# Patient Record
Sex: Female | Born: 1970 | Race: Black or African American | Hispanic: No | Smoking: Current every day smoker
Health system: Southern US, Community
[De-identification: ages and names within clinical notes are randomized; demographics above are authoritative.]

## PROBLEM LIST (undated history)

## (undated) DIAGNOSIS — F419 Anxiety disorder, unspecified: Secondary | ICD-10-CM

## (undated) DIAGNOSIS — I82409 Acute embolism and thrombosis of unspecified deep veins of unspecified lower extremity: Secondary | ICD-10-CM

## (undated) DIAGNOSIS — M549 Dorsalgia, unspecified: Secondary | ICD-10-CM

---

## 1998-01-23 ENCOUNTER — Emergency Department (HOSPITAL_COMMUNITY): Admission: EM | Admit: 1998-01-23 | Discharge: 1998-01-23 | Payer: Self-pay | Admitting: Emergency Medicine

## 1999-07-05 ENCOUNTER — Inpatient Hospital Stay (HOSPITAL_COMMUNITY): Admission: AD | Admit: 1999-07-05 | Discharge: 1999-07-05 | Payer: Self-pay | Admitting: Obstetrics & Gynecology

## 1999-07-22 ENCOUNTER — Emergency Department (HOSPITAL_COMMUNITY): Admission: EM | Admit: 1999-07-22 | Discharge: 1999-07-22 | Payer: Self-pay | Admitting: Emergency Medicine

## 2000-05-27 ENCOUNTER — Ambulatory Visit (HOSPITAL_COMMUNITY): Admission: RE | Admit: 2000-05-27 | Discharge: 2000-05-27 | Payer: Self-pay | Admitting: Family Medicine

## 2000-05-27 ENCOUNTER — Inpatient Hospital Stay (HOSPITAL_COMMUNITY): Admission: AD | Admit: 2000-05-27 | Discharge: 2000-05-27 | Payer: Self-pay | Admitting: Obstetrics

## 2000-05-29 ENCOUNTER — Inpatient Hospital Stay (HOSPITAL_COMMUNITY): Admission: AD | Admit: 2000-05-29 | Discharge: 2000-05-29 | Payer: Self-pay | Admitting: Obstetrics & Gynecology

## 2000-06-02 ENCOUNTER — Encounter: Payer: Self-pay | Admitting: Obstetrics & Gynecology

## 2000-06-02 ENCOUNTER — Inpatient Hospital Stay (HOSPITAL_COMMUNITY): Admission: AD | Admit: 2000-06-02 | Discharge: 2000-06-02 | Payer: Self-pay | Admitting: Obstetrics & Gynecology

## 2000-06-07 ENCOUNTER — Inpatient Hospital Stay (HOSPITAL_COMMUNITY): Admission: AD | Admit: 2000-06-07 | Discharge: 2000-06-07 | Payer: Self-pay | Admitting: Obstetrics

## 2000-06-09 ENCOUNTER — Inpatient Hospital Stay (HOSPITAL_COMMUNITY): Admission: AD | Admit: 2000-06-09 | Discharge: 2000-06-09 | Payer: Self-pay | Admitting: *Deleted

## 2000-06-09 ENCOUNTER — Encounter: Payer: Self-pay | Admitting: *Deleted

## 2000-06-11 ENCOUNTER — Observation Stay (HOSPITAL_COMMUNITY): Admission: AD | Admit: 2000-06-11 | Discharge: 2000-06-12 | Payer: Self-pay | Admitting: Obstetrics & Gynecology

## 2000-06-15 ENCOUNTER — Inpatient Hospital Stay (HOSPITAL_COMMUNITY): Admission: AD | Admit: 2000-06-15 | Discharge: 2000-06-15 | Payer: Self-pay | Admitting: Obstetrics

## 2001-02-03 ENCOUNTER — Ambulatory Visit (HOSPITAL_COMMUNITY): Admission: RE | Admit: 2001-02-03 | Discharge: 2001-02-03 | Payer: Self-pay | Admitting: Family Medicine

## 2001-02-03 ENCOUNTER — Encounter: Payer: Self-pay | Admitting: Family Medicine

## 2001-03-16 ENCOUNTER — Emergency Department (HOSPITAL_COMMUNITY): Admission: EM | Admit: 2001-03-16 | Discharge: 2001-03-16 | Payer: Self-pay | Admitting: Emergency Medicine

## 2001-05-18 ENCOUNTER — Ambulatory Visit (HOSPITAL_COMMUNITY): Admission: RE | Admit: 2001-05-18 | Discharge: 2001-05-18 | Payer: Self-pay

## 2001-08-20 ENCOUNTER — Emergency Department (HOSPITAL_COMMUNITY): Admission: EM | Admit: 2001-08-20 | Discharge: 2001-08-21 | Payer: Self-pay

## 2001-08-25 ENCOUNTER — Encounter: Payer: Self-pay | Admitting: Emergency Medicine

## 2001-08-25 ENCOUNTER — Emergency Department (HOSPITAL_COMMUNITY): Admission: EM | Admit: 2001-08-25 | Discharge: 2001-08-25 | Payer: Self-pay | Admitting: Emergency Medicine

## 2003-01-08 ENCOUNTER — Emergency Department (HOSPITAL_COMMUNITY): Admission: EM | Admit: 2003-01-08 | Discharge: 2003-01-08 | Payer: Self-pay | Admitting: Emergency Medicine

## 2003-02-17 ENCOUNTER — Emergency Department (HOSPITAL_COMMUNITY): Admission: AD | Admit: 2003-02-17 | Discharge: 2003-02-17 | Payer: Self-pay | Admitting: Emergency Medicine

## 2003-02-17 ENCOUNTER — Encounter: Payer: Self-pay | Admitting: Emergency Medicine

## 2004-03-14 ENCOUNTER — Emergency Department (HOSPITAL_COMMUNITY): Admission: EM | Admit: 2004-03-14 | Discharge: 2004-03-14 | Payer: Self-pay | Admitting: Family Medicine

## 2004-03-25 ENCOUNTER — Emergency Department (HOSPITAL_COMMUNITY): Admission: EM | Admit: 2004-03-25 | Discharge: 2004-03-25 | Payer: Self-pay | Admitting: Family Medicine

## 2006-04-01 ENCOUNTER — Emergency Department (HOSPITAL_COMMUNITY): Admission: EM | Admit: 2006-04-01 | Discharge: 2006-04-01 | Payer: Self-pay | Admitting: *Deleted

## 2015-05-03 ENCOUNTER — Emergency Department (HOSPITAL_COMMUNITY)
Admission: EM | Admit: 2015-05-03 | Discharge: 2015-05-03 | Disposition: A | Discharge status: Home / Self Care | Payer: Self-pay | Attending: Emergency Medicine | Admitting: Emergency Medicine

## 2015-05-03 ENCOUNTER — Encounter (HOSPITAL_COMMUNITY): Payer: Self-pay | Admitting: Emergency Medicine

## 2015-05-03 DIAGNOSIS — M5117 Intervertebral disc disorders with radiculopathy, lumbosacral region: Secondary | ICD-10-CM | POA: Insufficient documentation

## 2015-05-03 DIAGNOSIS — Z720 Tobacco use: Secondary | ICD-10-CM | POA: Insufficient documentation

## 2015-05-03 DIAGNOSIS — M5116 Intervertebral disc disorders with radiculopathy, lumbar region: Secondary | ICD-10-CM

## 2015-05-03 DIAGNOSIS — R45851 Suicidal ideations: Secondary | ICD-10-CM | POA: Insufficient documentation

## 2015-05-03 HISTORY — DX: Anxiety disorder, unspecified: F41.9

## 2015-05-03 MED ORDER — CYCLOBENZAPRINE HCL 10 MG PO TABS
10.0000 mg | ORAL_TABLET | Freq: Three times a day (TID) | ORAL | Status: DC | PRN
Start: 2015-05-03 — End: 2016-11-11

## 2015-05-03 MED ORDER — CYCLOBENZAPRINE HCL 10 MG PO TABS
5.0000 mg | ORAL_TABLET | Freq: Once | ORAL | Status: AC
  Administered 2015-05-03: 5 mg via ORAL
  Filled 2015-05-03: qty 1

## 2015-05-03 MED ORDER — PREDNISONE 20 MG PO TABS: 20.0000 mg | ORAL_TABLET | Freq: Every day | ORAL | Status: DC

## 2015-05-03 MED ORDER — KETOROLAC TROMETHAMINE 60 MG/2ML IM SOLN
60.0000 mg | Freq: Once | INTRAMUSCULAR | Status: AC
  Administered 2015-05-03: 60 mg via INTRAMUSCULAR
  Filled 2015-05-03: qty 2

## 2015-05-03 MED ORDER — HYDROMORPHONE HCL 1 MG/ML IJ SOLN
1.0000 mg | Freq: Once | INTRAMUSCULAR | Status: AC
  Administered 2015-05-03: 1 mg via INTRAVENOUS
  Filled 2015-05-03: qty 1

## 2015-05-03 MED ORDER — TRAMADOL HCL 50 MG PO TABS: 50.0000 mg | ORAL_TABLET | Freq: Two times a day (BID) | ORAL | Status: DC | PRN

## 2015-05-03 NOTE — ED Notes (Signed)
From home via [facility] for chronic back pain, radiating down left leg, HR 130, 158/80  500 ml NS 150 mcg fentanyl

## 2015-05-03 NOTE — Discharge Instructions (Signed)
Herniated Disk °A herniated disk occurs when a disk in your spine bulges out too far. This condition is also called a ruptured disk or slipped disk. Your spine (backbone) is made up of bones called vertebrae. Between each pair of vertebrae is an oval disk with a soft, spongy center that acts as a shock absorber when you move. The spongy center is surrounded by a tough outer ring. °When you have a herniated disk, the spongy center of the disk bulges out or ruptures through the outer ring. A herniated disk can press on a nerve between your vertebrae and cause pain. A herniated disk can occur anywhere in your back or neck area, but the lower back is the most common spot. °CAUSES  °In many cases, a herniated disk occurs just from getting older. As you age, the spongy insides of your disks tend to shrink and dry out. A herniated disk can result from gradual wear and tear. Injury or sudden strain can also cause a herniated disk.  °RISK FACTORS °Aging is the main risk factor for a herniated disk. Other risk factors include: °· Being a man between the ages of 30 and 50 years. °· Having a job that requires heavy lifting, bending, or twisting. °· Having a job that requires long hours of driving. °· Not getting enough exercise. °· Being overweight. °· Smoking. °SIGNS AND SYMPTOMS  °Signs and symptoms depend on which disk is herniated. °· For a herniated disk in the lower back, you may have sharp pain in: °¨ One part of your leg, hip, or buttocks. °¨ The back of your calf. °¨ The top or sole of your foot (sciatica).   °· For a herniated disk in the neck, you may feel pain: °¨ When you move your neck. °¨ Near or over your shoulder blade. °¨ That moves to your upper arm, forearm, or fingers.   °· You may also have muscle weakness. It may be hard to: °¨ Lift your leg or arm. °¨ Stand on your toes. °¨ Squeeze tightly with one of your hands. °· Other symptoms can include: °¨ Numbness or tingling in the affected areas of your  body. °¨ Loss of bladder or bowel control. This is a rare but serious sign of a severe herniated disk in the lower back. °DIAGNOSIS  °Your health care provider will do a physical exam. During this exam, you may have to move certain body parts or assume various positions. For example, your health care provider may do the straight-leg test. This is a good way to test for a herniated disk in your lower back. In this test, the health care provider lifts your leg while you lie on your back. This is to see if you feel pain down your leg. Your health care provider will also check for numbness or loss of feeling. °· Your health care provider will also check your: °¨ Reflexes. °¨ Muscle strength. °¨ Posture. °· Other tests may be done to help in making a diagnosis. These may include: °¨ An X-ray of the spine to rule out other causes of back pain.   °¨ Other imaging studies, such as an MRI or CT scan. This is to check whether the herniated disk is pressing on your spinal canal. °¨ Electromyography (EMG). This test checks the nerves that control muscles. It is sometimes used to identify the specific area of nerve involvement.   °TREATMENT  °In many cases, herniated disk symptoms go away over a period of days or weeks. You will most   likely be free of symptoms in 3-4 months. Treatment may include the following: °· The initial treatment for a herniated disk is a short period of rest. °¨ Bed rest is often limited to 1 or 2 days. Resting for too long delays recovery. °¨ If you have a herniated disk in your lower back, you should avoid sitting as much as possible because sitting increases pressure on the disk. °· Medicines. These may include:   °¨ Nonsteroidal anti-inflammatory drugs (NSAIDs). °¨ Muscle relaxants for back spasms. °¨ Narcotic pain medicine if your pain is very bad.   °· Steroid injections. You may need these along the involved nerve root to help control pain. The steroid is injected in the area of the herniated disk.  It helps by reducing swelling around the disk. °· Physical therapy. This may include exercises to strengthen the muscles that help support your spine.   °· You may need surgery if other treatments do not work.   °HOME CARE INSTRUCTIONS °Follow all your health care provider's instructions. These may include: °· Take all medicines as directed by your health care provider. °· Rest for 2 days and then start moving. °¨ Do not sit or stand for long periods of time. °¨ Maintain good posture when sitting and standing. °¨ Avoid movements that cause pain, such as bending or lifting. °· When you are able to start lifting things again: °¨ Bend with your knees. °¨ Keep your back straight. °¨ Hold heavy objects close to your body. °· If you are overweight, ask your health care provider to help you start a weight-loss program. °· When you are able to start exercising, ask your health care provider how much and what type of exercise is best for you. °· Work with a physical therapist on stretching and strengthening exercises for your back. °· Do not wear high-heeled shoes. °· Do not sleep on your belly. °· Do not smoke. °· Keep all follow-up visits as directed by your health care provider. °SEEK MEDICAL CARE IF: °· You have back or neck pain that is not getting better after 4 weeks. °· You have very bad pain in your back or neck. °· You develop numbness, tingling, or weakness along with pain. °SEEK IMMEDIATE MEDICAL CARE IF:  °· You have numbness, tingling, or weakness that makes you unable to use your arms or legs. °· You lose control of your bladder or bowels. °· You have dizziness or fainting. °· You have shortness of breath.   °MAKE SURE YOU:  °· Understand these instructions. °· Will watch your condition. °· Will get help right away if you are not doing well or get worse. °Document Released: 08/08/2000 Document Revised: 12/26/2013 Document Reviewed: 07/15/2013 °ExitCare® Patient Information ©2015 [employer]. This information  is not intended to replace advice given to you by your health care provider. Make sure you discuss any questions you have with your health care provider. ° ° ° ° °Emergency Department Resource Guide °1) Find a Doctor and Pay Out of Pocket °Although you won't have to find out who is covered by your insurance plan, it is a good idea to ask around and get recommendations. You will then need to call the office and see if the doctor you have chosen will accept you as a new patient and what types of options they offer for patients who are self-pay. Some doctors offer discounts or will set up payment plans for their patients who do not have insurance, but you will need to ask so you aren't surprised   when you get to your appointment. ° °2) Contact Your Local Health Department °Not all health departments have doctors that can see patients for sick visits, but many do, so it is worth a call to see if yours does. If you don't know where your local health department is, you can check in your phone book. The CDC also has a tool to help you locate your state's health department, and many state websites also have listings of all of their local health departments. ° °3) Find a Walk-in Clinic °If your illness is not likely to be very severe or complicated, you may want to try a walk in clinic. These are popping up all over the country in pharmacies, drugstores, and shopping centers. They're usually staffed by nurse practitioners or physician assistants that have been trained to treat common illnesses and complaints. They're usually fairly quick and inexpensive. However, if you have serious medical issues or chronic medical problems, these are probably not your best option. ° °No Primary Care Doctor: °- Call [facility] at  832-8000 - they can help you locate a primary care doctor that  accepts your insurance, provides certain services, etc. °- Physician Referral Service- 1-800-533-3463 ° °Chronic Pain Problems: °Organization          Address  Phone   Notes  °[facility]  (336) 297-2271 Patients need to be referred by their primary care doctor.  ° °Medication Assistance: °Organization         Address  Phone   Notes  °[place] [facility] [address] °[place], [place] [address] (336) 641-8030 --Must be a resident of [place] °-- Must have NO insurance coverage whatsoever (no Medicaid/ Medicare, etc.) °-- The pt. MUST have a primary care doctor that directs their care regularly and follows them in the community °  °[facility]  (866) 331-1348   °[employer]  (888) 892-1162   ° °Agencies that provide inexpensive medical care: °Organization         Address  Phone   Notes  °[facility]  (336) 832-8035   °[facility]    (336) 832-7272   °[facility] [address] °[place], [place] [address] (336) 832-4777   °[facility] [address], °[place] (336) 271-4999   °[facility]    (336) 373-0678   °[facility]    (336) 272-1050   °[facility] ° [address], [place] Phone:  (336) 832-4444, Fax:  (336) 832-4440 Hours of Operation:  9 am - 6 pm, M-F.  Also accepts Medicaid/Medicare and self-pay.  °[facility] ° [address], [place] Phone: (336) 832-3150, Fax: (336) 832-3151. Hours of Operation:  8:30 am - 5:30 pm, M-F.  Also accepts Medicaid and self-pay.  °[facility] [place] [address], [place] Phone: (336) 878-6027   °[facility] [address], [place], [place] (336)723-1848, Ext. 123 Mondays & Thursdays: 7-9 AM.  First 15 patients are seen on a first come, first serve basis. °  ° °Medicaid-accepting [place] Providers: ° °Organization         Address  Phone   Notes  °[facility] [address], [place] (336) 641-2100 Also accepts self-pay patients.   °[facility] [address],  ° (336) 856-9996   °[facility] [address], [place] (336) 288-8857   °[facility] [address] [place] [address], [place] (336) 299-7000   °Veita Bland [address], [place]  ° (336) 373-1557 Only accepts [place] Access Medicaid patients after they have their name applied to their card.  ° °Self-Pay (no insurance) in [place]: ° °Organization         Address  Phone   Notes  °Sickle Cell Patients, [facility] [address], [place] (336) 832-1970   °[facility] [address], [place] (336) 832-4400   °[facility] ° [address], [place] (336) 992-4800   °[facility]/Dr. Osei-Bonsu ° [address] [place] [address], [place] or [address], [place] (336) 841-8500 Phone number for both [place] and [place] locations is the same.  °[facility] [address], [place] (336) 299-0000   °[facility] [place] [address] [place] [address], [place] or [address] (336) 852-7530 °(336) 878-2260   °[facility] [address], [place] (336) 350-1642, phone; (336) 294-5005, fax Sees patients 1st and 3rd Saturday of every month.  Must not qualify for public or private insurance (i.e. Medicaid, Medicare, [place] Health Choice, Veterans' Benefits) • Household income should be no more than 200% of the poverty level •The clinic cannot treat you if you are pregnant or think you are pregnant • Sexually transmitted diseases are not treated at the clinic.  ° ° °Dental Care: °Organization         Address  Phone  Notes  °[facility] [facility] [address], [place] (336) 641-6152 Accepts children up to age 21 who are enrolled in Medicaid or [place] Health Choice; pregnant women with a Medicaid  card; and children who have applied for Medicaid or [place] Health Choice, but were declined, whose parents can pay a reduced fee at time of service.  °[facility] [place]  [address], [place] (336) 641-7733 Accepts children up to age 21 who are enrolled in Medicaid or [place] Health Choice; pregnant women with a Medicaid card; and children who have applied for Medicaid or [place] Health Choice, but were declined, whose parents can pay a reduced fee at time of service.  °[facility] ° [address],  (336) 641-4533 Patients are seen by appointment only. Walk-ins are not accepted. [facility] will see patients 18 years of age and older. °Monday - Tuesday (8am-5pm) °Most Wednesdays (8:30-5pm) °$30 per visit, cash only  °[facility] ° [address], [place] (336) 641-4533 Patients are seen by appointment only. Walk-ins are not accepted. [facility] will see patients 18 years of age and older. °One Wednesday Evening (Monthly: Volunteer Based).  $30 per visit, cash only  °[facility]  (919) 537-3737 for adults; Children under age 4, call Graduate Pediatric Dentistry at (919) [phone]. Children aged 4-14, please call (919) [phone] to request a pediatric application. ° Dental services are provided in all areas of dental care including fillings, crowns and bridges, complete and partial dentures, implants, gum treatment, root canals, and extractions. Preventive care is also provided. Treatment is provided to both adults and children. °Patients are selected via a lottery and there is often a waiting list. °  °[facility] [address], °[place] ° (336) 763-8833 [email or web address] °  °[facility] [address], [place], [place] (336)723-1848, Ext. 123 Second and Fourth Thursday of each month, opens at 6:30 AM; Clinic ends at 9 AM.  Patients are seen on a first-come  first-served basis, and a limited number are seen during each clinic.  ° °[facility] ° [address], [place], [place] (336) 723-7904   Eligibility Requirements °You must have lived in [place], [place], or [place] counties for at least the last three months. °  You cannot be eligible for state or federal sponsored healthcare insurance, including Veterans Administration, Medicaid, or Medicare. °  You generally cannot be eligible for healthcare insurance through your employer.  °  How to apply: °Eligibility screenings are held every Tuesday and Wednesday afternoon from 1:00 pm until 4:00 pm. You do not need an appointment for the interview!  °[facility] [address], [place], [place] 336-631-2330   °[facility]  336-342-8273   °[facility]  336-703-3100   °[facility]  336-570-6415   ° °Behavioral Health Resources in the Community: °Intensive Outpatient Programs °Organization         Address  Phone  Notes  °[facility] [address], [place], [place] 336-878-6098   °[facility] [address], [place], [place] 336-832-9800   °[facility] [address], [place], [place] ° 336-882-2125   °[facility] [address],  °[place], [place] 1-800-853-5163 or 336-641-4981   °Substance Abuse Resources °Organization         Address  Phone  Notes  °[facility]  336-882-2125   °[facility]  336-784-9470   °[facility]  336-285-9073   °[facility]  336-845-3988   °Residential & Outpatient Substance Abuse Program  1-800-659-3381   °Psychological Services °Organization         Address  Phone  Notes  °[facility]  336- 832-9600   °[facility]  336- 378-7881   °[facility] [address], [place] 1-800-853-5163 or 336-641-4981   ° °Mobile Crisis Teams °Organization          Address  Phone  Notes  °[facility], Mobile Crisis Care Unit  1-877-626-1772   °Assertive °[facility] ° [address] [place], [place] 336-834-9664   °Sharon DeEsch [address] °[place] 336-554-5454   ° °Self-Help/Support Groups °Organization         Address  Phone             Notes  °[facility] [place] - variety of support groups  336- 373-1402 Call for more information  °Narcotics Anonymous (NA), [facility] [address], °[place]  2 meetings at this location  ° °Residential Treatment Programs °Organization         Address  Phone  Notes  °[facility] [address],    °[place]  1-866-801-8205   °[facility] ° [address], [place], [place] 704-293-8524   °[facility] [address], [place] 336-845-3988 Admissions: 8am-3pm M-F  °[facility] [address]    °[place], [place] 336-841-1104   °[facility] [address], , [place] 336-379-7146   °[facility] [address]  °  [place], [place] 336-285-9073   °[facility] Intensive Outpatient [address], [place], [place] 336-852-3033   °[facility] [address]  °[place], [place] 1-877-615-2722 or 336-784-9470   °[facility] [address] [place], [place] 336-227-7417 Accepts Medicaid  °[facility] [address]  °[place] 1-800-659-3381 Substance Abuse/Addiction Treatment  ° °[place] Behavioral Health Resources °Organization         Address  Phone  Notes  °[facility]  (888) 581-9988   °Julie Brannon, PhD [address] [place], [place]   (336) 349-5553 or (336) 951-0000   °[facility]   [address] °[place], [place] (336) 349-4454   °[facility] [address], [place], [place] (336) 342-8316 Insurance/Medicaid/sponsorship  through [facility]  °[facility] [address]                                    [place], [place] (336) 342-8316 Therapy/tele-psych/case  °[facility] [address]  ° [place], [place] (336) 349-2233    °Dr. Arfeen  (336) 349-4544   °[facility] [place]  [employer] [place] [facility] 1) [address], [place] °2) [address], [place] °3)  [address], [place] (336) 349-3220 °(336) 342-7768 ° °(336) 342-8140   °[place] [facility] (336) 342-1394 or (336) 342-3537 (After Hours)    ° ° ° °

## 2015-05-03 NOTE — ED Provider Notes (Signed)
CSN: [id number]     Arrival date & time 05/03/15  1429 History   None    Chief Complaint  Patient presents with  . Back Pain  . Suicidal    HPI  Christina [name] with history of chronic lower back pain presenting with acutely worsened back pain and shooting pains down her left leg after bending over to clean out her kitty litter box. She was not able to walk since that time due to pain but she has been able to move her leg and has good sensation. She denies any fevers, IV drug abuse, recent weight loss, point-tenderness, dysuria, flank pain, bowel/bladder incontinence. Her lower back pain started 24 years ago after she got an epidural during her second child's birth.  She also endorses significant amount of stress at home, including caring for her grandmother, her brother is on house arrest and has been stealing from her, and she has not seen her pain specialist in the last 4 months because he went out of practice. She has had transient thoughts of harming herself but has never actually attempted nor has she had a specific plan. She denies suicidal ideation at this time, and says she is living for her four children and four grandchildren.  Past Medical History  Diagnosis Date  . Anxiety    History reviewed. No pertinent past surgical history. No family history on file. Social History  Substance Use Topics  . Smoking status: Current Every Day Smoker  . Smokeless tobacco: None  . Alcohol Use: No   OB History    No data available     Review of Systems  Constitutional: Negative for fever and chills.  Respiratory: Negative for chest tightness and shortness of breath.   Cardiovascular: Negative for chest pain, palpitations and leg swelling.  Gastrointestinal: Negative for nausea, vomiting, abdominal pain and diarrhea.  Genitourinary: Negative for dysuria and flank pain.  Musculoskeletal: Positive for back pain and gait problem. Negative for joint swelling,  arthralgias, neck pain and neck stiffness.  Skin: Negative for rash.  Neurological: Negative for dizziness, syncope, weakness and light-headedness.   Allergies  Review of patient's allergies indicates not on file.  Home Medications   Prior to Admission medications   Not on File   BP 142/84 mmHg  Pulse 111  Temp(Src) 98.4 F (36.9 C) (Oral)  Resp 20  Ht 5\' 6"  (1.676 m)  Wt 147 lb (66.679 kg)  BMI 23.74 kg/m2  SpO2 100%  LMP 04/19/2015 Physical Exam  Constitutional: She appears well-developed and well-nourished. She appears distressed.  HENT:  Head: Normocephalic and atraumatic.  Eyes: Conjunctivae and EOM are normal.  Neck: Normal range of motion. Neck supple. No JVD present.  Cardiovascular: Normal rate, regular rhythm, normal heart sounds and intact distal pulses.   No murmur heard. Pulmonary/Chest: Effort normal and breath sounds normal.  Abdominal: Soft. Bowel sounds are normal. She exhibits no distension. There is no tenderness.  Musculoskeletal:       Right hip: Normal.       Left hip: She exhibits decreased range of motion. She exhibits normal strength, no tenderness and no bony tenderness.       Left knee: Normal.       Left ankle: Normal.       Lumbar back: She exhibits pain. She exhibits normal range of motion, no tenderness and no bony tenderness.  Lymphadenopathy:    She has no cervical adenopathy.  Skin: She  is not diaphoretic.  Psychiatric: Her speech is normal and behavior is normal. Judgment and thought content normal. Her mood appears anxious. Cognition and memory are normal. She exhibits a depressed mood.   ED Course  Procedures (including critical care time)  Labs Review Labs Reviewed - No data to display  Imaging Review No results found. I have personally reviewed and evaluated these images and lab results as part of my medical decision-making.   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 is a 44 year old [name] presenting with acutely worsened chronic lower back pain after bending over with radiculopathy down her posterior right leg, consistent with radiculopathy, likely from a herniated disc. Her physical exam was consistent with this diagnosis given her positive straight leg raise on her right, with normal strength and sensation throughout reassuring there is no serious cord compromise. She has no other red flags on history of exam. For her pain I prescribed a short course of tramadol, prednisone, and cyclobenzaprine. I and referred her to [facility] to get insurance and subsequent outpatient care for her chronic pain. Regarding her psychiatric issues, she is clearly under a significant amount anxiety in the face of life stressors and chronic pain, and would benefit from seeing a psychiatrist on an outpatient basis. Although she has had transient suicidal thoughts, she is not suicidal at this time so I did not feel she needed psychiatric admission.    Christina Cooley, MD 05/03/15 1635  Christina Cooley, MD 05/03/15 1642  Christina Barrette, MD 05/03/15 [phone]

## 2016-04-06 ENCOUNTER — Emergency Department (HOSPITAL_COMMUNITY)
Admission: EM | Admit: 2016-04-06 | Discharge: 2016-04-06 | Disposition: A | Discharge status: Home / Self Care | Payer: Self-pay | Attending: Emergency Medicine | Admitting: Emergency Medicine

## 2016-04-06 ENCOUNTER — Encounter (HOSPITAL_COMMUNITY): Payer: Self-pay

## 2016-04-06 DIAGNOSIS — M545 Low back pain, unspecified: Secondary | ICD-10-CM

## 2016-04-06 DIAGNOSIS — G8929 Other chronic pain: Secondary | ICD-10-CM

## 2016-04-06 DIAGNOSIS — F17200 Nicotine dependence, unspecified, uncomplicated: Secondary | ICD-10-CM | POA: Insufficient documentation

## 2016-04-06 MED ORDER — METHOCARBAMOL 500 MG PO TABS: 1000.0000 mg | ORAL_TABLET | Freq: Four times a day (QID) | ORAL | 0 refills | Status: DC | PRN

## 2016-04-06 MED ORDER — ONDANSETRON 4 MG PO TBDP
4.0000 mg | ORAL_TABLET | Freq: Once | ORAL | Status: AC
  Administered 2016-04-06: 4 mg via ORAL
  Filled 2016-04-06: qty 1

## 2016-04-06 MED ORDER — HYDROCODONE-ACETAMINOPHEN 5-325 MG PO TABS: ORAL_TABLET | ORAL | 0 refills | Status: DC

## 2016-04-06 MED ORDER — METHOCARBAMOL 500 MG PO TABS
1000.0000 mg | ORAL_TABLET | Freq: Once | ORAL | Status: AC
Start: 2016-04-06 — End: 2016-04-06
  Administered 2016-04-06: 1000 mg via ORAL
  Filled 2016-04-06: qty 2

## 2016-04-06 MED ORDER — MORPHINE SULFATE (PF) 4 MG/ML IV SOLN
4.0000 mg | Freq: Once | INTRAVENOUS | Status: AC
  Administered 2016-04-06: 4 mg via INTRAMUSCULAR
  Filled 2016-04-06: qty 1

## 2016-04-06 NOTE — Discharge Instructions (Signed)
Please take ibuprofen 400mg (this is normally 2 over the counter pills) every 6 hours (take with food to minimze stomach irritation).  ° °Take robaxin and/or Vicodin for breakthrough pain, do not drink alcohol, drive, care for children or perfom other critical tasks while taking robaxin and/or Vicodin . ° °Please follow with your primary care doctor in the next 2 days for a check-up. They must obtain records for further management.  ° °Do not hesitate to return to the Emergency Department for any new, worsening or concerning symptoms.  ° °

## 2016-04-06 NOTE — ED Triage Notes (Addendum)
Patient complains of severe back pain x 4 days with radiation down legs with numbness to legs and feet, states that it started after bending over. States that she has chronic pain. States that she is unable to ambulate and had to crawl to restroom this am

## 2016-04-06 NOTE — Care Management Note (Signed)
Case Management Note  Patient Details  Name: Sonia Sideimuira Dewilde MRN: [id number] Date of Birth: Oct 25, 1970  Subjective/Objective:   45 y.o. F seen in the ED for back pain. CM consulted to assist with PCP and follow up care. Pt tells me she has been unable to work x 5 years and has been pursuing SS and disability.  Tearful as she spoke about her frustration and pain.                 Action/PlanCM spoke with pt who [place].  CM discussed and provided written information for self pay pcps, discussed the importance of pcp vs EDP services for f/u care, www.needymeds.org, www.goodrx.com, discounted pharmacies and other [place] resources such as  [facility], affordable care act,  [place] med assist, financial assistance, self pay dental services, [place] med assist, DSS and  health department  Reviewed resources for [place] self pay pcps like [facility], family medicine at [address], [facility] [place], [facility], local urgent care centers, [facility], [facility], general medical clinics, family services of the [place], [facility] care plus others, medication resources, [facility] out patient [facility]ies and housing. Pt voiced understanding and appreciation of resources provided   Provided [facility] contact information Pt agreed to a referral Cm completed referral. Pt to be contact by [facility] clinical liaison. Confirmed contact information Verbalized agreement to  [facility] for Open Clinic Hours to see PCP.   Expected Discharge Date:                  Expected Discharge Plan:  Home/Self Care  In-House Referral:     Discharge planning Services  CM Consult, Follow-up appt scheduled, [facility]  Post Acute Care Choice:  NA Choice offered to:  Patient  DME Arranged:  N/A DME Agency:     HH Arranged:  NA HH Agency:  NA  Status of Service:  Completed, signed off  If discussed at Long  Length of Stay Meetings, dates discussed:    Additional Comments:  Yvone NeuCrutchfield, Nilani Hugill M, RN 04/06/2016, 10:12 AM

## 2016-04-06 NOTE — ED Provider Notes (Signed)
[facility]-EMER[facility]ENCY DEPT Provider Note   CSN: [id number] Arrival date & time: 04/06/16  4098  First Provider Contact:  First MD Initiated Contact with Patient 04/06/16 [phone]        History   Chief Complaint Chief Complaint  Patient presents with  . Back Pain    HPI  Blood pressure (!) 165/101, pulse (!) 135, temperature 98.8 F (37.1 C), temperature source Oral, resp. rate 20, last menstrual period 03/30/2016, SpO2 100 %.  Christina [name] is a 45 y.o. female complaining of exacerbation of her chronic back pain which started 25 years ago after an epidural, pain is bandlike over the lower back and radiates down to the bilateral hips it goes down the right leg and she states most of the time her legs feel "like jelly." She was taking Vicodin she had to take 2 at a time and that was not helpful for her pain, the current exacerbation started yesterday when she bent over to pick up a piece of paper. She states that the pain was so severe she had to crawl to the restroom last night however, she was able to ambulate to her car this morning and drive herself to the emergency room. Patient denies fever, chills, incontinence, history of cancer, history of IV drug use, saddle anesthesia, numbness, weakness. She states that her pain is 10 out of 10 in typical for her pain exacerbations. Patient is also requesting help in applying for Social Security disability.  HPI  Past Medical History:  Diagnosis Date  . Anxiety     There are no active problems to display for this patient.   History reviewed. No pertinent surgical history.  OB History    No data available       Home Medications    Prior to Admission medications   Medication Sig Start Date End Date Taking? Authorizing Provider  acetaminophen (TYLENOL) 500 MG tablet Take 500 mg by mouth every 6 (six) hours as needed for mild pain or moderate pain.   Yes Historical Provider, MD  clonazePAM (KLONOPIN) 0.5 MG tablet Take 0.5 mg by mouth 2  (two) times daily as needed for anxiety.   Yes Historical Provider, MD  cetirizine (ZYRTEC) 10 MG tablet Take 10 mg by mouth daily as needed for allergies.    Historical Provider, MD  cyclobenzaprine (FLEXERIL) 10 MG tablet Take 1 tablet (10 mg total) by mouth 3 (three) times daily as needed for muscle spasms. 05/03/15   Selina Cooley, MD  HYDROcodone-acetaminophen (NORCO/VICODIN) 5-325 MG tablet Take 1-2 tablets by mouth every 6 hours as needed for pain and/or cough. 04/06/16   Jennylee Uehara, PA-C  methocarbamol (ROBAXIN) 500 MG tablet Take 2 tablets (1,000 mg total) by mouth 4 (four) times daily as needed (Pain). 04/06/16   Joni Reining Aubert Choyce, PA-C    Family History No family history on file.  Social History Social History  Substance Use Topics  . Smoking status: Current Every Day Smoker  . Smokeless tobacco: Never Used  . Alcohol use No     Allergies   Review of patient's allergies indicates no known allergies.   Review of Systems Review of Systems  10 systems reviewed and found to be negative, except as noted in the HPI.   Physical Exam Updated Vital Signs BP 116/79   Pulse 65   Temp 98.8 F (37.1 C) (Oral)   Resp 20   LMP 03/30/2016   SpO2 99%   Physical Exam  Constitutional: She appears well-developed and well-nourished.  HENT:  Head: Normocephalic.  Eyes: Conjunctivae are normal.  Neck: Normal range of motion.  Cardiovascular: Normal rate, regular rhythm and intact distal pulses.   Pulmonary/Chest: Effort normal.  Abdominal: Soft. There is no tenderness.  Neurological: She is alert.  No point tenderness to percussion of lumbar spinal processes.  No TTP or paraspinal muscular spasm. Strength is 5 out of 5 to bilateral lower extremities at hip and knee; extensor hallucis longus 5 out of 5. Ankle strength 5 out of 5, no clonus, neurovascularly intact. No saddle anaesthesia. Patellar reflexes are 2+ bilaterally.      Psychiatric: She has a normal mood and affect.    Nursing note and vitals reviewed.    ED Treatments / Results  Labs (all labs ordered are listed, but only abnormal results are displayed) Labs Reviewed - No data to display  EKG  EKG Interpretation None       Radiology No results found.  Procedures Procedures (including critical care time)  Medications Ordered in ED Medications  morphine 4 MG/ML injection 4 mg (4 mg Intramuscular Given 04/06/16 0931)  ondansetron (ZOFRAN-ODT) disintegrating tablet 4 mg (4 mg Oral Given 04/06/16 0932)  methocarbamol (ROBAXIN) tablet 1,000 mg (1,000 mg Oral Given 04/06/16 0932)     Initial Impression / Assessment and Plan / ED Course  I have reviewed the triage vital signs and the nursing notes.  Pertinent labs & imaging results that were available during my care of the patient were reviewed by me and considered in my medical decision making (see chart for details).  Clinical Course   Vitals:   04/06/16 0915 04/06/16 0930 04/06/16 1000 04/06/16 1015  BP: 114/87 115/76 124/85 116/79  Pulse: 80 70 75 65  Resp:      Temp:      TempSrc:      SpO2: 100% 100% 100% 99%    Medications  morphine 4 MG/ML injection 4 mg (4 mg Intramuscular Given 04/06/16 0931)  ondansetron (ZOFRAN-ODT) disintegrating tablet 4 mg (4 mg Oral Given 04/06/16 0932)  methocarbamol (ROBAXIN) tablet 1,000 mg (1,000 mg Oral Given 04/06/16 0932)    Christina [name] is 45 y.o. female presenting with Exacerbation of chronic low back pain which she's had for greater than 25 years, patient states that the back pain is not changing, it is constant in character and severity. It does radiate down the right leg, she always states that she feels like her legs are weak. She did have weakness yesterday and stated that she had to crawl however she is ambulatory today. Initially, patient inserted and tachycardic however on my exam she was neither on the monitor. Neurologic exam without abnormality, no red flags. Patient will be given IM  morphine, Robaxin and I will consult case management to help her establish primary care. Patient has produced a large folder with information on her Social Security disability, she is requesting help to obtain disability and I have explained to her that we cannot do this out of the emergency department.  Patient has ambulated, will be written short prescription for pain medication at home. Resource guide given.  Evaluation does not show pathology that would require ongoing emergent intervention or inpatient treatment. Pt is hemodynamically stable and mentating appropriately. Discussed findings and plan with patient/guardian, who agrees with care plan. All questions answered. Return precautions discussed and outpatient follow up given.      Final Clinical Impressions(s) / ED Diagnoses   Final diagnoses:  Acute exacerbation of chronic low back pain  New Prescriptions Discharge Medication List as of 04/06/2016 10:01 AM    START taking these medications   Details  HYDROcodone-acetaminophen (NORCO/VICODIN) 5-325 MG tablet Take 1-2 tablets by mouth every 6 hours as needed for pain and/or cough., Print    methocarbamol (ROBAXIN) 500 MG tablet Take 2 tablets (1,000 mg total) by mouth 4 (four) times daily as needed (Pain)., Starting Sun 04/06/2016, [employer]         Cosme Jacob, PA-C 04/06/16 1056    Pricilla LovelessScott Goldston, MD 04/12/16 0030

## 2016-11-03 ENCOUNTER — Encounter (HOSPITAL_COMMUNITY)
Admission: EM | Disposition: A | Discharge status: Home / Self Care | Payer: Self-pay | Source: Home / Self Care | Attending: Vascular Surgery

## 2016-11-03 ENCOUNTER — Inpatient Hospital Stay (HOSPITAL_COMMUNITY)
Admission: EM | Admit: 2016-11-03 | Discharge: 2016-11-07 | DRG: 271 | Disposition: A | Discharge status: Home / Self Care | Payer: Self-pay | Attending: Vascular Surgery | Admitting: Vascular Surgery

## 2016-11-03 ENCOUNTER — Encounter (HOSPITAL_COMMUNITY): Payer: Self-pay | Admitting: Emergency Medicine

## 2016-11-03 ENCOUNTER — Emergency Department (HOSPITAL_COMMUNITY): Payer: Self-pay

## 2016-11-03 ENCOUNTER — Emergency Department (HOSPITAL_BASED_OUTPATIENT_CLINIC_OR_DEPARTMENT_OTHER)
Admit: 2016-11-03 | Discharge: 2016-11-03 | Disposition: A | Discharge status: Home / Self Care | Payer: Self-pay | Attending: Emergency Medicine | Admitting: Emergency Medicine

## 2016-11-03 DIAGNOSIS — I82402 Acute embolism and thrombosis of unspecified deep veins of left lower extremity: Secondary | ICD-10-CM

## 2016-11-03 DIAGNOSIS — I82412 Acute embolism and thrombosis of left femoral vein: Principal | ICD-10-CM | POA: Diagnosis present

## 2016-11-03 DIAGNOSIS — M7989 Other specified soft tissue disorders: Secondary | ICD-10-CM

## 2016-11-03 DIAGNOSIS — F419 Anxiety disorder, unspecified: Secondary | ICD-10-CM | POA: Diagnosis present

## 2016-11-03 DIAGNOSIS — I82409 Acute embolism and thrombosis of unspecified deep veins of unspecified lower extremity: Secondary | ICD-10-CM | POA: Diagnosis present

## 2016-11-03 DIAGNOSIS — M545 Low back pain: Secondary | ICD-10-CM | POA: Diagnosis present

## 2016-11-03 DIAGNOSIS — G8929 Other chronic pain: Secondary | ICD-10-CM | POA: Diagnosis present

## 2016-11-03 DIAGNOSIS — F17200 Nicotine dependence, unspecified, uncomplicated: Secondary | ICD-10-CM | POA: Diagnosis present

## 2016-11-03 DIAGNOSIS — R000 Tachycardia, unspecified: Secondary | ICD-10-CM | POA: Diagnosis present

## 2016-11-03 DIAGNOSIS — I82422 Acute embolism and thrombosis of left iliac vein: Secondary | ICD-10-CM | POA: Diagnosis present

## 2016-11-03 DIAGNOSIS — I871 Compression of vein: Secondary | ICD-10-CM | POA: Diagnosis present

## 2016-11-03 HISTORY — PX: INTRAVASCULAR ULTRASOUND/IVUS: CATH118244

## 2016-11-03 HISTORY — PX: PERIPHERAL VASCULAR THROMBECTOMY: CATH118306

## 2016-11-03 HISTORY — DX: Dorsalgia, unspecified: M54.9

## 2016-11-03 LAB — BASIC METABOLIC PANEL
Anion gap: 11 (ref 5–15)
BUN: 10 mg/dL (ref 6–20)
CHLORIDE: 102 mmol/L (ref 101–111)
CO2: 23 mmol/L (ref 22–32)
CREATININE: 0.82 mg/dL (ref 0.44–1.00)
Calcium: 9.2 mg/dL (ref 8.9–10.3)
GFR calc Af Amer: >60 mL/min (ref >60)
GFR calc non Af Amer: >60 mL/min (ref >60)
GLUCOSE: 134 mg/dL — AB (ref 65–99)
Potassium: 3.6 mmol/L (ref 3.5–5.1)
Sodium: 136 mmol/L (ref 135–145)

## 2016-11-03 LAB — CBC WITH DIFFERENTIAL/PLATELET
Basophils Absolute: 0 10*3/uL (ref 0.0–0.1)
Basophils Relative: 0 %
Eosinophils Absolute: 0.1 10*3/uL (ref 0.0–0.7)
Eosinophils Relative: 1 %
HEMATOCRIT: 43.4 % (ref 36.0–46.0)
HEMOGLOBIN: 15 g/dL (ref 12.0–15.0)
LYMPHS ABS: 1.7 10*3/uL (ref 0.7–4.0)
Lymphocytes Relative: 19 %
MCH: 29.1 pg (ref 26.0–34.0)
MCHC: 34.6 g/dL (ref 30.0–36.0)
MCV: 84.3 fL (ref 78.0–100.0)
MONO ABS: 0.8 10*3/uL (ref 0.1–1.0)
MONOS PCT: 9 %
NEUTROS ABS: 5.9 10*3/uL (ref 1.7–7.7)
NEUTROS PCT: 71 %
Platelets: 192 10*3/uL (ref 150–400)
RBC: 5.15 MIL/uL — ABNORMAL HIGH (ref 3.87–5.11)
RDW: 13.9 % (ref 11.5–15.5)
WBC: 8.5 10*3/uL (ref 4.0–10.5)

## 2016-11-03 LAB — I-STAT TROPONIN, ED: Troponin i, poc: 0 ng/mL (ref 0.00–0.08)

## 2016-11-03 LAB — CBC
HCT: 37.2 % (ref 36.0–46.0)
HCT: 36.3 % (ref 36.0–46.0)
Hemoglobin: 12.4 g/dL (ref 12.0–15.0)
Hemoglobin: 12.1 g/dL (ref 12.0–15.0)
MCH: 28.2 pg (ref 26.0–34.0)
MCH: 28.3 pg (ref 26.0–34.0)
MCHC: 33.3 g/dL (ref 30.0–36.0)
MCHC: 33.3 g/dL (ref 30.0–36.0)
MCV: 84.7 fL (ref 78.0–100.0)
MCV: 84.8 fL (ref 78.0–100.0)
PLATELETS: 125 10*3/uL — AB (ref 150–400)
Platelets: 163 10*3/uL (ref 150–400)
RBC: 4.39 MIL/uL (ref 3.87–5.11)
RBC: 4.28 MIL/uL (ref 3.87–5.11)
RDW: 13.6 % (ref 11.5–15.5)
RDW: 13.7 % (ref 11.5–15.5)
WBC: 7.7 10*3/uL (ref 4.0–10.5)
WBC: 7.1 10*3/uL (ref 4.0–10.5)

## 2016-11-03 LAB — SURGICAL PCR SCREEN
MRSA, PCR: NEGATIVE
Staphylococcus aureus: NEGATIVE

## 2016-11-03 LAB — HEPARIN LEVEL (UNFRACTIONATED)
HEPARIN UNFRACTIONATED: 0.42 [IU]/mL (ref 0.30–0.70)
HEPARIN UNFRACTIONATED: 0.45 [IU]/mL (ref 0.30–0.70)

## 2016-11-03 LAB — FIBRINOGEN
FIBRINOGEN: 379 mg/dL (ref 210–475)
FIBRINOGEN: 224 mg/dL (ref 210–475)

## 2016-11-03 SURGERY — INTRAVASCULAR ULTRASOUND/IVUS

## 2016-11-03 MED ORDER — HYDROMORPHONE HCL 2 MG/ML IJ SOLN
0.5000 mg | Freq: Once | INTRAMUSCULAR | Status: AC
  Administered 2016-11-03: 0.5 mg via INTRAVENOUS
  Filled 2016-11-03: qty 1

## 2016-11-03 MED ORDER — CLONIDINE HCL 0.2 MG PO TABS
0.2000 mg | ORAL_TABLET | ORAL | Status: DC | PRN
Start: 2016-11-03 — End: 2016-11-07

## 2016-11-03 MED ORDER — SODIUM CHLORIDE 0.9 % IV SOLN: 250.0000 mL | INTRAVENOUS | Status: DC | PRN

## 2016-11-03 MED ORDER — IOPAMIDOL (ISOVUE-370) INJECTION 76%
INTRAVENOUS | Status: AC
  Administered 2016-11-03: 100 mL
  Filled 2016-11-03: qty 100

## 2016-11-03 MED ORDER — LIDOCAINE HCL (PF) 1 % IJ SOLN
INTRAMUSCULAR | Status: AC
  Filled 2016-11-03: qty 30

## 2016-11-03 MED ORDER — SODIUM CHLORIDE 0.9 % IR SOLN
Status: DC
  Filled 2016-11-03: qty 100

## 2016-11-03 MED ORDER — MIDAZOLAM HCL 2 MG/2ML IJ SOLN
INTRAMUSCULAR | Status: AC
  Filled 2016-11-03: qty 2

## 2016-11-03 MED ORDER — MIDAZOLAM HCL 2 MG/2ML IJ SOLN
INTRAMUSCULAR | Status: DC | PRN
  Administered 2016-11-03: 1 mg via INTRAVENOUS

## 2016-11-03 MED ORDER — LIDOCAINE HCL (PF) 1 % IJ SOLN
INTRAMUSCULAR | Status: DC | PRN
  Administered 2016-11-03: 15 mL via SUBCUTANEOUS

## 2016-11-03 MED ORDER — HEPARIN SODIUM (PORCINE) 1000 UNIT/ML IJ SOLN
INTRAMUSCULAR | Status: AC
  Filled 2016-11-03: qty 1

## 2016-11-03 MED ORDER — FENTANYL CITRATE (PF) 100 MCG/2ML IJ SOLN
INTRAMUSCULAR | Status: DC | PRN
  Administered 2016-11-03: 50 ug via INTRAVENOUS

## 2016-11-03 MED ORDER — METOPROLOL TARTRATE 5 MG/5ML IV SOLN
5.0000 mg | Freq: Four times a day (QID) | INTRAVENOUS | Status: DC
  Administered 2016-11-03 – 2016-11-06 (×7): 5 mg via INTRAVENOUS
  Filled 2016-11-03 (×6): qty 5

## 2016-11-03 MED ORDER — MIDAZOLAM HCL 2 MG/2ML IJ SOLN: 1.0000 mg | INTRAMUSCULAR | Status: DC | PRN

## 2016-11-03 MED ORDER — HEPARIN BOLUS VIA INFUSION
3000.0000 [IU] | Freq: Once | INTRAVENOUS | Status: AC
  Administered 2016-11-03: 3000 [IU] via INTRAVENOUS
  Filled 2016-11-03: qty 3000

## 2016-11-03 MED ORDER — ONDANSETRON HCL 4 MG/2ML IJ SOLN
4.0000 mg | Freq: Once | INTRAMUSCULAR | Status: AC
  Administered 2016-11-03: 4 mg via INTRAVENOUS
  Filled 2016-11-03: qty 2

## 2016-11-03 MED ORDER — ONDANSETRON HCL 4 MG/2ML IJ SOLN
4.0000 mg | Freq: Four times a day (QID) | INTRAMUSCULAR | Status: DC | PRN
  Administered 2016-11-03: 4 mg via INTRAVENOUS
  Filled 2016-11-03: qty 2

## 2016-11-03 MED ORDER — METHOCARBAMOL 500 MG PO TABS
1000.0000 mg | ORAL_TABLET | Freq: Four times a day (QID) | ORAL | Status: DC | PRN
  Administered 2016-11-04 – 2016-11-06 (×3): 1000 mg via ORAL
  Filled 2016-11-03 (×4): qty 2

## 2016-11-03 MED ORDER — HEPARIN (PORCINE) IN NACL 100-0.45 UNIT/ML-% IJ SOLN: 500.0000 [IU]/h | INTRAMUSCULAR | Status: DC

## 2016-11-03 MED ORDER — MORPHINE SULFATE (PF) 4 MG/ML IV SOLN
5.0000 mg | INTRAVENOUS | Status: DC | PRN
  Administered 2016-11-03: 4 mg via INTRAVENOUS
  Filled 2016-11-03: qty 2

## 2016-11-03 MED ORDER — CYCLOBENZAPRINE HCL 10 MG PO TABS
10.0000 mg | ORAL_TABLET | Freq: Three times a day (TID) | ORAL | Status: DC | PRN
  Administered 2016-11-04 – 2016-11-05 (×2): 10 mg via ORAL
  Filled 2016-11-03 (×5): qty 1

## 2016-11-03 MED ORDER — SODIUM CHLORIDE 0.9 % IV SOLN
1.0000 mg/h | INTRAVENOUS | Status: DC
  Administered 2016-11-03: 1 mg/h
  Filled 2016-11-03 (×3): qty 10

## 2016-11-03 MED ORDER — SODIUM CHLORIDE 0.9 % IV SOLN
0.2500 mg/h | INTRAVENOUS | Status: DC
  Administered 2016-11-03: 1 mg/h
  Filled 2016-11-03 (×3): qty 10

## 2016-11-03 MED ORDER — MORPHINE SULFATE (PF) 4 MG/ML IV SOLN
4.0000 mg | Freq: Once | INTRAVENOUS | Status: AC
  Administered 2016-11-03: 4 mg via INTRAVENOUS
  Filled 2016-11-03: qty 1

## 2016-11-03 MED ORDER — HEPARIN (PORCINE) IN NACL 2-0.9 UNIT/ML-% IJ SOLN
INTRAMUSCULAR | Status: AC
  Filled 2016-11-03: qty 1000

## 2016-11-03 MED ORDER — OXYCODONE-ACETAMINOPHEN 5-325 MG PO TABS
1.0000 | ORAL_TABLET | Freq: Four times a day (QID) | ORAL | Status: DC | PRN
  Administered 2016-11-03 (×2): 1 via ORAL
  Administered 2016-11-04 – 2016-11-05 (×2): 2 via ORAL
  Administered 2016-11-05: 1 via ORAL
  Administered 2016-11-06 (×2): 2 via ORAL
  Filled 2016-11-03 (×3): qty 1
  Filled 2016-11-03 (×2): qty 2
  Filled 2016-11-03: qty 1
  Filled 2016-11-03: qty 2

## 2016-11-03 MED ORDER — HEPARIN SODIUM (PORCINE) 1000 UNIT/ML IJ SOLN
INTRAMUSCULAR | Status: DC | PRN
  Administered 2016-11-03: 4000 [IU] via INTRAVENOUS

## 2016-11-03 MED ORDER — SODIUM CHLORIDE 0.9 % IV SOLN
INTRAVENOUS | Status: DC | PRN
  Administered 2016-11-03: 125 mL/h via INTRAVENOUS

## 2016-11-03 MED ORDER — HEPARIN (PORCINE) IN NACL 100-0.45 UNIT/ML-% IJ SOLN
1050.0000 [IU]/h | INTRAMUSCULAR | Status: DC
  Administered 2016-11-03: 1050 [IU]/h via INTRAVENOUS
  Filled 2016-11-03: qty 250

## 2016-11-03 MED ORDER — LORAZEPAM 2 MG/ML IJ SOLN
0.5000 mg | Freq: Once | INTRAMUSCULAR | Status: AC
  Administered 2016-11-03: 0.5 mg via INTRAVENOUS
  Filled 2016-11-03: qty 1

## 2016-11-03 MED ORDER — FENTANYL CITRATE (PF) 100 MCG/2ML IJ SOLN
INTRAMUSCULAR | Status: AC
  Filled 2016-11-03: qty 2

## 2016-11-03 SURGICAL SUPPLY — 19 items
BAG SNAP BAND KOVER 36X36 (MISCELLANEOUS) ×4 IMPLANT
CATH ANGIO 5F BER2 100CM (CATHETERS) ×4 IMPLANT
CATH ANGIO 5F BER2 65CM (CATHETERS) ×4 IMPLANT
CATH INFUS 135CMX50CM (CATHETERS) ×4 IMPLANT
CATH VISIONS PV .035 IVUS (CATHETERS) ×4 IMPLANT
COVER DOME SNAP 22 D (MISCELLANEOUS) ×4 IMPLANT
COVER PRB 48X5XTLSCP FOLD TPE (BAG) ×2 IMPLANT
COVER PROBE 5X48 (BAG) ×2
DEVICE TORQUE .025-.038 (MISCELLANEOUS) ×4 IMPLANT
GUIDEWIRE ANGLED .035X260CM (WIRE) ×4 IMPLANT
KIT PV (KITS) ×4 IMPLANT
SET ZELANTE DVT THROMB (CATHETERS) ×4 IMPLANT
SHEATH PINNACLE 5F 10CM (SHEATH) ×4 IMPLANT
SHEATH PINNACLE 9F 10CM (SHEATH) ×4 IMPLANT
TRANSDUCER W/STOPCOCK (MISCELLANEOUS) ×4 IMPLANT
TRAY PV CATH (CUSTOM PROCEDURE TRAY) ×4 IMPLANT
WIRE AMPLATZ SS-J .035X260CM (WIRE) ×4 IMPLANT
WIRE MINI STICK MAX (SHEATH) ×4 IMPLANT
WIRE TORQFLEX AUST .018X40CM (WIRE) ×8 IMPLANT

## 2016-11-03 NOTE — Op Note (Signed)
    Patient name: Christina [name] MRN: [id number] DOB: 06-Aug-1971 Sex: female  11/03/2016 Pre-operative Diagnosis: extensive left lower extremity dvt Post-operative diagnosis:  Same Surgeon:  Luanna SalkBrandon C. Randie Heinzain, MD Procedure Performed: 1.  US guided cannulation of left popliteal vein 2.  Intravascular ultrasound of left femoral, common femoral, external iliac, common iliac veins and ivc 3.  Thrombolysis of left common iliac, external iliac and common femoral veins 4.  Placement of 50cm treatment length lysis catheter from ivc to femoral vein 5.  Moderate sedation for 74 minutes  Indications:  46 yo female without significant medical history now presents with extensive left lower extremity DVT and is indicated for the above procedure.  Findings: The left common iliac vein is occluded at the level of the right common iliac artery consistent with make Turner. There is minimal flow in the left common iliac vein more peripherally and the external iliac vein is occluded. There is significant disease of the left common femoral vein and the profunda vein appears patent. There is intermittent disease of the left femoral vein throughout the thigh. Following administration of 10 mg of TPA AngioJet catheter there was no significant improvement disease and with this we elected to place a lysis catheter for overnight treated.   Procedure:  The patient was identified in the holding area and taken to room 8.  She was placed prone on the table and prepped and draped in usual fashion timeout called. We did administer fentanyl and Versed and then used ultrasound guidance to cannulate were popliteal vein on the left. We; puncture needle and were able to get a microwire passed. Initially we were in a false plane but with the second pass we did get into the vein placed a micropuncture sheath injected minimal contrast to confirm which showed significant clot in the femoral vein. We then exchanged for 5 French sheath. Using  BER catheter and Glidewire were able to cross very tight stenosis or occlusion of her left common iliac vein. We then placed an Amplatz wire into the SVC. IVIS was performed from the femoral vein all the way to the IVC with findings above of occlusion of her iliac system significant disease throughout her femoral system and a patent IVC. With this we elected to perform AngioJet lysis. 10 mg TPA was instilled into the iliac system and common femoral on the left. After 15 minutes we again performed IVIS which did not demonstrate any improvement and with a significant thrombus burden we elected to place lytic catheter for overnight TPA administration. Patient did tolerate this procedure well without immediate competition or significant blood loss.    Cyndee Giammarco C. Randie Heinzain, MD [facility] [place] Office: [phone] Pager: [phone]

## 2016-11-03 NOTE — Significant Event (Signed)
Duplicate orders for heparin and TPA.   Heparin remains at 500units/hour since initiated at cath lab (at 1457) .   Alteplase remains @ 1mg /hour since started at cath lab (1451pm).    Christina [name]

## 2016-11-03 NOTE — Progress Notes (Signed)
ANTICOAGULATION CONSULT NOTE - Initial Consult  Pharmacy Consult for heparin Indication: DVT  No Known Allergies  Patient Measurements: Height: 5' 5.5" (166.4 cm) Weight: 146 lb (66.2 kg) IBW/kg (Calculated) : 58.15 Heparin Dosing Weight: 66 kg  Vital Signs: Temp: 98.7 F (37.1 C) (03/12 0849) Temp Source: Oral (03/12 0849) BP: 128/94 (03/12 1139) Pulse Rate: 100 (03/12 1139)  Labs:  Recent Labs  11/03/16 0835  HGB 15.0  HCT 43.4  PLT 192  CREATININE 0.82    Estimated Creatinine Clearance: 78.8 mL/min (by C-G formula based on SCr of 0.82 mg/dL).   Medical History: Past Medical History:  Diagnosis Date  . Anxiety   . Back pain       Assessment: 46 yo female presents with lower back pain that travels to the back of legs and with L thigh swelling. Dopplers are (+) for DVT extending most of L lower extremity. SCr wnl, h/h and plts wnl.   Goal of Therapy:  Heparin level 0.3-0.7 units/ml Monitor platelets by anticoagulation protocol: Yes    Plan:  Heparin 3000 units x1 then 1050 units/hr Daily HL, CBC Check level this afternoon F/u plan for oral anticoagulation    Christina [name], Darl HouseholderAlison M 11/03/2016,11:59 AM

## 2016-11-03 NOTE — Progress Notes (Addendum)
ANTICOAGULATION CONSULT NOTE - Follow Up Consult  Pharmacy Consult for Heparin Indication: DVT  No Known Allergies  Patient Measurements: Height: 5\' 6"  (167.6 cm) Weight: 146 lb (66.2 kg) IBW/kg (Calculated) : 59.3 Heparin Dosing Weight:    Vital Signs: Temp: 99.1 F (37.3 C) (03/12 1700) Temp Source: Oral (03/12 1700) BP: 132/89 (03/12 1800) Pulse Rate: 76 (03/12 1800)  Labs:  Recent Labs  11/03/16 0835 11/03/16 1704  HGB 15.0 12.4  HCT 43.4 37.2  PLT 192 163  HEPARINUNFRC  --  0.45  CREATININE 0.82  --     Estimated Creatinine Clearance: 80.3 mL/min (by C-G formula based on SCr of 0.82 mg/dL).    Assessment:  Anticoag: heparin gtt for acute DVT extending length of whole leg. HL 0.45 in goal (from the 1050 units/hr) BUT heparin was decreased to 500 units/hr post-lysis attempt to flow only through the lysis sheath (clarified with MD). I took out the heparin levels for now. - 3/12 Thormbolysis: Following administration of 10 mg of TPA AngioJet catheter (2 attempts) there was no significant improvement in disease and with this we elected to place a lysis catheter for overnight treatment with alteplase 1mg /hr and heparin 500 units/hr   Goal of Therapy:  Heparin level 0.2-0.5 Monitor platelets by anticoagulation protocol: Yes   Plan:  Heparin 500 units/hr (ONLY) through the lysis sheath. Alteplase 1mg /hr into lysis catheter Daily CBC (took out the HL's)  Christina [name], PharmD, BCPS Clinical Staff Pharmacist Pager [phone]  Christina [name], Christina [name] 11/03/2016,6:27 PM

## 2016-11-03 NOTE — ED Notes (Signed)
Patient transported to CT 

## 2016-11-03 NOTE — ED Notes (Signed)
Heart rate noted to be 150 during vitals for triage, pt denies any chest pain or sob. Reports being under a lot of stress and not sleeping much over the past few nights.

## 2016-11-03 NOTE — H&P (Signed)
Hospital Consult    Reason for Consult:  Left leg extensive dvt Referring Physician:  ED MRN #:  [id number]  History of Present Illness: This is a 46 y.o. female presents with a one-day history of left lower extremity pain. Her only previous medical history significant for back pain fully has never had procedures. Procedure the past is a right shoulder surgery. She does not have any strokes she does not have any ongoing bleeding and has not had recent procedures. Her period ended 3 days ago. She denies a running history of DVT or family history of DVT. She has not had recent injury to her left upper extremity. Approximately pain associated with swelling and heaviness. She denies any skin changes. She denies numbness to her leg. She is an everyday smoker.  Past Medical History:  Diagnosis Date  . Anxiety   . Back pain     History reviewed. No pertinent surgical history.  No Known Allergies  Prior to Admission medications   Medication Sig Start Date End Date Taking? Authorizing Provider  acetaminophen (TYLENOL) 500 MG tablet Take 500 mg by mouth every 6 (six) hours as needed for mild pain or moderate pain.   Yes Historical Provider, MD  clonazePAM (KLONOPIN) 0.5 MG tablet Take 0.5 mg by mouth 2 (two) times daily as needed for anxiety.   Yes Historical Provider, MD  diphenhydramine-acetaminophen (TYLENOL PM) 25-500 MG TABS tablet Take 1 tablet by mouth at bedtime as needed.   Yes Historical Provider, MD  cetirizine (ZYRTEC) 10 MG tablet Take 10 mg by mouth daily as needed for allergies.    Historical Provider, MD  cyclobenzaprine (FLEXERIL) 10 MG tablet Take 1 tablet (10 mg total) by mouth 3 (three) times daily as needed for muscle spasms. Patient not taking: Reported on 11/03/2016 05/03/15   Selina Cooley, MD  HYDROcodone-acetaminophen (NORCO/VICODIN) 5-325 MG tablet Take 1-2 tablets by mouth every 6 hours as needed for pain and/or cough. Patient not taking: Reported on 11/03/2016 04/06/16    Joni Reining Pisciotta, PA-C  methocarbamol (ROBAXIN) 500 MG tablet Take 2 tablets (1,000 mg total) by mouth 4 (four) times daily as needed (Pain). Patient not taking: Reported on 11/03/2016 04/06/16   Wynetta Emery, PA-C    Social History   Social History  . Marital status: Divorced    Spouse name: N/A  . Number of children: N/A  . Years of education: N/A   Occupational History  . Not on file.   Social History Main Topics  . Smoking status: Current Every Day Smoker  . Smokeless tobacco: Never Used  . Alcohol use No  . Drug use: Unknown  . Sexual activity: Not on file   Other Topics Concern  . Not on file   Social History Narrative  . No narrative on file     No family history on file.  ROS: [x]  Positive   [ ]  Negative   [ ]  All sytems reviewed and are negative  Cardiovascular: []  chest pain/pressure []  palpitations []  SOB lying flat []  DOE []  pain in legs while walking []  pain in legs at rest []  pain in legs at night []  non-healing ulcers []  hx of DVT []  swelling in legs  Pulmonary: []  productive cough []  asthma/wheezing []  home O2  Neurologic: []  weakness in []  arms []  legs []  numbness in []  arms []  legs []  hx of CVA []  mini stroke [] difficulty speaking or slurred speech []  temporary loss of vision in one eye []  dizziness  Hematologic: []   hx of cancer []  bleeding problems []  problems with blood clotting easily  Endocrine:   []  diabetes []  thyroid disease  GI []  vomiting blood []  blood in stool  GU: []  CKD/renal failure []  HD--[]  M/W/F or []  T/T/S []  burning with urination []  blood in urine  Psychiatric: []  anxiety []  depression  Musculoskeletal: []  arthritis []  joint pain  Integumentary: []  rashes []  ulcers  Constitutional: []  fever []  chills   Physical Examination  Vitals:   11/03/16 1100 11/03/16 1139  BP: 128/84 128/94  Pulse: 98 100  Resp: 13 10  Temp:     Body mass index is 23.93 kg/m.  General:  WDWN in  NAD Gait: Not observed HENT: WNL, normocephalic Pulmonary: normal non-labored breathing, without Rales, rhonchi,  wheezing Cardiac: mild tachy to 110, palpable right dp/pt, left signals at dp/pt unable to palpate 2/2 edema Abdomen: soft, NT/ND, no masses Extremities: left leg with significant edema that is nonpitting, her thigh and calf are tender throughout with skin color changes Musculoskeletal: no muscle wasting or atrophy  Neurologic: A&O X 3; Appropriate Affect ; SENSATION: normal; MOTOR FUNCTION:  moving all extremities equally. Speech is fluent/normal Psychiatric:  Appropriate mood and affect   CBC    Component Value Date/Time   WBC 8.5 11/03/2016 0835   RBC 5.15 (H) 11/03/2016 0835   HGB 15.0 11/03/2016 0835   HCT 43.4 11/03/2016 0835   PLT 192 11/03/2016 0835   MCV 84.3 11/03/2016 0835   MCH 29.1 11/03/2016 0835   MCHC 34.6 11/03/2016 0835   RDW 13.9 11/03/2016 0835   LYMPHSABS 1.7 11/03/2016 0835   MONOABS 0.8 11/03/2016 0835   EOSABS 0.1 11/03/2016 0835   BASOSABS 0.0 11/03/2016 0835    BMET    Component Value Date/Time   NA 136 11/03/2016 0835   K 3.6 11/03/2016 0835   CL 102 11/03/2016 0835   CO2 23 11/03/2016 0835   GLUCOSE 134 (H) 11/03/2016 0835   BUN 10 11/03/2016 0835   CREATININE 0.82 11/03/2016 0835   CALCIUM 9.2 11/03/2016 0835   GFRNONAA >60 11/03/2016 0835   GFRAA >60 11/03/2016 0835    COAGS: No results found for: INR, PROTIME   Non-Invasive Vascular Imaging:   Summary: There is evidence of acute deep vein thrombosis involving the common femoral, femoral, profunda femoral, popliteal, posterior tibial, and peroneal veins of the left lower extremity.  There is also evidence of acute superficial vein thrombosis involving the great and lesser saphenous veins of the left lower extremity. There is no evidence of deep or superficial vein thrombosis involving the right lower extremity. There is no evidence of a Baker&'s cyst  bilaterally. Other specific details can be found in the table(s) above. Prepared and Electronically Authenticated by   ASSESSMENT/PLAN: This is a 46 y.o. female with extensive left lower extremity DVT. I discussed with her the concern at this likely extends more cephalad into her pelvis and abdomen. We discussed the options of heparin therapy which she is been initiated on at this time versus interventional treatment. Therapy would wrote quire ultrasound guided cannulation of her left popliteal vein with her in a prone position with direct pressure ultrasound possible pharmacologic echo thrombolyze this and possible overnight lytic I discussed risks of vessel injury from an embolism bleeding complications including stroke intra-abdominal bleeding bleeding from the site of the catheter. Given her significant discomfort she wishes to proceed at this time.    Calhoun Reichardt C. Randie Heinzain, MD [facility] Office:  [phone] Pager: [phone]

## 2016-11-03 NOTE — Progress Notes (Signed)
**  Preliminary report by tech**  Bilateral lower extremity venous duplex complete. There is evidence of acute deep vein thrombosis involving the common femoral, femoral, profunda femoral, popliteal, posterior tibial, and peroneal veins of the left lower extremity. There is also evidence of acute superficial vein thrombosis involving the great and lesser saphenous veins of the left lower extremity. There is no evidence of deep or superficial vein thrombosis involving the right lower extremity. There is no evidence of a Baker's cyst bilaterally. Results were given to Dr. Anitra LauthPlunkett.  11/03/16 11:57 AM Olen CordialGreg Samael Blades RVT

## 2016-11-03 NOTE — ED Triage Notes (Signed)
Pt reports lower back pain that began 3 days ago, c/o pain that travels from back to legs and left thigh swelling. Pt denies fall or injury.

## 2016-11-03 NOTE — ED Notes (Signed)
Pt returned from CT °

## 2016-11-04 ENCOUNTER — Inpatient Hospital Stay (HOSPITAL_COMMUNITY)
Admission: EM | Disposition: A | Discharge status: Home / Self Care | Payer: Self-pay | Source: Home / Self Care | Attending: Vascular Surgery

## 2016-11-04 ENCOUNTER — Encounter (HOSPITAL_COMMUNITY): Payer: Self-pay | Admitting: Vascular Surgery

## 2016-11-04 HISTORY — PX: PERIPHERAL VASCULAR INTERVENTION: CATH118257

## 2016-11-04 HISTORY — PX: LOWER EXTREMITY VENOGRAPHY: CATH118253

## 2016-11-04 LAB — BASIC METABOLIC PANEL
Anion gap: 6 (ref 5–15)
CO2: 26 mmol/L (ref 22–32)
CREATININE: 0.57 mg/dL (ref 0.44–1.00)
Calcium: 8.2 mg/dL — ABNORMAL LOW (ref 8.9–10.3)
Chloride: 103 mmol/L (ref 101–111)
GFR calc Af Amer: >60 mL/min (ref >60)
GFR calc non Af Amer: >60 mL/min (ref >60)
Glucose, Bld: 98 mg/dL (ref 65–99)
Potassium: 3.6 mmol/L (ref 3.5–5.1)
Sodium: 135 mmol/L (ref 135–145)

## 2016-11-04 LAB — CBC
HCT: 37.7 % (ref 36.0–46.0)
HEMATOCRIT: 35.2 % — AB (ref 36.0–46.0)
Hemoglobin: 11.8 g/dL — ABNORMAL LOW (ref 12.0–15.0)
Hemoglobin: 12.8 g/dL (ref 12.0–15.0)
MCH: 28.4 pg (ref 26.0–34.0)
MCH: 28.8 pg (ref 26.0–34.0)
MCHC: 33.5 g/dL (ref 30.0–36.0)
MCHC: 34 g/dL (ref 30.0–36.0)
MCV: 84.8 fL (ref 78.0–100.0)
MCV: 84.7 fL (ref 78.0–100.0)
PLATELETS: 112 10*3/uL — AB (ref 150–400)
Platelets: 133 10*3/uL — ABNORMAL LOW (ref 150–400)
RBC: 4.15 MIL/uL (ref 3.87–5.11)
RBC: 4.45 MIL/uL (ref 3.87–5.11)
RDW: 14 % (ref 11.5–15.5)
RDW: 14 % (ref 11.5–15.5)
WBC: 5.5 10*3/uL (ref 4.0–10.5)
WBC: 6.7 10*3/uL (ref 4.0–10.5)

## 2016-11-04 LAB — POCT ACTIVATED CLOTTING TIME: Activated Clotting Time: 169 seconds

## 2016-11-04 LAB — FIBRINOGEN
Fibrinogen: 153 mg/dL — ABNORMAL LOW (ref 210–475)
Fibrinogen: 150 mg/dL — ABNORMAL LOW (ref 210–475)

## 2016-11-04 LAB — HEPARIN LEVEL (UNFRACTIONATED): Heparin Unfractionated: 0.32 IU/mL (ref 0.30–0.70)

## 2016-11-04 SURGERY — LOWER EXTREMITY VENOGRAPHY
Anesthesia: LOCAL | Laterality: Left

## 2016-11-04 MED ORDER — ACETAMINOPHEN 325 MG PO TABS: 325.0000 mg | ORAL_TABLET | ORAL | Status: DC | PRN

## 2016-11-04 MED ORDER — SODIUM CHLORIDE 0.9 % IV SOLN
INTRAVENOUS | Status: DC
  Administered 2016-11-04: 11:00:00 via INTRAVENOUS

## 2016-11-04 MED ORDER — MIDAZOLAM HCL 2 MG/2ML IJ SOLN
INTRAMUSCULAR | Status: DC | PRN
  Administered 2016-11-04 (×5): 1 mg via INTRAVENOUS

## 2016-11-04 MED ORDER — LIDOCAINE HCL (PF) 1 % IJ SOLN
INTRAMUSCULAR | Status: DC | PRN
Start: 2016-11-04 — End: 2016-11-04
  Administered 2016-11-04: 20 mL via SUBCUTANEOUS

## 2016-11-04 MED ORDER — FENTANYL CITRATE (PF) 100 MCG/2ML IJ SOLN
INTRAMUSCULAR | Status: AC
  Filled 2016-11-04: qty 2

## 2016-11-04 MED ORDER — ACETAMINOPHEN 325 MG RE SUPP: 325.0000 mg | RECTAL | Status: DC | PRN

## 2016-11-04 MED ORDER — DOCUSATE SODIUM 100 MG PO CAPS
100.0000 mg | ORAL_CAPSULE | Freq: Every day | ORAL | Status: DC
  Administered 2016-11-05 – 2016-11-07 (×3): 100 mg via ORAL
  Filled 2016-11-04 (×3): qty 1

## 2016-11-04 MED ORDER — ALUM & MAG HYDROXIDE-SIMETH 200-200-20 MG/5ML PO SUSP: 15.0000 mL | ORAL | Status: DC | PRN

## 2016-11-04 MED ORDER — MIDAZOLAM HCL 2 MG/2ML IJ SOLN
INTRAMUSCULAR | Status: AC
  Filled 2016-11-04: qty 2

## 2016-11-04 MED ORDER — LIDOCAINE HCL (PF) 1 % IJ SOLN
INTRAMUSCULAR | Status: AC
  Filled 2016-11-04: qty 30

## 2016-11-04 MED ORDER — HEPARIN (PORCINE) IN NACL 100-0.45 UNIT/ML-% IJ SOLN
1150.0000 [IU]/h | INTRAMUSCULAR | Status: DC
  Administered 2016-11-04 – 2016-11-05 (×2): 1150 [IU]/h via INTRAVENOUS
  Filled 2016-11-04 (×2): qty 250

## 2016-11-04 MED ORDER — METOPROLOL TARTRATE 5 MG/5ML IV SOLN: 2.0000 mg | INTRAVENOUS | Status: DC | PRN

## 2016-11-04 MED ORDER — HEPARIN SODIUM (PORCINE) 1000 UNIT/ML IJ SOLN
INTRAMUSCULAR | Status: AC
  Filled 2016-11-04: qty 1

## 2016-11-04 MED ORDER — FENTANYL CITRATE (PF) 100 MCG/2ML IJ SOLN
INTRAMUSCULAR | Status: DC | PRN
  Administered 2016-11-04 (×6): 25 ug via INTRAVENOUS

## 2016-11-04 MED ORDER — GUAIFENESIN-DM 100-10 MG/5ML PO SYRP: 15.0000 mL | ORAL_SOLUTION | ORAL | Status: DC | PRN

## 2016-11-04 MED ORDER — LABETALOL HCL 5 MG/ML IV SOLN: 10.0000 mg | INTRAVENOUS | Status: DC | PRN

## 2016-11-04 MED ORDER — IODIXANOL 320 MG/ML IV SOLN
INTRAVENOUS | Status: DC | PRN
  Administered 2016-11-04: 20 mL

## 2016-11-04 MED ORDER — ASPIRIN EC 81 MG PO TBEC
81.0000 mg | DELAYED_RELEASE_TABLET | Freq: Every day | ORAL | Status: DC
  Administered 2016-11-04 – 2016-11-07 (×4): 81 mg via ORAL
  Filled 2016-11-04 (×4): qty 1

## 2016-11-04 MED ORDER — SODIUM CHLORIDE 0.9 % IV SOLN: 500.0000 mL | Freq: Once | INTRAVENOUS | Status: DC | PRN

## 2016-11-04 MED ORDER — PHENOL 1.4 % MT LIQD
1.0000 | OROMUCOSAL | Status: DC | PRN
Start: 2016-11-04 — End: 2016-11-07

## 2016-11-04 MED ORDER — SODIUM CHLORIDE 0.9 % IV SOLN
INTRAVENOUS | Status: DC
  Administered 2016-11-05: 100 mL/h via INTRAVENOUS
  Administered 2016-11-06: 08:00:00 via INTRAVENOUS

## 2016-11-04 MED ORDER — HYDRALAZINE HCL 20 MG/ML IJ SOLN: 5.0000 mg | INTRAMUSCULAR | Status: DC | PRN

## 2016-11-04 MED ORDER — ONDANSETRON HCL 4 MG/2ML IJ SOLN: 4.0000 mg | Freq: Four times a day (QID) | INTRAMUSCULAR | Status: DC | PRN

## 2016-11-04 SURGICAL SUPPLY — 14 items
BALLN ATLAS 14X40X75 (BALLOONS) ×2
BALLN MUSTANG 10.0X40 75 (BALLOONS) ×2
BALLOON ATLAS 14X40X75 (BALLOONS) ×1 IMPLANT
BALLOON MUSTANG 10.0X40 75 (BALLOONS) ×1 IMPLANT
CATH VISIONS PV .035 IVUS (CATHETERS) ×2 IMPLANT
DRAPE ZERO GRAVITY STERILE (DRAPES) ×2 IMPLANT
KIT ENCORE 26 ADVANTAGE (KITS) ×2 IMPLANT
KIT PV (KITS) ×2 IMPLANT
SET ZELANTE DVT THROMB (CATHETERS) ×2 IMPLANT
SHEATH PINNACLE 9F 10CM (SHEATH) ×2 IMPLANT
STENT WALLSTENT 18X90X75 (Permanent Stent) ×2 IMPLANT
STOPCOCK MORSE 400PSI 3WAY (MISCELLANEOUS) ×2 IMPLANT
TRAY PV CATH (CUSTOM PROCEDURE TRAY) ×2 IMPLANT
WIRE AMPLATZ SS-J .035X260CM (WIRE) ×2 IMPLANT

## 2016-11-04 NOTE — ED Provider Notes (Signed)
[facility]-EMER[facility]ENCY DEPT Provider Note   CSN: [id number] Arrival date & time: 11/03/16  4098     History   Chief Complaint Chief Complaint  Patient presents with  . Leg Swelling  . Tachycardia    HPI Christina [name] is a 46 y.o. female.  HPI 46 yo AA female with pmh sig for chronic low back pain presents to the ED today with low back pain and and left leg swelling. Pt states the back pain started 3 days ago. Yesterday she woke up with left leg swelling and severe pain. She denies any new injury or trauma. She has not tried anything for the pain pta. She denies any sob or cp but hr was 130 in triage. She states she is under a lot of stress and not sleeping much the past few nights. She lives at home with her 69 yo grandmother who she cares for. She denies any loss of bowel or bladder, saddle paresthesias, urinary retention, or lower extremity paresthesias. Denies any hx of dvt, family hx of dvt, clotting disorder, ocps use, recent hospitalization, prolonged immobilization. Denies any urinary or vaginal symptoms.   Past Medical History:  Diagnosis Date  . Anxiety   . Back pain     Patient Active Problem List   Diagnosis Date Noted  . DVT (deep venous thrombosis) (HCC) 11/03/2016    Past Surgical History:  Procedure Laterality Date  . INTRAVASCULAR ULTRASOUND/IVUS Left 11/03/2016   Procedure: Intravascular Ultrasound/IVUS;  Surgeon: Maeola Harman, MD;  Location: [facility] INVASIVE CV LAB;  Service: Cardiovascular;  Laterality: Left;  . PERIPHERAL VASCULAR THROMBECTOMY Left 11/03/2016   Procedure: Peripheral Vascular Thrombectomy;  Surgeon: Maeola Harman, MD;  Location: [facility] INVASIVE CV LAB;  Service: Cardiovascular;  Laterality: Left;    OB History    No data available       Home Medications    Prior to Admission medications   Medication Sig Start Date End Date Taking? Authorizing Provider  acetaminophen (TYLENOL) 500 MG tablet Take 500 mg by mouth every 6 (six)  hours as needed for mild pain or moderate pain.   Yes Historical Provider, MD  clonazePAM (KLONOPIN) 0.5 MG tablet Take 0.5 mg by mouth 2 (two) times daily as needed for anxiety.   Yes Historical Provider, MD  diphenhydramine-acetaminophen (TYLENOL PM) 25-500 MG TABS tablet Take 1 tablet by mouth at bedtime as needed.   Yes Historical Provider, MD  cetirizine (ZYRTEC) 10 MG tablet Take 10 mg by mouth daily as needed for allergies.    Historical Provider, MD  cyclobenzaprine (FLEXERIL) 10 MG tablet Take 1 tablet (10 mg total) by mouth 3 (three) times daily as needed for muscle spasms. Patient not taking: Reported on 11/03/2016 05/03/15   Selina Cooley, MD  HYDROcodone-acetaminophen (NORCO/VICODIN) 5-325 MG tablet Take 1-2 tablets by mouth every 6 hours as needed for pain and/or cough. Patient not taking: Reported on 11/03/2016 04/06/16   Joni Reining Pisciotta, PA-C  methocarbamol (ROBAXIN) 500 MG tablet Take 2 tablets (1,000 mg total) by mouth 4 (four) times daily as needed (Pain). Patient not taking: Reported on 11/03/2016 04/06/16   Wynetta Emery, PA-C    Family History No family history on file.  Social History Social History  Substance Use Topics  . Smoking status: Current Every Day Smoker  . Smokeless tobacco: Never Used  . Alcohol use No     Allergies   Patient has no known allergies.   Review of Systems Review of Systems  Constitutional: Negative for chills  and fever.  HENT: Negative for congestion.   Eyes: Negative for visual disturbance.  Respiratory: Negative for cough and shortness of breath.   Cardiovascular: Positive for leg swelling. Negative for chest pain and palpitations.  Gastrointestinal: Negative for abdominal pain, diarrhea, nausea and vomiting.  Genitourinary: Negative for dysuria, frequency and urgency.  Musculoskeletal: Positive for back pain and gait problem.  Skin: Negative.   Neurological: Negative for dizziness, syncope, weakness, light-headedness, numbness and  headaches.  All other systems reviewed and are negative.    Physical Exam Updated Vital Signs BP 127/79   Pulse 95   Temp 98.3 F (36.8 C)   Resp 17   Ht 5\' 6"  (1.676 m)   Wt 66.2 kg   LMP 11/01/2016   SpO2 100%   BMI 23.57 kg/m   Physical Exam  Constitutional: She is oriented to person, place, and time. She appears well-developed and well-nourished. No distress.  HENT:  Head: Normocephalic and atraumatic.  Mouth/Throat: Oropharynx is clear and moist.  Eyes: Conjunctivae are normal. Right eye exhibits no discharge. Left eye exhibits no discharge. No scleral icterus.  Neck: Normal range of motion. Neck supple. No thyromegaly present.  Cardiovascular: Regular rhythm, normal heart sounds and intact distal pulses.  Tachycardia present.  Exam reveals no gallop and no friction rub.   No murmur heard. Pulmonary/Chest: Effort normal and breath sounds normal. No tachypnea. No respiratory distress. She has no wheezes. She has no rales. She exhibits no tenderness.  No hypoxia  Abdominal: Soft. Bowel sounds are normal. She exhibits no distension. There is no tenderness.  Musculoskeletal: Normal range of motion. She exhibits edema and tenderness.  Sig swelling of the left lower extremitiy that is tender to palpation. No erythema or warmth noted. Right LLE is normal. Dp pulses are 2+ bilaterally. Sensation intact. Cap refill normal.   No midline tenderness of l or t spine. Para spial tenderness of the lumber region. No deformity or step offs noted. Full rom.   Lymphadenopathy:    She has no cervical adenopathy.  Neurological: She is alert and oriented to person, place, and time.  Skin: Skin is warm and dry.  Nursing note and vitals reviewed.    ED Treatments / Results  Labs (all labs ordered are listed, but only abnormal results are displayed) Labs Reviewed  CBC WITH DIFFERENTIAL/PLATELET - Abnormal; Notable for the following:       Result Value   RBC 5.15 (*)    All other  components within normal limits  BASIC METABOLIC PANEL - Abnormal; Notable for the following:    Glucose, Bld 134 (*)    All other components within normal limits  HEPARIN LEVEL (UNFRACTIONATED) - Abnormal; Notable for the following:    Heparin Unfractionated <0.10 (*)    All other components within normal limits  CBC - Abnormal; Notable for the following:    Platelets 125 (*)    All other components within normal limits  CBC - Abnormal; Notable for the following:    Hemoglobin 11.8 (*)    HCT 35.2 (*)    Platelets 133 (*)    All other components within normal limits  BASIC METABOLIC PANEL - Abnormal; Notable for the following:    BUN <5 (*)    Calcium 8.2 (*)    All other components within normal limits  FIBRINOGEN - Abnormal; Notable for the following:    Fibrinogen 153 (*)    All other components within normal limits  CBC - Abnormal; Notable for  the following:    Platelets 112 (*)    All other components within normal limits  FIBRINOGEN - Abnormal; Notable for the following:    Fibrinogen 150 (*)    All other components within normal limits  SURGICAL PCR SCREEN  HEPARIN LEVEL (UNFRACTIONATED)  HEPARIN LEVEL (UNFRACTIONATED)  CBC  FIBRINOGEN  FIBRINOGEN  I-STAT TROPOININ, ED    EKG  EKG Interpretation  Date/Time:  Monday November 03 2016 08:25:29 EDT Ventricular Rate:  137 PR Interval:    QRS Duration: 77 QT Interval:  300 QTC Calculation: 453 R Axis:   51 Text Interpretation:  Sinus tachycardia Baseline wander in lead(s) V5 No significant change since last tracing Confirmed by Anitra Lauth  MD, Alphonzo Lemmings ([id number]) on 11/03/2016 8:37:23 AM       Radiology Ct Angio Chest Pe W/cm &/or Wo Cm  Addendum Date: 11/03/2016   ADDENDUM REPORT: 11/03/2016 11:18 ADDENDUM: Subcentimeter nodular opacity in right lobe of thyroid. Further evaluation of this nodular opacity is not felt to be warranted per consensus guidelines. Electronically Signed   By: Bretta Bang III M.D.   On:  11/03/2016 11:18   Result Date: 11/03/2016 CLINICAL DATA:  Tachycardia and lower extremity edema EXAM: CT ANGIOGRAPHY CHEST WITH CONTRAST TECHNIQUE: Multidetector CT imaging of the chest was performed using the standard protocol during bolus administration of intravenous contrast. Multiplanar CT image reconstructions and MIPs were obtained to evaluate the vascular anatomy. CONTRAST:  100 mL Isovue 370 nonionic COMPARISON:  None. FINDINGS: Cardiovascular: There is no demonstrable pulmonary embolus. There is no thoracic aortic aneurysm or dissection. The visualized great vessels appear unremarkable. Pericardium is not appreciably thickened. Mediastinum/Nodes: There is an 8 x 7 mm nodular opacity in the right lobe of thyroid. Visualized thyroid appears normal otherwise. There is no appreciable thoracic adenopathy. Lungs/Pleura: There is slight bibasilar lung atelectatic change. There is no lung edema or consolidation. There is no pleural effusion or pleural thickening. Upper Abdomen: Spleen is upper normal in size. Visualized upper abdominal structures otherwise appear unremarkable. Musculoskeletal: There are no blastic or lytic bone lesions. No chest wall lesions. Review of the MIP images confirms the above findings. IMPRESSION: No demonstrable pulmonary embolus. Slight atelectasis in the lung bases without edema or consolidation. No adenopathy evident. Spleen upper normal in size. Electronically Signed: By: Bretta Bang III M.D. On: 11/03/2016 11:09    Procedures Procedures (including critical care time)  Medications Ordered in ED Medications  cyclobenzaprine (FLEXERIL) tablet 10 mg ( Oral MAR Hold 11/04/16 1050)  methocarbamol (ROBAXIN) tablet 1,000 mg ( Oral MAR Hold 11/04/16 1050)  heparin ADULT infusion 100 units/mL (25000 units/249mL sodium chloride 0.45%) (0 Units/hr Intravenous Stopped 11/04/16 1055)  morphine 4 MG/ML injection 5 mg ( Intravenous MAR Hold 11/04/16 1050)  midazolam (VERSED)  injection 1 mg ( Intravenous MAR Hold 11/04/16 1050)  ondansetron (ZOFRAN) injection 4 mg ( Intravenous MAR Hold 11/04/16 1050)  metoprolol (LOPRESSOR) injection 5 mg ( Intravenous Automatically Held 11/19/16 1800)  cloNIDine (CATAPRES) tablet 0.2 mg ( Oral MAR Hold 11/04/16 1050)  alteplase (LIMB ISCHEMIA) 10 mg in normal saline (0.02 mg/mL) infusion (0 mg/hr Intracatheter Stopped 11/04/16 1055)  oxyCODONE-acetaminophen (PERCOCET/ROXICET) 5-325 MG per tablet 1-2 tablet ( Oral MAR Hold 11/04/16 1050)  0.9 %  sodium chloride infusion ( Intravenous MAR Hold 11/04/16 1050)  0.9 %  sodium chloride infusion ( Intravenous Rate/Dose Change 11/04/16 1030)  fentaNYL (SUBLIMAZE) injection (25 mcg Intravenous Given 11/04/16 1115)  midazolam (VERSED) injection (1 mg Intravenous Given 11/04/16 1115)  morphine 4 MG/ML injection 4 mg (4 mg Intravenous Given 11/03/16 0907)  ondansetron (ZOFRAN) injection 4 mg (4 mg Intravenous Given 11/03/16 0907)  LORazepam (ATIVAN) injection 0.5 mg (0.5 mg Intravenous Given 11/03/16 1229)  iopamidol (ISOVUE-370) 76 % injection (100 mLs  Contrast Given 11/03/16 1034)  HYDROmorphone (DILAUDID) injection 0.5 mg (0.5 mg Intravenous Given 11/03/16 1229)  heparin bolus via infusion 3,000 Units (3,000 Units Intravenous Bolus from Bag 11/03/16 1235)     Initial Impression / Assessment and Plan / ED Course  I have reviewed the triage vital signs and the nursing notes.  Pertinent labs & imaging results that were available during my care of the patient were reviewed by me and considered in my medical decision making (see chart for details).    Pt presents to the ED with chronic back pain, left leg edema and pain along with tachycardia. She denies any cp or sob. Tachycardic in triage of 150. EKG so sinus tach. She is not hypoxic or tachypeneic. Neurovascularly in tact. No red flag symptoms concerning for cauda equina. Given edema of lower extremity and tachycardia concerned for DVT and possible PE.  Labs ordered. [place] of LLE and CTA ordered.    . Labs unremarkable. Troponin negative. Clinical presenation is not consistent with ACS. No d dimer as clinical suspicion for pe/dvt is hight. CTA shows no signs of PE. Pt hr improved. [place] of LLE showed extensive evidence of acute deep vein thrombosis involving the common femoral, femoral, profunda femoral, popliteal, posterior tibial, and peroneal veins of the left lower extremity acute superficial vein thrombosis involving the great and lesser saphenous veins of the left lower Extremity. Spoke with Dr. Henry Russel with vascular surgery who will come to the ED to evaluate pt. Will start pt on heparin per pharmacy protocol. Feel that back pain is related to the extensive LLE dvt. Pt was seen and evaluated by Dr. Anitra Lauth who is agreeable to the above plan. Pt updated on plan of care. Currently hemodynamically stable.   Final Clinical Impressions(s) / ED Diagnoses   Final diagnoses:  Acute deep vein thrombosis (DVT) of femoral vein of left lower extremity [facility])    New Prescriptions Current Discharge Medication List       Rise Mu, PA-C 11/04/16 1138    Gwyneth Sprout, MD 11/04/16 2144

## 2016-11-04 NOTE — Progress Notes (Signed)
ANTICOAGULATION CONSULT NOTE - Follow Up Consult  Pharmacy Consult for Heparin Indication: DVT  No Known Allergies  Patient Measurements: Height: 5\' 6"  (167.6 cm) Weight: 146 lb (66.2 kg) IBW/kg (Calculated) : 59.3  Vital Signs: Temp: 98.9 F (37.2 C) (03/13 1931) Temp Source: Oral (03/13 1931) BP: 121/98 (03/13 1800) Pulse Rate: 95 (03/13 1800)  Labs:  Recent Labs  11/03/16 0835 11/03/16 1704 11/03/16 2214 11/04/16 0511 11/04/16 0823 11/04/16 2022  HGB 15.0 12.4 12.1 11.8* 12.8  --   HCT 43.4 37.2 36.3 35.2* 37.7  --   PLT 192 163 125* 133* 112*  --   HEPARINUNFRC  --  0.42  0.45 <0.10*  --   --  0.32  CREATININE 0.82  --   --  0.57  --   --     Estimated Creatinine Clearance: 82.3 mL/min (by C-G formula based on SCr of 0.57 mg/dL).   Medications:  Scheduled:  . aspirin EC  81 mg Oral Daily  . [START ON 11/05/2016] docusate sodium  100 mg Oral Daily  . metoprolol  5 mg Intravenous Q6H    Assessment: 46yo female with DVT s/p lysis & mechanical thrombectomy + stenting of common/external iliac veins.  Rate increased to full dosing this afternoon Heparin level is within goal at 6hr.  Per d/w RN, there is some oozing but not of significance.    Goal of Therapy:  Heparin level 0.3-0.7 units/ml Monitor platelets by anticoagulation protocol: Yes   Plan:  Continue heparin 1150 units/hr Repeat heparin level 6hr  Marisue HumbleKendra Magalie Almon, PharmD Clinical Pharmacist [facility]

## 2016-11-04 NOTE — H&P (View-Only) (Signed)
  Progress Note    11/04/2016 10:34 AM 1 Day Post-Op  Subjective:  Improved pain in left leg  Vitals:   11/04/16 0600 11/04/16 0700  BP: 119/77 125/79  Pulse: 80 82  Resp: 10 11  Temp:  98.3 F (36.8 C)    Physical Exam: aaox3 Neuro in tact Left leg with significant improvement in edema  CBC    Component Value Date/Time   WBC 6.7 11/04/2016 0823   RBC 4.45 11/04/2016 0823   HGB 12.8 11/04/2016 0823   HCT 37.7 11/04/2016 0823   PLT 112 (L) 11/04/2016 0823   MCV 84.7 11/04/2016 0823   MCH 28.8 11/04/2016 0823   MCHC 34.0 11/04/2016 0823   RDW 14.0 11/04/2016 0823   LYMPHSABS 1.7 11/03/2016 0835   MONOABS 0.8 11/03/2016 0835   EOSABS 0.1 11/03/2016 0835   BASOSABS 0.0 11/03/2016 0835    BMET    Component Value Date/Time   NA 135 11/04/2016 0511   K 3.6 11/04/2016 0511   CL 103 11/04/2016 0511   CO2 26 11/04/2016 0511   GLUCOSE 98 11/04/2016 0511   BUN <5 (L) 11/04/2016 0511   CREATININE 0.57 11/04/2016 0511   CALCIUM 8.2 (L) 11/04/2016 0511   GFRNONAA >60 11/04/2016 0511   GFRAA >60 11/04/2016 0511    INR No results found for: INR   Intake/Output Summary (Last 24 hours) at 11/04/16 1034 Last data filed at 11/04/16 0700  Gross per 24 hour  Intake          1550.03 ml  Output             1200 ml  Net           350.03 ml     Assessment:  46 y.o. female is s/p overnight lysis for extensive left lower extremity dvt  Plan: pv lab today for lysis recheck with ivus, possible pharmacomechanical thrombectomy and stenting Discussed risks and benefits and she agrees to proceed tpa was halved again this a.m.   Brandon C. Randie Heinzain, MD [facility] [place] Office: [phone] Pager: [phone]  11/04/2016 10:34 AM

## 2016-11-04 NOTE — Progress Notes (Signed)
  Progress Note    11/04/2016 10:34 AM 1 Day Post-Op  Subjective:  Improved pain in left leg  Vitals:   11/04/16 0600 11/04/16 0700  BP: 119/77 125/79  Pulse: 80 82  Resp: 10 11  Temp:  98.3 F (36.8 C)    Physical Exam: aaox3 Neuro in tact Left leg with significant improvement in edema  CBC    Component Value Date/Time   WBC 6.7 11/04/2016 0823   RBC 4.45 11/04/2016 0823   HGB 12.8 11/04/2016 0823   HCT 37.7 11/04/2016 0823   PLT 112 (L) 11/04/2016 0823   MCV 84.7 11/04/2016 0823   MCH 28.8 11/04/2016 0823   MCHC 34.0 11/04/2016 0823   RDW 14.0 11/04/2016 0823   LYMPHSABS 1.7 11/03/2016 0835   MONOABS 0.8 11/03/2016 0835   EOSABS 0.1 11/03/2016 0835   BASOSABS 0.0 11/03/2016 0835    BMET    Component Value Date/Time   NA 135 11/04/2016 0511   K 3.6 11/04/2016 0511   CL 103 11/04/2016 0511   CO2 26 11/04/2016 0511   GLUCOSE 98 11/04/2016 0511   BUN <5 (L) 11/04/2016 0511   CREATININE 0.57 11/04/2016 0511   CALCIUM 8.2 (L) 11/04/2016 0511   GFRNONAA >60 11/04/2016 0511   GFRAA >60 11/04/2016 0511    INR No results found for: INR   Intake/Output Summary (Last 24 hours) at 11/04/16 1034 Last data filed at 11/04/16 0700  Gross per 24 hour  Intake          1550.03 ml  Output             1200 ml  Net           350.03 ml     Assessment:  45 y.o. female is s/p overnight lysis for extensive left lower extremity dvt  Plan: pv lab today for lysis recheck with ivus, possible pharmacomechanical thrombectomy and stenting Discussed risks and benefits and she agrees to proceed tpa was halved again this a.m.   Melynda Krzywicki C. Ande Therrell, MD [facility] [place] Office: [phone] Pager: [phone]  11/04/2016 10:34 AM  

## 2016-11-04 NOTE — Progress Notes (Addendum)
ANTICOAGULATION CONSULT NOTE - Follow Up Consult  Pharmacy Consult for Heparin Indication: DVT  No Known Allergies  Patient Measurements: Height: 5\' 6"  (167.6 cm) Weight: 146 lb (66.2 kg) IBW/kg (Calculated) : 59.3 Heparin Dosing Weight: 66.2 kg  Vital Signs: Temp: 98.3 F (36.8 C) (03/13 0700) Temp Source: Oral (03/13 0400) BP: 125/79 (03/13 0700) Pulse Rate: 82 (03/13 0700)  Labs:  Recent Labs  11/03/16 0835 11/03/16 1704 11/03/16 2214 11/04/16 0511 11/04/16 0823  HGB 15.0 12.4 12.1 11.8* 12.8  HCT 43.4 37.2 36.3 35.2* 37.7  PLT 192 163 125* 133* 112*  HEPARINUNFRC  --  0.42  0.45 <0.10*  --   --   CREATININE 0.82  --   --  0.57  --     Estimated Creatinine Clearance: 82.3 mL/min (by C-G formula based on SCr of 0.57 mg/dL).    Assessment: 45 yof continuing on heparin gtt for acute DVT extending length of whole leg. CTA negative for PE. Heparin was decreased to 500 units/hr post-lysis attempt to flow only through the lysis sheath (clarified with MD, heparin levels d/c'd for now - 1st level undetectable). Hg stable wnl, plt down 112. No bleed documented.  - 3/12 Thormbolysis: Following administration of 10 mg of TPA AngioJet catheter (2 attempts), there was no significant improvement in disease and with this, Vascular elected to place a lysis catheter for overnight treatment with alteplase 1mg /hr and heparin 500 units/hr.  Goal of Therapy:  Heparin level 0.2-0.5 Monitor platelets by anticoagulation protocol: Yes   Plan:  Heparin 500 units/hr (ONLY) through the lysis sheath. Alteplase 0.25mg /hr into lysis catheter Daily CBC (heparin levels d/c'd) F/u with Vascular Surgery if heparin needs to be increased to full dose?  Babs BertinHaley Jennae Hakeem, PharmD, BCPS Clinical Pharmacist 11/04/2016 10:35 AM    ADDENDUM:  Patient taken back to OR today for f/u lysis study for L leg DVT, successfully treated with overnight TPA and mechanical thrombectomy and subsequent stenting of the  common and external iliac vein per Vascular Surgery. Pharmacy consulted to restart heparin 2 hours post-sheath removal - confirmed full-dose heparin with standard heparin level goal 0.3-0.7 with Dr. Randie Heinzain.   Sheath removed at 1224 per procedure log. No bleed issues reported per discussion with RN.  Goal of Therapy:  Heparin level 0.3-0.7 Monitor platelets by anticoagulation protocol: Yes   Plan: No bolus Resume heparin at 1150 units/h at 1430 (2 hours post-sheath removal) 6h heparin level Daily heparin level/CBC Monitor for s/sx bleeding   Babs BertinHaley Robet Crutchfield, PharmD, BCPS Clinical Pharmacist 11/04/2016 1:37 PM

## 2016-11-04 NOTE — Progress Notes (Signed)
Bladder scan completed due to patients inability to void and having pain related to this.  Paged Dr. Edilia Boickson and orders received to place foley cath and remove 3/14 at 0600. Aline AugustNicole Normal Recinos RN, CCRN

## 2016-11-04 NOTE — Interval H&P Note (Signed)
History and Physical Interval Note:  11/04/2016 11:03 AM  Christina [name]  has presented today for surgery, with the diagnosis of DVT  The various methods of treatment have been discussed with the patient and family. After consideration of risks, benefits and other options for treatment, the patient has consented to  Procedure(s): Lower Extremity Venography (Left) as a surgical intervention .  The patient's history has been reviewed, patient examined, no change in status, stable for surgery.  I have reviewed the patient's chart and labs.  Questions were answered to the patient's satisfaction.     Durene CalBrabham, [place]

## 2016-11-04 NOTE — Op Note (Signed)
    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  Intravascular ultrasound (IVUS) left popliteal, femoral, common femoral, external iliac, common iliac, and inferior vena cava  2.  Follow up thrombolysis study  3.  Left leg venogram  4.  Mechanical thrombectomy, left femoral, common femoral, external iliac, common iliac vein  5.  Angioplasty, left femoral, common femoral vein  6.  Stent, left common and external iliac vein  7.  Conscious sedation) 70 minutes)   Indications:  The patient is brought back for follow-up from a lysis study for left leg DVT.  Procedure:  The patient was identified in the holding area and taken to room 8.  The patient was then placed supine on the table and prepped and draped in the usual sterile fashion.  A time out was called.  Conscious sedation was administered with the use of IV fentanyl and Versed in a continuous physician and nurse monitoring.  Heart rate, blood pressure, and oxygen saturation continuously monitored.  An Amplatz Super Stiff was laced through the unifuse catheter and positioned in the subclavian vein.  The IVUS catheter was used to evaluate the popliteal, femoral, common femoral, external iliac, common iliac and inferior vena cava.  This showed significant residual thrombus within the iliac venous system as well as in the femoral and distal common femoral vein.  Next, the Zalante catheter was used to perform mechanical thrombectomy of the femoral, common femoral, external iliac and common iliac vein.  A total of 166 secs were utilized.  After this was done, a 10 x 40 Mustang balloon was used to perform angioplasty of the femoral, common femoral, external iliac, and common iliac vein.  I then reevaluated the venous system using IVUS.  There was a significant  improvement in the diameter of the vein.  There were 2 areas up around L4-L5 as well as in the common femoral vein.  I elected to repeat mechanical thrombectomy using the Zalante device.  A venogram was then performed which showed in-line flow through the iliofemoral venous system into the inferior vena cava.  I then selected a stent so as to treat the common iliac vein compression.  A 18 x 90 wall stent was selected.  This was deployed landing at L4 in the vena cava.  This was dilated with a 14 mm Atlas balloon.  I then repeated balloon angioplasty of the common femoral vein.  The area was then evaluated again with IVUS and there was a significantly improved result.  A completion venogram was performed which showed in-line flow through the iliofemoral venous system.  At this point the decision made to terminate the procedure.  Catheters and wires were removed.  The patient taken the holding area for sheath pull.     Impression:  #1  May Thurner syndrome successfully treated with overnight TPA and mechanical thrombectomy and subsequent stenting of the common and external iliac vein using an 18 x 9 wall stent  V. Durene CalWells Brabham, M.D. [facility] [place] Office: [phone] Pager:  [phone]

## 2016-11-05 LAB — BASIC METABOLIC PANEL
Anion gap: 6 (ref 5–15)
BUN: <5 mg/dL — ABNORMAL LOW (ref 6–20)
CO2: 25 mmol/L (ref 22–32)
CREATININE: 0.62 mg/dL (ref 0.44–1.00)
Calcium: 8 mg/dL — ABNORMAL LOW (ref 8.9–10.3)
Chloride: 108 mmol/L (ref 101–111)
Glucose, Bld: 106 mg/dL — ABNORMAL HIGH (ref 65–99)
Potassium: 3.3 mmol/L — ABNORMAL LOW (ref 3.5–5.1)
SODIUM: 139 mmol/L (ref 135–145)

## 2016-11-05 LAB — HEPARIN LEVEL (UNFRACTIONATED): HEPARIN UNFRACTIONATED: 0.34 [IU]/mL (ref 0.30–0.70)

## 2016-11-05 LAB — CBC
HEMATOCRIT: 30.2 % — AB (ref 36.0–46.0)
HEMOGLOBIN: 10.2 g/dL — AB (ref 12.0–15.0)
MCH: 28.4 pg (ref 26.0–34.0)
MCHC: 33.8 g/dL (ref 30.0–36.0)
MCV: 84.1 fL (ref 78.0–100.0)
Platelets: 114 10*3/uL — ABNORMAL LOW (ref 150–400)
RBC: 3.59 MIL/uL — ABNORMAL LOW (ref 3.87–5.11)
RDW: 13.4 % (ref 11.5–15.5)
WBC: 5.3 10*3/uL (ref 4.0–10.5)

## 2016-11-05 MED ORDER — POTASSIUM CHLORIDE CRYS ER 20 MEQ PO TBCR
20.0000 meq | EXTENDED_RELEASE_TABLET | Freq: Once | ORAL | Status: AC
  Administered 2016-11-05: 20 meq via ORAL
  Filled 2016-11-05: qty 1

## 2016-11-05 MED FILL — Heparin Sodium (Porcine) 2 Unit/ML in Sodium Chloride 0.9%: INTRAMUSCULAR | Qty: 1000 | Status: AC

## 2016-11-05 NOTE — Progress Notes (Signed)
    Subjective  - POD #1  Left foot hurts when she puts weight on it.  Similar to pre-op, maybe a little better   Physical Exam:  Left leg edema, tender to touch       Assessment/Plan:  POD #1  Awaiting care management to check on cost of DOAC for outpatient treatment D/c foley Transfer to floor Will need thigh high compression at discharge   Durene CalBrabham, Wells 11/05/2016 4:34 PM --  Vitals:   11/05/16 1212 11/05/16 1546  BP:    Pulse:    Resp:    Temp: 98.6 F (37 C) 98.6 F (37 C)    Intake/Output Summary (Last 24 hours) at 11/05/16 1634 Last data filed at 11/05/16 1500  Gross per 24 hour  Intake             2933 ml  Output             3950 ml  Net            -1017 ml     Laboratory CBC    Component Value Date/Time   WBC 5.3 11/05/2016 0229   HGB 10.2 (L) 11/05/2016 0229   HCT 30.2 (L) 11/05/2016 0229   PLT 114 (L) 11/05/2016 0229    BMET    Component Value Date/Time   NA 139 11/05/2016 0824   K 3.3 (L) 11/05/2016 0824   CL 108 11/05/2016 0824   CO2 25 11/05/2016 0824   GLUCOSE 106 (H) 11/05/2016 0824   BUN <5 (L) 11/05/2016 0824   CREATININE 0.62 11/05/2016 0824   CALCIUM 8.0 (L) 11/05/2016 0824   GFRNONAA >60 11/05/2016 0824   GFRAA >60 11/05/2016 0824    COAG No results found for: INR, PROTIME No results found for: PTT  Antibiotics Anti-infectives    None       V. Charlena CrossWells Brabham IV, M.D. [facility] [place] Office: [phone] Pager:  [phone]

## 2016-11-05 NOTE — Progress Notes (Signed)
  Progress Note    11/05/2016 8:14 AM 1 Day Post-Op  Subjective:  Feeling hungry  Vitals:   11/05/16 0600 11/05/16 0700  BP: 109/73 114/70  Pulse: 98 99  Resp: 14 16  Temp:      Physical Exam: aaox3 Neuro in tact Abdomen is soft Left leg with mild ttp Sheath site without hematoma  CBC    Component Value Date/Time   WBC 5.3 11/05/2016 0229   RBC 3.59 (L) 11/05/2016 0229   HGB 10.2 (L) 11/05/2016 0229   HCT 30.2 (L) 11/05/2016 0229   PLT 114 (L) 11/05/2016 0229   MCV 84.1 11/05/2016 0229   MCH 28.4 11/05/2016 0229   MCHC 33.8 11/05/2016 0229   RDW 13.4 11/05/2016 0229   LYMPHSABS 1.7 11/03/2016 0835   MONOABS 0.8 11/03/2016 0835   EOSABS 0.1 11/03/2016 0835   BASOSABS 0.0 11/03/2016 0835    BMET    Component Value Date/Time   NA 135 11/04/2016 0511   K 3.6 11/04/2016 0511   CL 103 11/04/2016 0511   CO2 26 11/04/2016 0511   GLUCOSE 98 11/04/2016 0511   BUN <5 (L) 11/04/2016 0511   CREATININE 0.57 11/04/2016 0511   CALCIUM 8.2 (L) 11/04/2016 0511   GFRNONAA >60 11/04/2016 0511   GFRAA >60 11/04/2016 0511    INR No results found for: INR   Intake/Output Summary (Last 24 hours) at 11/05/16 0814 Last data filed at 11/05/16 0700  Gross per 24 hour  Intake          2693.25 ml  Output             2750 ml  Net           -56.75 ml     Assessment:  46 y.o. female is s/p lysis and stenting for may-thurner with large left leg clot burden  Plan: Transfer to floor F/u bmp D/c foley catheter Regular diet Transition heparin to novel agent aspirin   Brandon C. Randie Heinzain, MD [facility] [place] Office: [phone] Pager: [phone]  11/05/2016 8:14 AM

## 2016-11-05 NOTE — Progress Notes (Signed)
ANTICOAGULATION CONSULT NOTE - Follow Up Consult  Pharmacy Consult for heparin Indication: DVT  Labs:  Recent Labs  11/03/16 0835  11/03/16 2214 11/04/16 0511 11/04/16 0823 11/04/16 2022 11/05/16 0229  HGB 15.0  < > 12.1 11.8* 12.8  --  10.2*  HCT 43.4  < > 36.3 35.2* 37.7  --  30.2*  PLT 192  < > 125* 133* 112*  --  114*  HEPARINUNFRC  --   < > <0.10*  --   --  0.32 0.34  CREATININE 0.82  --   --  0.57  --   --   --   < > = values in this interval not displayed.   Assessment/Plan:  46yo female remains therapeutic on heparin. Will continue gtt at current rate and confirm stable with daily level.   Christina [name], PharmD, BCPS  11/05/2016,3:09 AM

## 2016-11-06 ENCOUNTER — Other Ambulatory Visit: Payer: Self-pay | Admitting: *Deleted

## 2016-11-06 DIAGNOSIS — I82402 Acute embolism and thrombosis of unspecified deep veins of left lower extremity: Secondary | ICD-10-CM

## 2016-11-06 LAB — RENAL FUNCTION PANEL
Albumin: 2.2 g/dL — ABNORMAL LOW (ref 3.5–5.0)
Anion gap: 3 — ABNORMAL LOW (ref 5–15)
CALCIUM: 7.7 mg/dL — AB (ref 8.9–10.3)
CO2: 26 mmol/L (ref 22–32)
Chloride: 111 mmol/L (ref 101–111)
Creatinine, Ser: 0.54 mg/dL (ref 0.44–1.00)
GFR calc Af Amer: >60 mL/min (ref >60)
GLUCOSE: 98 mg/dL (ref 65–99)
PHOSPHORUS: 2.7 mg/dL (ref 2.5–4.6)
POTASSIUM: 3.3 mmol/L — AB (ref 3.5–5.1)
SODIUM: 140 mmol/L (ref 135–145)

## 2016-11-06 LAB — CBC
HCT: 28.9 % — ABNORMAL LOW (ref 36.0–46.0)
Hemoglobin: 9.6 g/dL — ABNORMAL LOW (ref 12.0–15.0)
MCH: 27.8 pg (ref 26.0–34.0)
MCHC: 33.2 g/dL (ref 30.0–36.0)
MCV: 83.8 fL (ref 78.0–100.0)
PLATELETS: 135 10*3/uL — AB (ref 150–400)
RBC: 3.45 MIL/uL — ABNORMAL LOW (ref 3.87–5.11)
RDW: 13.5 % (ref 11.5–15.5)
WBC: 4.4 10*3/uL (ref 4.0–10.5)

## 2016-11-06 LAB — HEPARIN LEVEL (UNFRACTIONATED): Heparin Unfractionated: 0.41 IU/mL (ref 0.30–0.70)

## 2016-11-06 MED ORDER — RIVAROXABAN 15 MG PO TABS
15.0000 mg | ORAL_TABLET | Freq: Two times a day (BID) | ORAL | Status: DC
  Administered 2016-11-06 – 2016-11-07 (×3): 15 mg via ORAL
  Filled 2016-11-06 (×5): qty 1

## 2016-11-06 MED ORDER — RIVAROXABAN 15 MG PO TABS
15.0000 mg | ORAL_TABLET | Freq: Two times a day (BID) | ORAL | Status: DC
  Filled 2016-11-06: qty 1

## 2016-11-06 MED ORDER — ZOLPIDEM TARTRATE 5 MG PO TABS
5.0000 mg | ORAL_TABLET | Freq: Once | ORAL | Status: AC
  Administered 2016-11-06: 5 mg via ORAL
  Filled 2016-11-06: qty 1

## 2016-11-06 MED ORDER — RIVAROXABAN 20 MG PO TABS: 20.0000 mg | ORAL_TABLET | Freq: Every day | ORAL | Status: DC

## 2016-11-06 MED FILL — Heparin Sodium (Porcine) Inj 1000 Unit/ML: INTRAMUSCULAR | Qty: 10 | Status: AC

## 2016-11-06 NOTE — Care Management Note (Signed)
Case Management Note  Patient Details  Name: Sonia Sideimuira Haggar MRN: [id number] Date of Birth: [birth year]  Subjective/Objective:  Patient is from home with grandmother, pta indep, she will be on xarelto at dc.  She has no insurance , no pcp.  NCM has scheduled [facility] at 10 am with Sindy Messingoger Gomez PA. This is the sister clinic to our [facility].  She will need med ast at dc with other meds, if she is dc on Friday, she can still go to [facility] clinic to get meds at discount price. They close at 5:00pm.   Also NCM gave her a 30 day free coupon for the xarelto.  NCM also gave her a brochure for the [facility] clinic.  [facility], while she is waiting for the patient ast, her refill for the xarelto after she has used the 30 days will be $10. NCM will cont to follow for dc needs.                  Action/Plan:   Expected Discharge Date:   (pending)               Expected Discharge Plan:  Home/Self Care  In-House Referral:     Discharge planning Services  CM Consult, Medication Assistance, Follow-up appt scheduled, Indigent [facility] Clinic  Post Acute Care Choice:    Choice offered to:     DME Arranged:    DME Agency:     HH Arranged:    HH Agency:     Status of Service:  Completed, signed off  If discussed at [employer] Length of Stay Meetings, dates discussed:    Additional Comments:  Leone Havenaylor, Bowyn Mercier Clinton, RN 11/06/2016, 12:10 PM

## 2016-11-06 NOTE — Progress Notes (Addendum)
ANTICOAGULATION CONSULT NOTE - Follow Up Consult  Pharmacy Consult for Heparin Indication: DVT  No Known Allergies  Patient Measurements: Height: 5\' 6"  (167.6 cm) Weight: 146 lb (66.2 kg) IBW/kg (Calculated) : 59.3  Vital Signs: Temp: 98.9 F (37.2 C) (03/15 0745) Temp Source: Oral (03/15 0745) BP: 135/91 (03/15 0800) Pulse Rate: 86 (03/15 0800)  Labs:  Recent Labs  11/04/16 0511 11/04/16 0823 11/04/16 2022 11/05/16 0229 11/05/16 0824 11/06/16 0318  HGB 11.8* 12.8  --  10.2*  --  9.6*  HCT 35.2* 37.7  --  30.2*  --  28.9*  PLT 133* 112*  --  114*  --  135*  HEPARINUNFRC  --   --  0.32 0.34  --  0.41  CREATININE 0.57  --   --   --  0.62 0.54    Estimated Creatinine Clearance: 82.3 mL/min (by C-G formula based on SCr of 0.54 mg/dL).   Medications:  Scheduled:  . aspirin EC  81 mg Oral Daily  . docusate sodium  100 mg Oral Daily  . metoprolol  5 mg Intravenous Q6H    Assessment: 46yo female with DVT s/p lysis & mechanical thrombectomy + stenting of common/external iliac veins. Continuing on heparin at full dose post-op. Heparin level remains at goal 0.41. Some oozing this admit but not noted to be of significance per RN, MD notes.  Goal of Therapy:  Heparin level 0.3-0.7 units/ml Monitor platelets by anticoagulation protocol: Yes   Plan:  Continue heparin at 1150 units/hr Daily heparin level/CBC Monitor for s/sx bleeding F/u long-term anticoagulation plan - likely to NOAC soon per notes   Babs BertinHaley Cartina Brousseau, PharmD, BCPS Clinical Pharmacist 11/06/2016 8:54 AM    ADDENDUM:  MD to transition from heparin to Xarelto. Renal function appropriate. Communicated with RN to d/c heparin IV at time of 1st of of Xarelto.   Plan: D/c heparin IV > Xarelto 15mg  PO BID with meals x 21 days; then 20mg  PO Qsupper Monitor CBC, s/sx bleeding   Babs BertinHaley Hartford Maulden, PharmD, BCPS Clinical Pharmacist 11/06/2016 12:05 PM

## 2016-11-06 NOTE — Progress Notes (Addendum)
  [facility] Progress Note  Subjective  - POD #2  Left leg still painful  Objective Vitals:   11/06/16 0900 11/06/16 1000  BP:    Pulse:    Resp: 15 16  Temp:      Intake/Output Summary (Last 24 hours) at 11/06/16 1021 Last data filed at 11/06/16 1000  Gross per 24 hour  Intake          4028.83 ml  Output             2000 ml  Net          2028.83 ml   Left leg swelling significantly improved.  Assessment/Planning: 46 y.o. female with May Thurner Syndrome s/p: lysis left lower extremity DVT, stenting left common iliac and external iliac veins 2 Days Post-Op   Thigh high ted hose to left leg Currently on heparin. Will need to figure anticoagulation given lack of insurance and inability to afford NOAC and INR labs for coumadin. Have asked CSW for assistance.  Awaiting bed on 2W. Can potentially discharge if anticoagulation plan is made.   Raymond GurneyKimberly A Trinh 11/06/2016 10:21 AM --  Laboratory CBC    Component Value Date/Time   WBC 4.4 11/06/2016 0318   HGB 9.6 (L) 11/06/2016 0318   HCT 28.9 (L) 11/06/2016 0318   PLT 135 (L) 11/06/2016 0318    BMET    Component Value Date/Time   NA 140 11/06/2016 0318   K 3.3 (L) 11/06/2016 0318   CL 111 11/06/2016 0318   CO2 26 11/06/2016 0318   GLUCOSE 98 11/06/2016 0318   BUN <5 (L) 11/06/2016 0318   CREATININE 0.54 11/06/2016 0318   CALCIUM 7.7 (L) 11/06/2016 0318   GFRNONAA >60 11/06/2016 0318   GFRAA >60 11/06/2016 0318    COAG No results found for: INR, PROTIME No results found for: PTT  Antibiotics Anti-infectives    None       Maris BergerKimberly Trinh, PA-C [facility] Office: [phone] Pager: [phone] 11/06/2016 10:21 AM   I have independently interviewed patient and agree with PA assessment and plan above. Will need to f/u in 4-6 weeks with ivc/iliac vein duplex when anticoagulation figured out.   Tejasvi Brissett C. Randie Heinzain, MD [facility]  [place] Office: [phone] Pager: [phone]

## 2016-11-06 NOTE — Discharge Instructions (Addendum)
COMPRESSION STOCKINGS  Please go to medical supply store and obtain thigh high 20-30 mmHg compression stockings. Wear these stockings when you first wake up every morning and take off at night. You may take these off to take a shower.    Information on my medicine - XARELTO (rivaroxaban)  This medication education was reviewed with me or my healthcare representative as part of my discharge preparation.  WHY WAS XARELTO PRESCRIBED FOR YOU? Xarelto was prescribed to treat blood clots that may have been found in the veins of your legs (deep vein thrombosis) or in your lungs (pulmonary embolism) and to reduce the risk of them occurring again.  What do you need to know about Xarelto? The starting dose is one 15 mg tablet taken TWICE daily with food for the FIRST 21 DAYS then on (enter date)  11/27/2016  the dose is changed to one 20 mg tablet taken ONCE A DAY with your evening meal.  DO NOT stop taking Xarelto without talking to the health care provider who prescribed the medication.  Refill your prescription for 20 mg tablets before you run out.  After discharge, you should have regular check-up appointments with your healthcare provider that is prescribing your Xarelto.  In the future your dose may need to be changed if your kidney function changes by a significant amount.  What do you do if you miss a dose? If you are taking Xarelto TWICE DAILY and you miss a dose, take it as soon as you remember. You may take two 15 mg tablets (total 30 mg) at the same time then resume your regularly scheduled 15 mg twice daily the next day.  If you are taking Xarelto ONCE DAILY and you miss a dose, take it as soon as you remember on the same day then continue your regularly scheduled once daily regimen the next day. Do not take two doses of Xarelto at the same time.   Important Safety Information Xarelto is a blood thinner medicine that can cause bleeding. You should call your healthcare provider  right away if you experience any of the following: ? Bleeding from an injury or your nose that does not stop. ? Unusual colored urine (red or dark brown) or unusual colored stools (red or black). ? Unusual bruising for unknown reasons. ? A serious fall or if you hit your head (even if there is no bleeding).  Some medicines may interact with Xarelto and might increase your risk of bleeding while on Xarelto. To help avoid this, consult your healthcare provider or pharmacist prior to using any new prescription or non-prescription medications, including herbals, vitamins, non-steroidal anti-inflammatory drugs (NSAIDs) and supplements.  This website has more information on Xarelto: [email or web address].xarelto.[email or web address].

## 2016-11-06 NOTE — Progress Notes (Signed)
Report called to 2W; pt to transfer to 2W21; pt to start Xarelto today; will give 1st dose prior to transfer; will cont. To monitor.  Benjamine SpragueYates, Lashawne Dura A

## 2016-11-06 NOTE — Progress Notes (Signed)
Pt transferred to 2W21 via wheelchair; pt chart, belongings, and SCD's along for transfer; pt assisted to bed upon arrival; RN at bedside.  Benjamine SpragueYates, Nelli Swalley A

## 2016-11-06 NOTE — Progress Notes (Signed)
Patient arrived on the unit from 2S, assessment completed see flowsheet, placed on tele CCMD notified,  patient oriented to room and staff, bed in lowest position, call bell within reach will continue to monitor

## 2016-11-07 ENCOUNTER — Telehealth: Payer: Self-pay | Admitting: Vascular Surgery

## 2016-11-07 MED ORDER — RIVAROXABAN 20 MG PO TABS: 20.0000 mg | ORAL_TABLET | Freq: Every day | ORAL | 6 refills | Status: DC

## 2016-11-07 MED ORDER — HYDROCODONE-ACETAMINOPHEN 5-325 MG PO TABS: ORAL_TABLET | ORAL | 0 refills | Status: DC

## 2016-11-07 MED ORDER — RIVAROXABAN (XARELTO) VTE STARTER PACK (15 & 20 MG): ORAL_TABLET | ORAL | 0 refills | Status: DC

## 2016-11-07 MED ORDER — DIPHENHYDRAMINE HCL 50 MG PO TABS: 50.0000 mg | ORAL_TABLET | Freq: Every evening | ORAL | 0 refills | Status: DC | PRN

## 2016-11-07 MED FILL — HYDROCODON-APAP 5-325: 5-325 | 2 days supply | Qty: 10 | Fill #0

## 2016-11-07 MED FILL — XARELTO STARTER PACK: 15 & 20 | 28 days supply | Qty: 51 | Fill #0

## 2016-11-07 NOTE — Telephone Encounter (Signed)
Scheduled 5/4@8am

## 2016-11-07 NOTE — Progress Notes (Addendum)
Patient in a stable condition, discharge education reviewed with patient she verbalised understanding, paper prescriptions given to patient, patient belongings at bedside, iv removed, tele dc ccmd notified, patient taken off the unit on a wheelchair by a hospital volunteer.

## 2016-11-07 NOTE — Progress Notes (Addendum)
  [facility] Progress Note  Subjective  - POD #3  Ready to go home.   Objective Vitals:   11/06/16 2133 11/07/16 0443  BP: 134/72 113/75  Pulse: 81 96  Resp: 18 18  Temp: 98.2 F (36.8 C) 98.8 F (37.1 C)    Intake/Output Summary (Last 24 hours) at 11/07/16 0727 Last data filed at 11/06/16 1200  Gross per 24 hour  Intake          1816.33 ml  Output                0 ml  Net          1816.33 ml   Left leg swelling minimal edema. Left popliteal space soft.   Assessment/Planning: 46 y.o. female is s/p:  lysis left lower extremity DVT, stenting left common iliac and external iliac veins 3 Days Post-Op   D/c today with xarelto starter pack Appreciate case management assistance with medication Patient given instructions to obtain compression stockings at d/c F/u in 4-6 weeks with iliocaval duplex  Christina GurneyKimberly A [name] 11/07/2016 7:27 AM --  Laboratory CBC    Component Value Date/Time   WBC 4.4 11/06/2016 0318   HGB 9.6 (L) 11/06/2016 0318   HCT 28.9 (L) 11/06/2016 0318   PLT 135 (L) 11/06/2016 0318    BMET    Component Value Date/Time   NA 140 11/06/2016 0318   K 3.3 (L) 11/06/2016 0318   CL 111 11/06/2016 0318   CO2 26 11/06/2016 0318   GLUCOSE 98 11/06/2016 0318   BUN <5 (L) 11/06/2016 0318   CREATININE 0.54 11/06/2016 0318   CALCIUM 7.7 (L) 11/06/2016 0318   GFRNONAA >60 11/06/2016 0318   GFRAA >60 11/06/2016 0318    COAG No results found for: INR, PROTIME No results found for: PTT  Antibiotics Anti-infectives    None       Christina BergerKimberly [name], Christina [name] [facility] Office: [phone] Pager: [phone] 11/07/2016 7:27 AM   Agree with the above Left leg feels much better than when she came in Xaralto starter pack given plus financial assistance program info Has appt with PCP on Monday Will get 20-30 compression stockings at d/c.  Wearing TED currently Ready for d/c this am  Christina [name]

## 2016-11-07 NOTE — Telephone Encounter (Signed)
-----   Message from Sharee PimpleMarilyn K McChesney, RN sent at 11/06/2016  2:17 PM EDT ----- Regarding: 4-6 weeks   ----- Message ----- From: Lars MageEmma M Collins, PA-C Sent: 11/06/2016  12:54 PM To: V[facility] Charge Pool   Will need to f/u in 4-6 weeks with ivc/iliac vein duplex F/U with Dr. Randie Heinzain

## 2016-11-09 NOTE — Discharge Summary (Signed)
[facility] Discharge Summary  Christina [name] 24-Oct-1970 46 y.o. female  [id number]  Admission Date: 11/03/2016  Discharge Date: 11/07/2016  Physician: Lemar LivingsBrandon Cain, MD  Admission Diagnosis: leg swelling  DVT DVT  HPI:   This is a 46 y.o. female who presents with a one-day history of left lower extremity pain. Her only previous medical history significant for back pain fully has never had procedures. Procedure the past is a right shoulder surgery. She does not have any strokes she does not have any ongoing bleeding and has not had recent procedures. Her period ended 3 days ago. She denies a running history of DVT or family history of DVT. She has not had recent injury to her left upper extremity. Approximately pain associated with swelling and heaviness. She denies any skin changes. She denies numbness to her leg. She is an everyday smoker.  Hospital Course:  The patient was admitted to the hospital and taken to the PV lab on 11/03/2016 and underwent:  1.  US guided cannulation of left popliteal vein 2.  Intravascular ultrasound of left femoral, common femoral, external iliac, common iliac veins and ivc 3.  Thrombolysis of left common iliac, external iliac and common femoral veins 4.  Placement of 50cm treatment length lysis catheter from ivc to femoral vein 5.  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 There is minimal flow in the left common iliac vein more peripherally and the external iliac vein is occluded. There is significant disease of the left common femoral vein and the profunda vein appears patent. There is intermittent disease of the left femoral vein throughout the thigh. Following administration of 10 mg of TPA AngioJet catheter there was no significant improvement disease and with this we elected to place a lysis catheter for overnight  treated.  The patient tolerated the procedure well and was transported to the PACU in stable condition. She was transported to the ICU with a lysis catheter and heparin drip.   [facility] follow-up study. She underwent:     1.  Intravascular ultrasound (IVUS) left popliteal, femoral, common femoral, external iliac, common iliac, and inferior vena cava 2.  Follow up thrombolysis study 3.  Left leg venogram 4.  Mechanical thrombectomy, left femoral, common femoral, external iliac, common iliac vein 5.  Angioplasty, left femoral, common femoral vein 6.  Stent, left common and external iliac vein 7.  Conscious sedation) 70 minutes)  The patient's left leg edema continued to improve over her remaining hospitalization. Financial assistance was obtained for Xarelto. By POD 3, the patient's pain was well controlled and her left leg swelling was essentially resolved. She was discharged home on 11/07/16 in good condition.      CBC    Component Value Date/Time   WBC 4.4 11/06/2016 0318   RBC 3.45 (L) 11/06/2016 0318   HGB 9.6 (L) 11/06/2016 0318   HCT 28.9 (L) 11/06/2016 0318   PLT 135 (L) 11/06/2016 0318   MCV 83.8 11/06/2016 0318   MCH 27.8 11/06/2016 0318   MCHC 33.2 11/06/2016 0318   RDW 13.5 11/06/2016 0318   LYMPHSABS 1.7 11/03/2016 0835   MONOABS 0.8 11/03/2016 0835   EOSABS 0.1 11/03/2016 0835   BASOSABS 0.0 11/03/2016 0835    BMET    Component Value Date/Time   NA 140 11/06/2016 0318  K 3.3 (L) 11/06/2016 0318   CL 111 11/06/2016 0318   CO2 26 11/06/2016 0318   GLUCOSE 98 11/06/2016 0318   BUN <5 (L) 11/06/2016 0318   CREATININE 0.54 11/06/2016 0318   CALCIUM 7.7 (L) 11/06/2016 0318   GFRNONAA >60 11/06/2016 0318   GFRAA >60 11/06/2016 0318     Discharge Instructions:   The patient is discharged to home with extensive instructions on wound care and progressive ambulation.  They are instructed not to drive or perform any heavy lifting  until returning to see the physician in his office.  Discharge Instructions    Activity as tolerated - No restrictions    Complete by:  As directed    Call MD for:  redness, tenderness, or signs of infection (pain, swelling, bleeding, redness, odor or green/yellow discharge around incision site)    Complete by:  As directed    Call MD for:  severe or increased pain, loss or decreased feeling  in affected limb(s)    Complete by:  As directed    Call MD for:  temperature >100.5    Complete by:  As directed    Discharge wound care:    Complete by:  As directed    Take off dressing behind left knee when you get home. You can shower as normal. You do not have to reapply a dressing.   Driving Restrictions    Complete by:  As directed    No driving while on pain medication   Lifting restrictions    Complete by:  As directed    No lifting for 1 week   Resume previous diet    Complete by:  As directed       Discharge Diagnosis:  leg swelling  DVT DVT  Secondary Diagnosis: Patient Active Problem List   Diagnosis Date Noted  . DVT (deep venous thrombosis) (HCC) 11/03/2016   Past Medical History:  Diagnosis Date  . Anxiety   . Back pain      Allergies as of 11/07/2016   No Known Allergies     Medication List    STOP taking these medications   diphenhydramine-acetaminophen 25-500 MG Tabs tablet Commonly known as:  TYLENOL PM     TAKE these medications   acetaminophen 500 MG tablet Commonly known as:  TYLENOL Take 500 mg by mouth every 6 (six) hours as needed for mild pain or moderate pain.   cetirizine 10 MG tablet Commonly known as:  ZYRTEC Take 10 mg by mouth daily as needed for allergies.   clonazePAM 0.5 MG tablet Commonly known as:  KLONOPIN Take 0.5 mg by mouth 2 (two) times daily as needed for anxiety.   cyclobenzaprine 10 MG tablet Commonly known as:  FLEXERIL Take 1 tablet (10 mg total) by mouth 3 (three) times daily as needed for muscle spasms.     diphenhydrAMINE 50 MG tablet Commonly known as:  BENADRYL Take 1-2 tablets (50-100 mg total) by mouth at bedtime as needed for sleep.   HYDROcodone-acetaminophen 5-325 MG tablet Commonly known as:  NORCO/VICODIN Take 1-2 tablets by mouth every 6 hours as needed for pain and/or cough.   methocarbamol 500 MG tablet Commonly known as:  ROBAXIN Take 2 tablets (1,000 mg total) by mouth 4 (four) times daily as needed (Pain).   Rivaroxaban 15 & 20 MG Tbpk Take as directed on package: Start with one 15mg  tablet by mouth twice a day with food. On Day 22, switch to one 20mg  tablet once  a day with food.   rivaroxaban 20 MG Tabs tablet Commonly known as:  XARELTO Take 1 tablet (20 mg total) by mouth daily with supper. Start taking on:  12/08/2016       Vicodin #10 No Refill  Disposition: Home  Patient's condition: is Good   Follow up: 1. Dr. Randie Heinz in 4 weeks   Maris Berger, PA-C [facility] [phone] 11/09/2016  9:01 AM

## 2016-11-11 ENCOUNTER — Inpatient Hospital Stay (INDEPENDENT_AMBULATORY_CARE_PROVIDER_SITE_OTHER): Payer: Self-pay | Admitting: Physician Assistant

## 2016-11-11 ENCOUNTER — Encounter (INDEPENDENT_AMBULATORY_CARE_PROVIDER_SITE_OTHER): Payer: Self-pay | Admitting: Physician Assistant

## 2016-11-11 ENCOUNTER — Ambulatory Visit (INDEPENDENT_AMBULATORY_CARE_PROVIDER_SITE_OTHER): Payer: Self-pay | Admitting: Physician Assistant

## 2016-11-11 ENCOUNTER — Ambulatory Visit: Payer: Self-pay | Attending: Internal Medicine

## 2016-11-11 VITALS — BP 156/91 | HR 94 | Temp 99.1°F | Ht 66.0 in | Wt 153.4 lb

## 2016-11-11 DIAGNOSIS — I10 Essential (primary) hypertension: Secondary | ICD-10-CM

## 2016-11-11 DIAGNOSIS — I82412 Acute embolism and thrombosis of left femoral vein: Secondary | ICD-10-CM

## 2016-11-11 DIAGNOSIS — F418 Other specified anxiety disorders: Secondary | ICD-10-CM

## 2016-11-11 MED ORDER — TRAMADOL HCL 50 MG PO TABS: 50.0000 mg | ORAL_TABLET | Freq: Three times a day (TID) | ORAL | 0 refills | Status: AC | PRN

## 2016-11-11 MED ORDER — SERTRALINE HCL 50 MG PO TABS: 50.0000 mg | ORAL_TABLET | Freq: Every day | ORAL | 3 refills | Status: DC

## 2016-11-11 MED ORDER — HYDROCHLOROTHIAZIDE 12.5 MG PO TABS: 12.5000 mg | ORAL_TABLET | Freq: Every day | ORAL | 1 refills | Status: DC

## 2016-11-11 NOTE — Progress Notes (Signed)
Subjective:  Patient ID: Christina [name], female    DOB: 05/15/1971  Age: 46 y.o. MRN: [id number]  CC: f/u hospital admit for DVT  HPI Christina [name] is a 46 y.o. female with a PMH of anxiety presents for f/u of ED visit on /12/18. Was found to have a DVT in the LLE. US of LLE showed extensive evidence of acute deep vein thrombosis involving the common femoral, femoral, profunda femoral, popliteal, posterior tibial, and peroneal veins of the left lower extremity acute superficial vein thrombosis involving the great and lesser saphenous veins of the left lower. Was referred to vascular for embolectomy and had stent placed. CTA chest on 11/03/16 negative for PE. She did not have any personal hx or family hx that could lead to clot formation. Says she is currently in pain from the incision site on left lower leg for her stent. She is also depressed due to medical and financial hardships. Otherwise, she does not have any other complaints.  Outpatient Medications Prior to Visit  Medication Sig Dispense Refill  . methocarbamol (ROBAXIN) 500 MG tablet Take 2 tablets (1,000 mg total) by mouth 4 (four) times daily as needed (Pain). 20 tablet 0  . [START ON 12/08/2016] rivaroxaban (XARELTO) 20 MG TABS tablet Take 1 tablet (20 mg total) by mouth daily with supper. 30 tablet 6  . Rivaroxaban 15 & 20 MG TBPK Take as directed on package: Start with one 15mg  tablet by mouth twice a day with food. On Day 22, switch to one 20mg  tablet once a day with food. 51 each 0  . acetaminophen (TYLENOL) 500 MG tablet Take 500 mg by mouth every 6 (six) hours as needed for mild pain or moderate pain.    . cetirizine (ZYRTEC) 10 MG tablet Take 10 mg by mouth daily as needed for allergies.    . clonazePAM (KLONOPIN) 0.5 MG tablet Take 0.5 mg by mouth 2 (two) times daily as needed for anxiety.    . cyclobenzaprine (FLEXERIL) 10 MG tablet Take 1 tablet (10 mg total) by mouth 3 (three) times daily as needed for muscle spasms.  (Patient not taking: Reported on 11/11/2016) 30 tablet 0  . diphenhydrAMINE (BENADRYL) 50 MG tablet Take 1-2 tablets (50-100 mg total) by mouth at bedtime as needed for sleep. (Patient not taking: Reported on 11/11/2016) 30 tablet 0  . HYDROcodone-acetaminophen (NORCO/VICODIN) 5-325 MG tablet Take 1-2 tablets by mouth every 6 hours as needed for pain and/or cough. (Patient not taking: Reported on 11/11/2016) 10 tablet 0   No facility-administered medications prior to visit.      ROS Review of Systems  Constitutional: Negative for chills, fever and malaise/fatigue.  Eyes: Negative for blurred vision.  Respiratory: Negative for shortness of breath.   Cardiovascular: Negative for chest pain and palpitations.  Gastrointestinal: Negative for abdominal pain and nausea.  Genitourinary: Negative for dysuria and hematuria.  Musculoskeletal: Negative for joint pain and myalgias.       Left calf pain around incision site  Skin: Negative for rash.  Neurological: Negative for tingling and headaches.  Psychiatric/Behavioral: Negative for depression. The patient is not nervous/anxious.     Objective:  BP (!) 156/91   Pulse 94   Temp 99.1 F (37.3 C) (Oral)   Ht 5\' 6"  (1.676 m)   Wt 153 lb 6.4 oz (69.6 kg)   LMP 11/01/2016 (Exact Date)   SpO2 100%   BMI 24.76 kg/m   BP/Weight 11/11/2016 11/07/2016 11/03/2016  Systolic BP 156  133 -  Diastolic BP 91 77 -  Wt. (Lbs) 153.4 - 146  BMI 24.76 - 23.57      Physical Exam  Constitutional: She is oriented to person, place, and time.  Well developed, well nourished, NAD, polite  HENT:  Head: Normocephalic and atraumatic.  Eyes: No scleral icterus.  Neck: Normal range of motion. Neck supple. No thyromegaly present.  Cardiovascular: Normal rate, regular rhythm and normal heart sounds.   Pulmonary/Chest: Effort normal and breath sounds normal.  Musculoskeletal: She exhibits no edema.  Neurological: She is alert and oriented to person, place, and  time. Coordination normal.  Skin: Skin is warm and dry. No rash noted. No erythema. No pallor.  Incision site on left calf healing well with no signs of infection.  Psychiatric: Her behavior is normal. Thought content normal.  Somewhat depressed  Vitals reviewed.    Assessment & Plan:   1. Acute deep vein thrombosis (DVT) of femoral vein of left lower extremity (HCC) - followed by vascular  2. Depression with anxiety -PHQ9 score 24. GAD7 score 20. - sertraline (ZOLOFT) 50 MG tablet; Take 1 tablet (50 mg total) by mouth daily.  Dispense: 30 tablet; Refill: 3 - Ambulatory referral to Psychology  3. Hypertension, unspecified type - Comprehensive metabolic panel - TSH - hydrochlorothiazide (HYDRODIURIL) 12.5 MG tablet; Take 1 tablet (12.5 mg total) by mouth daily.  Dispense: 90 tablet; Refill: 1    Meds ordered this encounter  Medications  . sertraline (ZOLOFT) 50 MG tablet    Sig: Take 1 tablet (50 mg total) by mouth daily.    Dispense:  30 tablet    Refill:  3    Order Specific Question:   Supervising Provider    Answer:   Quentin Angst [id number]  . hydrochlorothiazide (HYDRODIURIL) 12.5 MG tablet    Sig: Take 1 tablet (12.5 mg total) by mouth daily.    Dispense:  90 tablet    Refill:  1    Order Specific Question:   Supervising Provider    Answer:   Quentin Angst [id number]  . traMADol (ULTRAM) 50 MG tablet    Sig: Take 1 tablet (50 mg total) by mouth every 8 (eight) hours as needed.    Dispense:  30 tablet    Refill:  0    Order Specific Question:   Supervising Provider    Answer:   Quentin Angst [id number]    Follow-up: Return in about 4 weeks (around 12/09/2016).   Loletta Specter PA

## 2016-11-11 NOTE — Patient Instructions (Addendum)
Please return here with your [facility] card and [facility] applications.  Major Depressive Disorder, Adult Major depressive disorder (MDD) is a mental health condition. It may also be called clinical depression or unipolar depression. MDD usually causes feelings of sadness, hopelessness, or helplessness. MDD can also cause physical symptoms. It can interfere with work, school, relationships, and other everyday activities. MDD may be mild, moderate, or severe. It may occur once (single episode major depressive disorder) or it may occur multiple times (recurrent major depressive disorder). What are the causes? The exact cause of this condition is not known. MDD is most likely caused by a combination of things, which may include:  Genetic factors. These are traits that are passed along from parent to child.  Individual factors. Your personality, your behavior, and the way you handle your thoughts and feelings may contribute to MDD. This includes personality traits and behaviors learned from others.  Physical factors, such as:  Differences in the part of your brain that controls emotion. This part of your brain may be different than it is in people who do not have MDD.  Long-term (chronic) medical or psychiatric illnesses.  Social factors. Traumatic experiences or major life changes may play a role in the development of MDD. What increases the risk? This condition is more likely to develop in women. The following factors may also make you more likely to develop MDD:  A family history of depression.  Troubled family relationships.  Abnormally low levels of certain brain chemicals.  Traumatic events in childhood, especially abuse or the loss of a parent.  Being under a lot of stress, or long-term stress, especially from upsetting life experiences or losses.  A history of:  Chronic physical illness.  Other mental health disorders.  Substance abuse.  Poor living  conditions.  Experiencing social exclusion or discrimination on a regular basis. What are the signs or symptoms? The main symptoms of MDD typically include:  Constant depressed or irritable mood.  Loss of interest in things and activities. MDD symptoms may also include:  Sleeping or eating too much or too little.  Unexplained weight change.  Fatigue or low energy.  Feelings of worthlessness or guilt.  Difficulty thinking clearly or making decisions.  Thoughts of suicide or of harming others.  Physical agitation or weakness.  Isolation. Severe cases of MDD may also occur with other symptoms, such as:  Delusions or hallucinations, in which you imagine things that are not real (psychotic depression).  Low-level depression that lasts at least a year (chronic depression or persistent depressive disorder).  Extreme sadness and hopelessness (melancholic depression).  Trouble speaking and moving (catatonic depression). How is this diagnosed? This condition may be diagnosed based on:  Your symptoms.  Your medical history, including your mental health history. This may involve tests to evaluate your mental health. You may be asked questions about your lifestyle, including any drug and alcohol use, and how long you have had symptoms of MDD.  A physical exam.  Blood tests to rule out other conditions. You must have a depressed mood and at least four other MDD symptoms most of the day, nearly every day in the same 2-week timeframe before your health care provider can confirm a diagnosis of MDD. How is this treated? This condition is usually treated by mental health professionals, such as psychologists, psychiatrists, and clinical social workers. You may need more than one type of treatment. Treatment may include:  Psychotherapy. This is also called talk therapy or counseling.  Types of psychotherapy include:  Cognitive behavioral therapy (CBT). This type of therapy teaches you to  recognize unhealthy feelings, thoughts, and behaviors, and replace them with positive thoughts and actions.  Interpersonal therapy (IPT). This helps you to improve the way you relate to and communicate with others.  Family therapy. This treatment includes members of your family.  Medicine to treat anxiety and depression, or to help you control certain emotions and behaviors.  Lifestyle changes, such as:  Limiting alcohol and drug use.  Exercising regularly.  Getting plenty of sleep.  Making healthy eating choices.  Spending more time outdoors. Treatments involving stimulation of the brain can be used in situations with extremely severe symptoms, or when medicine or other therapies do not work over time. These treatments include electroconvulsive therapy, transcranial magnetic stimulation, and vagal nerve stimulation. Follow these instructions at home: Activity   Return to your normal activities as told by your health care provider.  Exercise regularly and spend time outdoors as told by your health care provider. General instructions   Take over-the-counter and prescription medicines only as told by your health care provider.  Do not drink alcohol. If you drink alcohol, limit your alcohol intake to no more than 1 drink a day for nonpregnant women and 2 drinks a day for men. One drink equals 12 oz of beer, 5 oz of wine, or 1 oz of hard liquor. Alcohol can affect any antidepressant medicines you are taking. Talk to your health care provider about your alcohol use.  Eat a healthy diet and get plenty of sleep.  Find activities that you enjoy doing, and make time to do them.  Consider joining a support group. Your health care provider may be able to recommend a support group.  Keep all follow-up visits as told by your health care provider. This is important. Where to find more information: [employer] on Mental Illness  www.nami.org [place] [employer] of Mental  Health  http://www.maynard.net/ National Suicide Prevention Lifeline  1-800-273-TALK 906 261 3814). This is free, 24-hour help. Contact a health care provider if:  Your symptoms get worse.  You develop new symptoms. Get help right away if:  You self-harm.  You have serious thoughts about hurting yourself or others.  You see, hear, taste, smell, or feel things that are not present (hallucinate). This information is not intended to replace advice given to you by your health care provider. Make sure you discuss any questions you have with your health care provider. Document Released: 12/06/2012 Document Revised: 04/17/2016 Document Reviewed: 02/20/2016 Elsevier Interactive Patient Education  2017 Elsevier Inc. Deep Vein Thrombosis A deep vein thrombosis (DVT) is a blood clot (thrombus) that usually occurs in a deep, larger vein of the lower leg or the pelvis, or in an upper extremity such as the arm. These are dangerous and can lead to serious and even life-threatening complications if the clot travels to the lungs. A DVT can damage the valves in your leg veins so that instead of flowing upward, the blood pools in the lower leg. This is called post-thrombotic syndrome, and it can result in pain, swelling, discoloration, and sores on the leg. What are the causes? A DVT is caused by the formation of a blood clot in your leg, pelvis, or arm. Usually, several things contribute to the formation of blood clots. A clot may develop when:  Your blood flow slows down.  Your vein becomes damaged in some way.  You have a condition that makes your blood clot  more easily. What increases the risk? A DVT is more likely to develop in:  People who are older, especially over 33 years of age.  People who are overweight (obese).  People who sit or lie still for a long time, such as during long-distance travel (over 4 hours), bed rest, hospitalization, or during recovery from certain medical conditions like a  stroke.  People who do not engage in much physical activity (sedentary lifestyle).  People who have chronic breathing disorders.  People who have a personal or family history of blood clots or blood clotting disease.  People who have peripheral vascular disease (PVD), diabetes, or some types of cancer.  People who have heart disease, especially if the person had a recent heart attack or has congestive heart failure.  People who have neurological diseases that affect the legs (leg paresis).  People who have had a traumatic injury, such as breaking a hip or leg.  People who have recently had major or lengthy surgery, especially on the hip, knee, or abdomen.  People who have had a central line placed inside a large vein.  People who take medicines that contain the hormone estrogen. These include birth control pills and hormone replacement therapy.  Pregnancy or during childbirth or the postpartum period.  Long plane flights (over 8 hours). What are the signs or symptoms?   Symptoms of a DVT can include:  Swelling of your leg or arm, especially if one side is much worse.  Warmth and redness of your leg or arm, especially if one side is much worse.  Pain in your arm or leg. If the clot is in your leg, symptoms may be more noticeable or worse when you stand or walk.  A feeling of pins and needles, if the clot is in the arm. The symptoms of a DVT that has traveled to the lungs (pulmonary embolism, PE) usually start suddenly and include:  Shortness of breath while active or at rest.  Coughing or coughing up blood or blood-tinged mucus.  Chest pain that is often worse with deep breaths.  Rapid or irregular heartbeat.  Feeling light-headed or dizzy.  Fainting.  Feeling anxious.  Sweating. There may also be pain and swelling in a leg if that is where the blood clot started. These symptoms may represent a serious problem that is an emergency. Do not wait to see if the  symptoms will go away. Get medical help right away. Call your local emergency services (911 in the [place] Do not drive yourself to the hospital.  How is this diagnosed? Your health care provider will take a medical history and perform a physical exam. You may also have other tests, including:  Blood tests to assess the clotting properties of your blood.  Imaging tests, such as CT, ultrasound, MRI, X-ray, and other tests to see if you have clots anywhere in your body. How is this treated? After a DVT is identified, it can be treated. The type of treatment that you receive depends on many factors, such as the cause of your DVT, your risk for bleeding or developing more clots, and other medical conditions that you have. Sometimes, a combination of treatments is necessary. Treatment options may be combined and include:  Monitoring the blood clot with ultrasound.  Taking medicines by mouth, such as newer blood thinners (anticoagulants), thrombolytics, or warfarin.  Taking anticoagulant medicine by injection or through an IV tube.  Wearing compression stockings or using different types ofdevices.  Surgery (rare) to remove  the blood clot or to place a filter in your abdomen to stop the blood clot from traveling to your lungs. Treatments for a DVT are often divided into immediate treatment and long-term treatment (up to 3 months after DVT). You can work with your health care provider to choose the treatment program that is best for you. Follow these instructions at home: If you are taking a newer oral anticoagulant:   Take the medicine every single day at the same time each day.  Understand what foods and drugs interact with this medicine.  Understand that there are no regular blood tests required when using this medicine.  Understand the side effects of this medicine, including excessive bruising or bleeding. Ask your health care provider or pharmacist about other possible side effects. If  you are taking warfarin:   Understand how to take warfarin and know which foods can affect how warfarin works in Public relations account executive.  Understand that it is dangerous to take too much or too little warfarin. Too much warfarin increases the risk of bleeding. Too little warfarin continues to allow the risk for blood clots.  Follow your PT and INR blood testing schedule. The PT and INR results allow your health care provider to adjust your dose of warfarin. It is very important that you have your PT and INR tested as often as told by your health care provider.  Avoid major changes in your diet, or tell your health care provider before you change your diet. Arrange a visit with a registered dietitian to answer your questions. Many foods, especially foods that are high in vitamin K, can interfere with warfarin and affect the PT and INR results. Eat a consistent amount of foods that are high in vitamin K, such as:  Spinach, kale, broccoli, cabbage, collard greens, turnip greens, Brussels sprouts, peas, cauliflower, seaweed, and parsley.  Beef liver and pork liver.  Green tea.  Soybean oil.  Tell your health care provider about any and all medicines, vitamins, and supplements that you take, including aspirin and other over-the-counter anti-inflammatory medicines. Be especially cautious with aspirin and anti-inflammatory medicines. Do not take those before you ask your health care provider if it is safe to do so. This is important because many medicines can interfere with warfarin and affect the PT and INR results.  Do not start or stop taking any over-the-counter or prescription medicine unless your health care provider or pharmacist tells you to do so. If you take warfarin, you will also need to do these things:  Hold pressure over cuts for longer than usual.  Tell your dentist and other health care providers that you are taking warfarin before you have any procedures in which bleeding may occur.  Avoid  alcohol or drink very small amounts. Tell your health care provider if you change your alcohol intake.  Do not use tobacco products, including cigarettes, chewing tobacco, and e-cigarettes. If you need help quitting, ask your health care provider.  Avoid contact sports. General instructions   Take over-the-counter and prescription medicines only as told by your health care provider. Anticoagulant medicines can have side effects, including easy bruising and difficulty stopping bleeding. If you are prescribed an anticoagulant, you will also need to do these things:  Hold pressure over cuts for longer than usual.  Tell your dentist and other health care providers that you are taking anticoagulants before you have any procedures in which bleeding may occur.  Avoid contact sports.  Wear a medical alert bracelet or  carry a medical alert card that says you have had a PE.  Ask your health care provider how soon you can go back to your normal activities. Stay active to prevent new blood clots from forming.  Make sure to exercise while traveling or when you have been sitting or standing for a long period of time. It is very important to exercise. Exercise your legs by walking or by tightening and relaxing your leg muscles often. Take frequent walks.  Wear compression stockings as told by your health care provider to help prevent more blood clots from forming.  Do not use tobacco products, including cigarettes, chewing tobacco, and e-cigarettes. If you need help quitting, ask your health care provider.  Keep all follow-up appointments with your health care provider. This is important. How is this prevented? Take these actions to decrease your risk of developing another DVT:  Exercise regularly. For at least 30 minutes every day, engage in:  Activity that involves moving your arms and legs.  Activity that encourages good blood flow through your body by increasing your heart rate.  Exercise  your arms and legs every hour during long-distance travel (over 4 hours). Drink plenty of water and avoid drinking alcohol while traveling.  Avoid sitting or lying in bed for long periods of time without moving your legs.  Maintain a weight that is appropriate for your height. Ask your health care provider what weight is healthy for you.  If you are a woman who is over 47 years of age, avoid unnecessary use of medicines that contain estrogen. These include birth control pills.  Do not smoke, especially if you take estrogen medicines. If you need help quitting, ask your health care provider. If you are hospitalized, prevention measures may include:  Early walking after surgery, as soon as your health care provider says that it is safe.  Receiving anticoagulants to prevent blood clots.If you cannot take anticoagulants, other options may be available, such as wearing compression stockings or using different types of devices. Get help right away if:  You have new or increased pain, swelling, or redness in an arm or leg.  You have numbness or tingling in an arm or leg.  You have shortness of breath while active or at rest.  You have chest pain.  You have a rapid or irregular heartbeat.  You feel light-headed or dizzy.  You cough up blood.  You notice blood in your vomit, bowel movement, or urine. These symptoms may represent a serious problem that is an emergency. Do not wait to see if the symptoms will go away. Get medical help right away. Call your local emergency services (911 in the [place] Do not drive yourself to the hospital. This information is not intended to replace advice given to you by your health care provider. Make sure you discuss any questions you have with your health care provider. Document Released: 08/11/2005 Document Revised: 01/17/2016 Document Reviewed: 12/06/2014 Elsevier Interactive Patient Education  2017 [employer]

## 2016-11-12 LAB — COMPREHENSIVE METABOLIC PANEL
ALBUMIN: 3.3 g/dL — AB (ref 3.5–5.5)
ALK PHOS: 78 IU/L (ref 39–117)
ALT: 36 IU/L — ABNORMAL HIGH (ref 0–32)
AST: 26 IU/L (ref 0–40)
Albumin/Globulin Ratio: 0.8 — ABNORMAL LOW (ref 1.2–2.2)
BILIRUBIN TOTAL: 0.3 mg/dL (ref 0.0–1.2)
BUN / CREAT RATIO: 10 (ref 9–23)
BUN: 7 mg/dL (ref 6–24)
CHLORIDE: 103 mmol/L (ref 96–106)
CO2: 26 mmol/L (ref 18–29)
Calcium: 8.4 mg/dL — ABNORMAL LOW (ref 8.7–10.2)
Creatinine, Ser: 0.73 mg/dL (ref 0.57–1.00)
GFR calc Af Amer: 114 mL/min/{1.73_m2} (ref >59)
GFR calc non Af Amer: 99 mL/min/{1.73_m2} (ref >59)
GLOBULIN, TOTAL: 3.9 g/dL (ref 1.5–4.5)
Glucose: 85 mg/dL (ref 65–99)
POTASSIUM: 3.8 mmol/L (ref 3.5–5.2)
Sodium: 144 mmol/L (ref 134–144)
Total Protein: 7.2 g/dL (ref 6.0–8.5)

## 2016-11-12 LAB — TSH: TSH: 0.567 u[IU]/mL (ref 0.450–4.500)

## 2016-11-13 ENCOUNTER — Other Ambulatory Visit: Payer: Self-pay | Admitting: *Deleted

## 2016-11-13 MED ORDER — RIVAROXABAN 20 MG PO TABS: 20.0000 mg | ORAL_TABLET | Freq: Every day | ORAL | 3 refills | Status: DC

## 2016-11-13 NOTE — Telephone Encounter (Signed)
PRINTED FOR PASS PROGRAM 

## 2016-11-28 ENCOUNTER — Ambulatory Visit (INDEPENDENT_AMBULATORY_CARE_PROVIDER_SITE_OTHER): Payer: Self-pay

## 2016-12-04 MED FILL — XARELTO 20 MG TABLET: 20 | 30 days supply | Qty: 30 | Fill #0

## 2016-12-12 ENCOUNTER — Ambulatory Visit (INDEPENDENT_AMBULATORY_CARE_PROVIDER_SITE_OTHER): Payer: Self-pay | Admitting: Physician Assistant

## 2016-12-12 ENCOUNTER — Ambulatory Visit (INDEPENDENT_AMBULATORY_CARE_PROVIDER_SITE_OTHER): Payer: Self-pay

## 2016-12-17 ENCOUNTER — Encounter: Payer: Self-pay | Admitting: Vascular Surgery

## 2016-12-25 ENCOUNTER — Ambulatory Visit (INDEPENDENT_AMBULATORY_CARE_PROVIDER_SITE_OTHER): Payer: Self-pay | Admitting: Physician Assistant

## 2016-12-25 ENCOUNTER — Encounter (INDEPENDENT_AMBULATORY_CARE_PROVIDER_SITE_OTHER): Payer: Self-pay | Admitting: Physician Assistant

## 2016-12-25 VITALS — BP 118/80 | HR 131 | Temp 99.1°F | Wt 142.2 lb

## 2016-12-25 DIAGNOSIS — F418 Other specified anxiety disorders: Secondary | ICD-10-CM

## 2016-12-25 DIAGNOSIS — M5442 Lumbago with sciatica, left side: Secondary | ICD-10-CM

## 2016-12-25 DIAGNOSIS — G8929 Other chronic pain: Secondary | ICD-10-CM

## 2016-12-25 DIAGNOSIS — A599 Trichomoniasis, unspecified: Secondary | ICD-10-CM

## 2016-12-25 DIAGNOSIS — M5441 Lumbago with sciatica, right side: Secondary | ICD-10-CM

## 2016-12-25 MED ORDER — ACETAMINOPHEN-CODEINE #3 300-30 MG PO TABS: 1.0000 | ORAL_TABLET | Freq: Three times a day (TID) | ORAL | 0 refills | Status: AC | PRN

## 2016-12-25 MED ORDER — SERTRALINE HCL 100 MG PO TABS: 100.0000 mg | ORAL_TABLET | Freq: Every day | ORAL | 3 refills | Status: DC

## 2016-12-25 MED ORDER — METRONIDAZOLE 500 MG PO TABS: 500.0000 mg | ORAL_TABLET | Freq: Two times a day (BID) | ORAL | 0 refills | Status: DC

## 2016-12-25 MED ORDER — GABAPENTIN 300 MG PO CAPS: 300.0000 mg | ORAL_CAPSULE | Freq: Three times a day (TID) | ORAL | 3 refills | Status: DC

## 2016-12-25 NOTE — Patient Instructions (Signed)

## 2016-12-25 NOTE — Progress Notes (Signed)
Subjective:  Patient ID: Christina [name], female    DOB: 01/25/1971  Age: 46 y.o. MRN: [id number]  CC: exposure to STD  HPI Christina Sideimuira Jewell is a 46 y.o. female with a PMH of Anxiety and back pain. Presents today with concern for exposure to Trichomonas. Says her boyfriend of 20 years told her he was tested for STDs and was positive for trichomonas. Pt broke up with boyfriend as a result. No vaginal or vulvar  symptoms. Her anxiety is also increased partly due to STD concern, chronic back pain, lack of finances, taking care of her elderly relative. Has been taking Zoloft 50mg  but does not feel it is helping. Has been on Zoloft for approximately 5-6 weeks now.  [facility]. In regards to back pain, believes back pain to be worsening. Had XR L spine on 09/15/10 which found the following:   There is anatomic alignment of the vertebral bodies. There is no vertebral body height loss. Disk space narrowing at L4-5 is moderate. No pars defects.  Patient is desperately trying to receive help. Medicaid coverage decision is pending. [facility] or [facility] yet. Believes Medicaid will not be approved if she enrolls in these programs. Does not endorse any other symptoms.    Outpatient Medications Prior to Visit  Medication Sig Dispense Refill  . acetaminophen (TYLENOL) 500 MG tablet Take 500 mg by mouth every 6 (six) hours as needed for mild pain or moderate pain.    . cetirizine (ZYRTEC) 10 MG tablet Take 10 mg by mouth daily as needed for allergies.    . hydrochlorothiazide (HYDRODIURIL) 12.5 MG tablet Take 1 tablet (12.5 mg total) by mouth daily. 90 tablet 1  . methocarbamol (ROBAXIN) 500 MG tablet Take 2 tablets (1,000 mg total) by mouth 4 (four) times daily as needed (Pain). 20 tablet 0  . rivaroxaban (XARELTO) 20 MG TABS tablet Take 1 tablet (20 mg total) by  mouth daily with supper. 90 tablet 3  . Rivaroxaban 15 & 20 MG TBPK Take as directed on package: Start with one 15mg  tablet by mouth twice a day with food. On Day 22, switch to one 20mg  tablet once a day with food. 51 each 0  . sertraline (ZOLOFT) 50 MG tablet Take 1 tablet (50 mg total) by mouth daily. 30 tablet 3   No facility-administered medications prior to visit.      ROS Review of Systems  Constitutional: Negative for chills, fever and malaise/fatigue.  Eyes: Negative for blurred vision.  Respiratory: Negative for shortness of breath.   Cardiovascular: Negative for chest pain and palpitations.  Gastrointestinal: Negative for abdominal pain and nausea.  Genitourinary: Negative for dysuria and hematuria.  Musculoskeletal: Positive for back pain. Negative for joint pain and myalgias.  Skin: Negative for rash.  Neurological: Negative for tingling and headaches.  Psychiatric/Behavioral: Positive for depression. The patient is nervous/anxious.     Objective:  BP 118/80 (BP Location: Right Arm, Patient Position: Sitting, Cuff Size: Normal)   Pulse (!) 131   Temp 99.1 F (37.3 C) (Oral)   Wt 142 lb 3.2 oz (64.5 kg)   LMP 12/09/2016   SpO2 99%   BMI 22.95 kg/m   BP/Weight 12/25/2016 11/11/2016 11/07/2016  Systolic BP 118 156 133  Diastolic BP 80 91 77  Wt. (Lbs) 142.2 153.4 -  BMI 22.95 24.76 -  Physical Exam  Constitutional: She is oriented to person, place, and time.  Well developed, well nourished, NAD, polite  HENT:  Head: Normocephalic and atraumatic.  Eyes: No scleral icterus.  Neck: Normal range of motion. Neck supple. No thyromegaly present.  Cardiovascular: Normal rate, regular rhythm and normal heart sounds.   Pulmonary/Chest: Effort normal and breath sounds normal.  Musculoskeletal: She exhibits no edema.  Neurological: She is alert and oriented to person, place, and time. No cranial nerve deficit. Coordination normal.  Skin: Skin is warm and dry. No rash  noted. No erythema. No pallor.  Psychiatric: Her behavior is normal. Thought content normal.  Melancholic, frustrated  Vitals reviewed.    Assessment & Plan:   1. Trichomoniasis - Begin MetroNIDAZOLE (FLAGYL) 500 MG tablet; Take 1 tablet (500 mg total) by mouth 2 (two) times daily.  Dispense: 28 tablet; Refill: 0  2. Depression with anxiety - Increase sertraline (ZOLOFT) 100 MG tablet; Take 1 tablet (100 mg total) by mouth daily.  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  Dispense: 90 capsule; Refill: 3 - Begin acetaminophen-codeine (TYLENOL #3) 300-30 MG tablet; Take 1 tablet by mouth every 8 (eight) hours as needed for moderate pain.  Dispense: 21 tablet; Refill: 0 - DG Lumbar Spine Complete; Future   Meds ordered this encounter  Medications  . DISCONTD: metroNIDAZOLE (FLAGYL) 500 MG tablet    Sig: Take 1 tablet (500 mg total) by mouth 2 (two) times daily.    Dispense:  28 tablet    Refill:  0    Order Specific Question:   Supervising Provider    Answer:   Quentin Angst [id number]  . DISCONTD: sertraline (ZOLOFT) 100 MG tablet    Sig: Take 1 tablet (100 mg total) by mouth daily.    Dispense:  30 tablet    Refill:  3    Order Specific Question:   Supervising Provider    Answer:   Quentin Angst [id number]  . gabapentin (NEURONTIN) 300 MG capsule    Sig: Take 1 capsule (300 mg total) by mouth 3 (three) times daily.    Dispense:  90 capsule    Refill:  3    Order Specific Question:   Supervising Provider    Answer:   Quentin Angst [id number]  . acetaminophen-codeine (TYLENOL #3) 300-30 MG tablet    Sig: Take 1 tablet by mouth every 8 (eight) hours as needed for moderate pain.    Dispense:  21 tablet    Refill:  0    Order Specific Question:   Supervising Provider    Answer:   Quentin Angst  [id number]  . sertraline (ZOLOFT) 100 MG tablet    Sig: Take 1 tablet (100 mg total) by mouth daily.    Dispense:  30 tablet    Refill:  3    Order Specific Question:   Supervising Provider    Answer:   Quentin Angst [id number]  . metroNIDAZOLE (FLAGYL) 500 MG tablet    Sig: Take 1 tablet (500 mg total) by mouth 2 (two) times daily.    Dispense:  28 tablet    Refill:  0    Order Specific Question:   Supervising Provider    Answer:   Quentin Angst [id number]    Follow-up: Return in about 4 weeks (around  01/22/2017) for Depression with anxiety.   Loletta Specter PA

## 2016-12-26 ENCOUNTER — Encounter (HOSPITAL_COMMUNITY): Payer: Self-pay

## 2016-12-26 ENCOUNTER — Encounter: Payer: Self-pay | Admitting: Vascular Surgery

## 2017-01-02 ENCOUNTER — Emergency Department (HOSPITAL_COMMUNITY): Payer: Self-pay

## 2017-01-02 ENCOUNTER — Encounter (HOSPITAL_COMMUNITY): Payer: Self-pay | Admitting: *Deleted

## 2017-01-02 ENCOUNTER — Emergency Department (HOSPITAL_COMMUNITY)
Admission: EM | Admit: 2017-01-02 | Discharge: 2017-01-03 | Disposition: A | Discharge status: Home / Self Care | Payer: Self-pay | Attending: Emergency Medicine | Admitting: Emergency Medicine

## 2017-01-02 DIAGNOSIS — M545 Low back pain: Secondary | ICD-10-CM | POA: Insufficient documentation

## 2017-01-02 DIAGNOSIS — G8929 Other chronic pain: Secondary | ICD-10-CM | POA: Insufficient documentation

## 2017-01-02 DIAGNOSIS — F17200 Nicotine dependence, unspecified, uncomplicated: Secondary | ICD-10-CM | POA: Insufficient documentation

## 2017-01-02 DIAGNOSIS — Z7901 Long term (current) use of anticoagulants: Secondary | ICD-10-CM | POA: Insufficient documentation

## 2017-01-02 MED ORDER — DIAZEPAM 5 MG PO TABS
5.0000 mg | ORAL_TABLET | Freq: Once | ORAL | Status: AC
  Administered 2017-01-02: 5 mg via ORAL
  Filled 2017-01-02: qty 1

## 2017-01-02 MED ORDER — KETOROLAC TROMETHAMINE 60 MG/2ML IM SOLN
30.0000 mg | Freq: Once | INTRAMUSCULAR | Status: AC
  Administered 2017-01-02: 30 mg via INTRAMUSCULAR
  Filled 2017-01-02: qty 2

## 2017-01-02 NOTE — ED Triage Notes (Signed)
The pt is c/o lower back pain for 26 years after she had anepidural  And for the past 11 yesra also no recent injury    lmp 4 days ago

## 2017-01-02 NOTE — ED Notes (Signed)
Pt reports a 26 year history of chronic back pain that started after she had an epidural. Pt reports she is being followed by a PCP and has an MRI scheduled.
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
## 2017-01-02 NOTE — ED Provider Notes (Signed)
[facility]-EMERGENCY DEPT Provider Note   CSN: [id number] Arrival date & time: 01/02/17  1920  By signing my name below, I, Christina [name], attest that this documentation has been prepared under the direction and in the presence of Kerrie Buffalo, NP. Electronically Signed: Cynda [name], Scribe. 01/02/17. 9:39 PM.  History   Chief Complaint Chief Complaint  Patient presents with  . Back Pain   HPI Comments: Christina [name] is a 46 y.o. female with a history of chronic back pain and a DVT on xarelto, who presents to the Emergency Department complaining of chronic back pain, which acutely worsened several days ago. Patient states she began having sudden-onset lower back pain that radiates down the bilateral buttox. Patient states she has been unable to ambulate for the past few days. Patient reports having chronic back pain for the past 26 years ever since she had an epidural injection. Patient states she has not followed-up with anyone, due to her loss of insurance. Patient is currently prescribed gabapentin by her primary physician. Patient reports associated right leg pain. No relief with gabapentin. Patient states her pain is worse when she ambulates or attempts to stand up straight. Patient denies any fever, chills, nausea, vomiting, numbness, weakness, or bladder/bowel incontinence.   The history is provided by the patient. No language interpreter was used.    Past Medical History:  Diagnosis Date  . Anxiety   . Back pain     Patient Active Problem List   Diagnosis Date Noted  . DVT (deep venous thrombosis) (HCC) 11/03/2016    Past Surgical History:  Procedure Laterality Date  . INTRAVASCULAR ULTRASOUND/IVUS Left 11/03/2016   Procedure: Intravascular Ultrasound/IVUS;  Surgeon: Maeola Harman, MD;  Location: [facility] INVASIVE CV LAB;  Service: Cardiovascular;  Laterality: Left;  . LOWER EXTREMITY VENOGRAPHY Left 11/04/2016   Procedure: Lower Extremity Venography;  Surgeon: Nada Libman, MD;  Location: [facility] INVASIVE CV LAB;  Service: Cardiovascular;  Laterality: Left;  . PERIPHERAL VASCULAR INTERVENTION Left 11/04/2016   Procedure: Peripheral Vascular Intervention;  Surgeon: Nada Libman, MD;  Location[facility] INVASIVE CV LAB;  Service: Cardiovascular;  Laterality: Left;  common external  . PERIPHERAL VASCULAR THROMBECTOMY Left 11/03/2016   Procedure: Peripheral Vascular Thrombectomy;  Surgeon: Maeola Harman, MD;  Location: [facility] INVASIVE CV LAB;  Service: Cardiovascular;  Laterality: Left;    OB History    No data available       Home Medications    Prior to Admission medications   Medication Sig Start Date End Date Taking? Authorizing Provider  acetaminophen (TYLENOL) 500 MG tablet Take 500 mg by mouth every 6 (six) hours as needed for mild pain or moderate pain.    [provider]  cetirizine (ZYRTEC) 10 MG tablet Take 10 mg by mouth daily as needed for allergies.    [provider]  cyclobenzaprine (FLEXERIL) 10 MG tablet Take 1 tablet (10 mg total) by mouth 2 (two) times daily as needed for muscle spasms. 01/03/17   Janne Napoleon, NP  gabapentin (NEURONTIN) 300 MG capsule Take 1 capsule (300 mg total) by mouth 3 (three) times daily. 12/25/16   Loletta Specter, PA-C  hydrochlorothiazide (HYDRODIURIL) 12.5 MG tablet Take 1 tablet (12.5 mg total) by mouth daily. 11/11/16   Loletta Specter, PA-C  metroNIDAZOLE (FLAGYL) 500 MG tablet Take 1 tablet (500 mg total) by mouth 2 (two) times daily. 12/25/16   Loletta Specter, PA-C  rivaroxaban (XARELTO) 20 MG TABS tablet Take 1 tablet (  20 mg total) by mouth daily with supper. 12/08/16   Quentin Angst, MD  Rivaroxaban 15 & 20 MG TBPK Take as directed on package: Start with one 15mg  tablet by mouth twice a day with food. On Day 22, switch to one 20mg  tablet once a day with food. 11/07/16   Raymond Gurney, PA-C  sertraline (ZOLOFT) 100 MG tablet Take 1 tablet (100 mg total) by mouth daily.  12/25/16   Loletta Specter, PA-C    Family History No family history on file.  Social History Social History  Substance Use Topics  . Smoking status: Current Every Day Smoker  . Smokeless tobacco: Never Used  . Alcohol use No     Allergies   Patient has no known allergies.   Review of Systems Review of Systems  Constitutional: Negative for chills and fever.  Gastrointestinal: Negative for nausea and vomiting.  Musculoskeletal: Positive for arthralgias (buttock, right leg) and back pain. Negative for joint swelling.  Skin: Negative for wound.  Neurological: Negative for weakness and numbness.  Psychiatric/Behavioral: Negative for confusion.     Physical Exam Updated Vital Signs BP 130/87 (BP Location: Right Arm)   Pulse 81   Temp 98.7 F (37.1 C) (Oral)   Resp 18   Ht 5' 5.5" (1.664 m)   Wt 64.9 kg   LMP 12/29/2016   SpO2 100%   BMI 23.43 kg/m   Physical Exam  Constitutional: She appears well-developed and well-nourished. No distress.  HENT:  Head: Normocephalic and atraumatic.  Right Ear: Tympanic membrane normal.  Left Ear: Tympanic membrane normal.  Nose: Nose normal.  Mouth/Throat: Uvula is midline, oropharynx is clear and moist and mucous membranes are normal.  Eyes: EOM are normal.  Neck: Normal range of motion. Neck supple.  Cardiovascular: Normal rate and regular rhythm.   Pulmonary/Chest: Effort normal. She has no wheezes. She has no rales.  Abdominal: Soft. Bowel sounds are normal. There is no tenderness.  Musculoskeletal: Normal range of motion.       Lumbar back: She exhibits tenderness, pain and spasm. She exhibits normal pulse.  No calf tenderness  Neurological: She is alert. She has normal strength. Gait normal.  Reflex Scores:      Bicep reflexes are 2+ on the right side and 2+ on the left side.      Brachioradialis reflexes are 2+ on the right side and 2+ on the left side.      Patellar reflexes are 2+ on the right side and 2+ on the  left side.      Achilles reflexes are 2+ on the right side and 2+ on the left side. Skin: Skin is warm and dry.  Psychiatric: She has a normal mood and affect.  Nursing note and vitals reviewed.    ED Treatments / Results  DIAGNOSTIC STUDIES: Oxygen Saturation is 100% on RA, normal by my interpretation.    COORDINATION OF CARE: 9:38 PM Discussed treatment plan with pt at bedside and pt agreed to plan, which includes pain medication and imaging.   Labs (all labs ordered are listed, but only abnormal results are displayed) Labs Reviewed - No data to display  Radiology Dg Lumbar Spine Complete  Result Date: 01/03/2017 CLINICAL DATA:  Chronic lower back pain.  Initial encounter. EXAM: LUMBAR SPINE - COMPLETE 4+ VIEW COMPARISON:  Lumbar spine radiographs performed 04/01/2006 FINDINGS: There is no evidence of fracture or subluxation. Vertebral bodies demonstrate normal height and alignment. Intervertebral disc spaces are preserved. The  visualized neural foramina are grossly unremarkable in appearance. Minimal anterior osteophytes are seen along the lower lumbar spine. The visualized bowel gas pattern is unremarkable in appearance; air and stool are noted within the colon. The sacroiliac joints are within normal limits. A vascular stent is noted overlying the lower abdomen. IMPRESSION: No evidence of fracture or subluxation along the lumbar spine. Electronically Signed   By: Roanna RaiderJeffery  Chang M.D.   On: 01/03/2017 00:16    Procedures Procedures (including critical care time)  Medications Ordered in ED Medications  diazepam (VALIUM) tablet 5 mg (5 mg Oral Given 01/02/17 2316)  ketorolac (TORADOL) injection 30 mg (30 mg Intramuscular Given 01/02/17 2317)  oxyCODONE-acetaminophen (PERCOCET/ROXICET) 5-325 MG per tablet 1 tablet (1 tablet Oral Given 01/03/17 0135)     Initial Impression / Assessment and Plan / ED Course  I have reviewed the triage vital signs and the nursing notes.  Pertinent  imaging results that were available during my care of the patient were reviewed by me and considered in my medical decision making (see chart for details).     11:29 PM Patient sleeping in room after given medications.    Final Clinical Impressions(s) / ED Diagnoses  46 y.o. female with acute on chronic low back pain stable for d/c without neuro deficits. Will treat with Flexeril for muscle spasm and patient to f/u with PCP. Return precautions discussed.   Final diagnoses:  Acute exacerbation of chronic low back pain    New Prescriptions Discharge Medication List as of 01/03/2017  1:50 AM    START taking these medications   Details  cyclobenzaprine (FLEXERIL) 10 MG tablet Take 1 tablet (10 mg total) by mouth 2 (two) times daily as needed for muscle spasms., Starting Sat 01/03/2017, Print       I personally performed the services described in this documentation, which was scribed in my presence. The recorded information has been reviewed and is accurate.     Kerrie Buffaloeese, Hope Oak RidgeM, [place]NP 01/04/17 1613    Laurence SpatesLittle, Rachel Morgan, MD 01/08/17 [phone]

## 2017-01-03 MED ORDER — OXYCODONE-ACETAMINOPHEN 5-325 MG PO TABS
1.0000 | ORAL_TABLET | Freq: Once | ORAL | Status: AC
  Administered 2017-01-03: 1 via ORAL
  Filled 2017-01-03: qty 1

## 2017-01-03 MED ORDER — CYCLOBENZAPRINE HCL 10 MG PO TABS
10.0000 mg | ORAL_TABLET | Freq: Two times a day (BID) | ORAL | 0 refills | Status: DC | PRN
Start: 2017-01-03 — End: 2017-01-26

## 2017-01-03 NOTE — Discharge Instructions (Signed)
Follow up with your doctor or call and schedule an appointment with Dr. Roda ShuttersXu if symptoms persist.

## 2017-01-03 NOTE — ED Notes (Addendum)
Entered room to administer medication to patient and she was difficult to awaken for a second time.

## 2017-01-06 MED FILL — XARELTO 20 MG TABLET: 20 | 30 days supply | Qty: 30 | Fill #1

## 2017-01-09 ENCOUNTER — Encounter: Payer: Self-pay | Admitting: Vascular Surgery

## 2017-01-16 ENCOUNTER — Encounter (HOSPITAL_COMMUNITY): Payer: Self-pay

## 2017-01-16 ENCOUNTER — Telehealth (INDEPENDENT_AMBULATORY_CARE_PROVIDER_SITE_OTHER): Payer: Self-pay | Admitting: Physician Assistant

## 2017-01-16 ENCOUNTER — Encounter: Payer: Self-pay | Admitting: Vascular Surgery

## 2017-01-16 NOTE — Telephone Encounter (Signed)
Patient called stated would like to talk to Sindy Messingoger Gomez PA having severe leg pain and back pain.  Has an appt with vascular doctor but is not going can't get out of of bed.  Per patient doesn't want to go to ER because last time she went she waited from 7 pm and was out at 2 am and didn't feel any better  Please follow up with patient

## 2017-01-16 NOTE — Telephone Encounter (Signed)
Come in for evaluation next week. Go to ED if pain is severe or worrisome.

## 2017-01-16 NOTE — Telephone Encounter (Signed)
Pt is requesting that PCP order a MRI because the Xray is not going deep enough to find out the source of her pain. Christina [name], CMA

## 2017-01-22 NOTE — Telephone Encounter (Signed)
Left message explaining to patient to call and schedule an appointment or go to nearest ED if pain becomes more severe. Maryjean Mornempestt S Margi Edmundson, CMA

## 2017-01-26 ENCOUNTER — Encounter (INDEPENDENT_AMBULATORY_CARE_PROVIDER_SITE_OTHER): Payer: Self-pay | Admitting: Physician Assistant

## 2017-01-26 ENCOUNTER — Ambulatory Visit (INDEPENDENT_AMBULATORY_CARE_PROVIDER_SITE_OTHER): Payer: Self-pay | Admitting: Physician Assistant

## 2017-01-26 VITALS — BP 109/84 | HR 114 | Temp 98.0°F | Wt 146.2 lb

## 2017-01-26 DIAGNOSIS — M5442 Lumbago with sciatica, left side: Secondary | ICD-10-CM

## 2017-01-26 DIAGNOSIS — R252 Cramp and spasm: Secondary | ICD-10-CM

## 2017-01-26 DIAGNOSIS — M5441 Lumbago with sciatica, right side: Secondary | ICD-10-CM

## 2017-01-26 DIAGNOSIS — G8929 Other chronic pain: Secondary | ICD-10-CM

## 2017-01-26 DIAGNOSIS — F418 Other specified anxiety disorders: Secondary | ICD-10-CM

## 2017-01-26 MED ORDER — ACETAMINOPHEN-CODEINE #3 300-30 MG PO TABS: 1.0000 | ORAL_TABLET | ORAL | 0 refills | Status: AC | PRN

## 2017-01-26 MED ORDER — METHOCARBAMOL 500 MG PO TABS: 500.0000 mg | ORAL_TABLET | Freq: Three times a day (TID) | ORAL | 1 refills | Status: DC

## 2017-01-26 NOTE — Patient Instructions (Signed)
Sciatica Sciatica is pain, numbness, weakness, or tingling along the path of the sciatic nerve. The sciatic nerve starts in the lower back and runs down the back of each leg. The nerve controls the muscles in the lower leg and in the back of the knee. It also provides feeling (sensation) to the back of the thigh, the lower leg, and the sole of the foot. Sciatica is a symptom of another medical condition that pinches or puts pressure on the sciatic nerve. Generally, sciatica only affects one side of the body. Sciatica usually goes away on its own or with treatment. In some cases, sciatica may keep coming back (recur). What are the causes? This condition is caused by pressure on the sciatic nerve, or pinching of the sciatic nerve. This may be the result of:  A disk in between the bones of the spine (vertebrae) bulging out too far (herniated disk).  Age-related changes in the spinal disks (degenerative disk disease).  A pain disorder that affects a muscle in the buttock (piriformis syndrome).  Extra bone growth (bone spur) near the sciatic nerve.  An injury or break (fracture) of the pelvis.  Pregnancy.  Tumor (rare). What increases the risk? The following factors may make you more likely to develop this condition:  Playing sports that place pressure or stress on the spine, such as football or weight lifting.  Having poor strength and flexibility.  A history of back injury.  A history of back surgery.  Sitting for long periods of time.  Doing activities that involve repetitive bending or lifting.  Obesity. What are the signs or symptoms? Symptoms can vary from mild to very severe, and they may include:  Any of these problems in the lower back, leg, hip, or buttock:  Mild tingling or dull aches.  Burning sensations.  Sharp pains.  Numbness in the back of the calf or the sole of the foot.  Leg weakness.  Severe back pain that makes movement difficult. These symptoms may  get worse when you cough, sneeze, or laugh, or when you sit or stand for long periods of time. Being overweight may also make symptoms worse. In some cases, symptoms may recur over time. How is this diagnosed? This condition may be diagnosed based on:  Your symptoms.  A physical exam. Your health care provider may ask you to do certain movements to check whether those movements trigger your symptoms.  You may have tests, including:  Blood tests.  X-rays.  MRI.  CT scan. How is this treated? In many cases, this condition improves on its own, without any treatment. However, treatment may include:  Reducing or modifying physical activity during periods of pain.  Exercising and stretching to strengthen your abdomen and improve the flexibility of your spine.  Icing and applying heat to the affected area.  Medicines that help:  To relieve pain and swelling.  To relax your muscles.  Injections of medicines that help to relieve pain, irritation, and inflammation around the sciatic nerve (steroids).  Surgery. Follow these instructions at home: Medicines   Take over-the-counter and prescription medicines only as told by your health care provider.  Do not drive or operate heavy machinery while taking prescription pain medicine. Managing pain   If directed, apply ice to the affected area.  Put ice in a plastic bag.  Place a towel between your skin and the bag.  Leave the ice on for 20 minutes, 2-3 times a day.  After icing, apply heat to the   affected area before you exercise or as often as told by your health care provider. Use the heat source that your health care provider recommends, such as a moist heat pack or a heating pad.  Place a towel between your skin and the heat source.  Leave the heat on for 20-30 minutes.  Remove the heat if your skin turns bright red. This is especially important if you are unable to feel pain, heat, or cold. You may have a greater risk of  getting burned. Activity   Return to your normal activities as told by your health care provider. Ask your health care provider what activities are safe for you.  Avoid activities that make your symptoms worse.  Take brief periods of rest throughout the day. Resting in a lying or standing position is usually better than sitting to rest.  When you rest for longer periods, mix in some mild activity or stretching between periods of rest. This will help to prevent stiffness and pain.  Avoid sitting for long periods of time without moving. Get up and move around at least one time each hour.  Exercise and stretch regularly, as told by your health care provider.  Do not lift anything that is heavier than 10 lb (4.5 kg) while you have symptoms of sciatica. When you do not have symptoms, you should still avoid heavy lifting, especially repetitive heavy lifting.  When you lift objects, always use proper lifting technique, which includes:  Bending your knees.  Keeping the load close to your body.  Avoiding twisting. General instructions   Use good posture.  Avoid leaning forward while sitting.  Avoid hunching over while standing.  Maintain a healthy weight. Excess weight puts extra stress on your back and makes it difficult to maintain good posture.  Wear supportive, comfortable shoes. Avoid wearing high heels.  Avoid sleeping on a mattress that is too soft or too hard. A mattress that is firm enough to support your back when you sleep may help to reduce your pain.  Keep all follow-up visits as told by your health care provider. This is important. Contact a health care provider if:  You have pain that wakes you up when you are sleeping.  You have pain that gets worse when you lie down.  Your pain is worse than you have experienced in the past.  Your pain lasts longer than 4 weeks.  You experience unexplained weight loss. Get help right away if:  You lose control of your bowel  or bladder (incontinence).  You have:  Weakness in your lower back, pelvis, buttocks, or legs that gets worse.  Redness or swelling of your back.  A burning sensation when you urinate. This information is not intended to replace advice given to you by your health care provider. Make sure you discuss any questions you have with your health care provider. Document Released: 08/05/2001 Document Revised: 01/15/2016 Document Reviewed: 04/20/2015 Elsevier Interactive Patient Education  2017 Elsevier Inc.  

## 2017-01-26 NOTE — Progress Notes (Signed)
Subjective:  Patient ID: Christina [name], female    DOB: 05-Sep-1970  Age: 46 y.o. MRN: [id number]  CC: f/u anxiety and pain   HPI Christina [name] is a 46 y.o. female with a PMH of anxiety, depression, and chronic sciatica presents on f/u of back pain and mood. Patient feels the same in regards to depression and anxiety despite the Zoloft increase to 100mg . She has the issue of taking care of her elderly grandmother. Does not have help from other family members. Has received psychological help from different sources including [facility] and clinicians. Has not been able to obtain [facility] Card due to waiting for Medicaid decision. Says chronic lower back pain prevent her from conducting simple tasks at home. Can not work due to back pain. She was previously laid off due to incapacity since she was not able to perform her duties. Does not endorse any other symptoms or complaints.     Outpatient Medications Prior to Visit  Medication Sig Dispense Refill  . acetaminophen (TYLENOL) 500 MG tablet Take 500 mg by mouth every 6 (six) hours as needed for mild pain or moderate pain.    . cetirizine (ZYRTEC) 10 MG tablet Take 10 mg by mouth daily as needed for allergies.    Marland Kitchen gabapentin (NEURONTIN) 300 MG capsule Take 1 capsule (300 mg total) by mouth 3 (three) times daily. 90 capsule 3  . hydrochlorothiazide (HYDRODIURIL) 12.5 MG tablet Take 1 tablet (12.5 mg total) by mouth daily. 90 tablet 1  . metroNIDAZOLE (FLAGYL) 500 MG tablet Take 1 tablet (500 mg total) by mouth 2 (two) times daily. 28 tablet 0  . rivaroxaban (XARELTO) 20 MG TABS tablet Take 1 tablet (20 mg total) by mouth daily with supper. 90 tablet 3  . Rivaroxaban 15 & 20 MG TBPK Take as directed on package: Start with one 15mg  tablet by mouth twice a day with food. On Day 22, switch to one 20mg  tablet once a day with food. 51 each 0  . sertraline (ZOLOFT) 100 MG tablet Take 1 tablet (100 mg total) by mouth daily. 30 tablet 3  . cyclobenzaprine  (FLEXERIL) 10 MG tablet Take 1 tablet (10 mg total) by mouth 2 (two) times daily as needed for muscle spasms. 20 tablet 0   No facility-administered medications prior to visit.      ROS Review of Systems  Constitutional: Positive for malaise/fatigue. Negative for chills and fever.  Eyes: Negative for blurred vision.  Respiratory: Negative for shortness of breath.   Cardiovascular: Negative for chest pain and palpitations.  Gastrointestinal: Negative for abdominal pain and nausea.  Genitourinary: Negative for dysuria and hematuria.  Musculoskeletal: Positive for back pain. Negative for joint pain and myalgias.  Skin: Negative for rash.  Neurological: Positive for tingling. Negative for headaches.       Numbness of the RLE sometimes  Psychiatric/Behavioral: Positive for depression. The patient is nervous/anxious and has insomnia.     Objective:  BP 109/84   Pulse (!) 114   Temp 98 F (36.7 C) (Oral)   Wt 146 lb 3.2 oz (66.3 kg)   LMP 12/29/2016 (Exact Date)   SpO2 96%   BMI 23.96 kg/m   BP/Weight 01/26/2017 01/03/2017 01/02/2017  Systolic BP 109 130 -  Diastolic BP 84 87 -  Wt. (Lbs) 146.2 - 143  BMI 23.96 - 23.43      Physical Exam  Constitutional: She is oriented to person, place, and time.  Well developed, well nourished, in discomfort due to  back pain, polite  HENT:  Head: Normocephalic and atraumatic.  Eyes: No scleral icterus.  Neck: Normal range of motion.  Cardiovascular: Normal rate, regular rhythm and normal heart sounds.   Pulmonary/Chest: Effort normal and breath sounds normal.  Musculoskeletal: She exhibits tenderness (TTP of posterior RLE). She exhibits no edema.  TTP of the L4-L5 vertebrae  Neurological: She is alert and oriented to person, place, and time. No cranial nerve deficit. She exhibits normal muscle tone. Coordination normal.  Seated SLR positive on right side  Skin: Skin is warm and dry. No rash noted. No erythema. No pallor.  Psychiatric:  Her behavior is normal. Thought content normal.  Depressed, frustrated  Vitals reviewed.    Assessment & Plan:   1. Chronic bilateral low back pain with bilateral sciatica - Begin Tylenol #3 q4hrs prn #42 refill zero  2. Muscle cramping - Begin Robaxin 500mg  TID #90 refill one  Meds ordered this encounter  Medications  . acetaminophen-codeine (TYLENOL #3) 300-30 MG tablet    Sig: Take 1 tablet by mouth every 4 (four) hours as needed for moderate pain.    Dispense:  42 tablet    Refill:  0    Order Specific Question:   Supervising Provider    Answer:   Quentin AngstJEGEDE, OLUGBEMIGA E [id number]  . methocarbamol (ROBAXIN) 500 MG tablet    Sig: Take 1 tablet (500 mg total) by mouth 3 (three) times daily.    Dispense:  90 tablet    Refill:  1    Order Specific Question:   Supervising Provider    Answer:   Quentin AngstJEGEDE, OLUGBEMIGA E [id number]    Follow-up: Return in about 8 weeks (around 03/23/2017).   Loletta Specteroger David Gomez PA

## 2017-02-03 MED FILL — XARELTO 20 MG TABLET: 20 | 30 days supply | Qty: 30 | Fill #2

## 2017-02-12 ENCOUNTER — Encounter: Payer: Self-pay | Admitting: Vascular Surgery

## 2017-02-23 ENCOUNTER — Ambulatory Visit: Payer: Self-pay | Attending: Internal Medicine

## 2017-02-23 ENCOUNTER — Encounter: Payer: Self-pay | Admitting: Vascular Surgery

## 2017-02-24 ENCOUNTER — Ambulatory Visit (HOSPITAL_COMMUNITY)
Admission: RE | Admit: 2017-02-24 | Discharge: 2017-02-24 | Disposition: A | Discharge status: Home / Self Care | Payer: Self-pay | Source: Ambulatory Visit | Attending: Vascular Surgery | Admitting: Vascular Surgery

## 2017-02-24 DIAGNOSIS — I878 Other specified disorders of veins: Secondary | ICD-10-CM | POA: Insufficient documentation

## 2017-02-24 DIAGNOSIS — I82402 Acute embolism and thrombosis of unspecified deep veins of left lower extremity: Secondary | ICD-10-CM

## 2017-02-27 ENCOUNTER — Encounter: Payer: Self-pay | Admitting: Vascular Surgery

## 2017-03-04 ENCOUNTER — Encounter (INDEPENDENT_AMBULATORY_CARE_PROVIDER_SITE_OTHER): Payer: Self-pay | Admitting: Physician Assistant

## 2017-03-04 ENCOUNTER — Ambulatory Visit (INDEPENDENT_AMBULATORY_CARE_PROVIDER_SITE_OTHER): Payer: Self-pay | Admitting: Physician Assistant

## 2017-03-04 VITALS — BP 145/101 | HR 119 | Temp 100.1°F | Wt 145.6 lb

## 2017-03-04 DIAGNOSIS — I82412 Acute embolism and thrombosis of left femoral vein: Secondary | ICD-10-CM

## 2017-03-04 DIAGNOSIS — G4700 Insomnia, unspecified: Secondary | ICD-10-CM

## 2017-03-04 DIAGNOSIS — G8929 Other chronic pain: Secondary | ICD-10-CM

## 2017-03-04 DIAGNOSIS — M5442 Lumbago with sciatica, left side: Secondary | ICD-10-CM

## 2017-03-04 DIAGNOSIS — F418 Other specified anxiety disorders: Secondary | ICD-10-CM

## 2017-03-04 DIAGNOSIS — M5441 Lumbago with sciatica, right side: Secondary | ICD-10-CM

## 2017-03-04 DIAGNOSIS — Z23 Encounter for immunization: Secondary | ICD-10-CM

## 2017-03-04 DIAGNOSIS — I10 Essential (primary) hypertension: Secondary | ICD-10-CM

## 2017-03-04 MED ORDER — SERTRALINE HCL 100 MG PO TABS: 100.0000 mg | ORAL_TABLET | Freq: Every day | ORAL | 3 refills | Status: DC

## 2017-03-04 MED ORDER — CLONAZEPAM 0.5 MG PO TABS: 0.5000 mg | ORAL_TABLET | Freq: Two times a day (BID) | ORAL | 0 refills | Status: DC | PRN

## 2017-03-04 MED ORDER — ESCITALOPRAM OXALATE 20 MG PO TABS: 20.0000 mg | ORAL_TABLET | Freq: Every day | ORAL | 2 refills | Status: DC

## 2017-03-04 MED ORDER — AMLODIPINE BESYLATE 5 MG PO TABS: 5.0000 mg | ORAL_TABLET | Freq: Every day | ORAL | 1 refills | Status: DC

## 2017-03-04 MED FILL — XARELTO 20 MG TABLET: 20 | 30 days supply | Qty: 30 | Fill #3
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
## 2017-03-04 NOTE — Patient Instructions (Signed)
Insomnia Insomnia is a sleep disorder that makes it difficult to fall asleep or to stay asleep. Insomnia can cause tiredness (fatigue), low energy, difficulty concentrating, mood swings, and poor performance at work or school. There are three different ways to classify insomnia:  Difficulty falling asleep.  Difficulty staying asleep.  Waking up too early in the morning.  Any type of insomnia can be long-term (chronic) or short-term (acute). Both are common. Short-term insomnia usually lasts for three months or less. Chronic insomnia occurs at least three times a week for longer than three months. What are the causes? Insomnia may be caused by another condition, situation, or substance, such as:  Anxiety.  Certain medicines.  Gastroesophageal reflux disease (GERD) or other gastrointestinal conditions.  Asthma or other breathing conditions.  Restless legs syndrome, sleep apnea, or other sleep disorders.  Chronic pain.  Menopause. This may include hot flashes.  Stroke.  Abuse of alcohol, tobacco, or illegal drugs.  Depression.  Caffeine.  Neurological disorders, such as Alzheimer disease.  An overactive thyroid (hyperthyroidism).  The cause of insomnia may not be known. What increases the risk? Risk factors for insomnia include:  Gender. Women are more commonly affected than men.  Age. Insomnia is more common as you get older.  Stress. This may involve your professional or personal life.  Income. Insomnia is more common in people with lower income.  Lack of exercise.  Irregular work schedule or night shifts.  Traveling between different time zones.  What are the signs or symptoms? If you have insomnia, trouble falling asleep or trouble staying asleep is the main symptom. This may lead to other symptoms, such as:  Feeling fatigued.  Feeling nervous about going to sleep.  Not feeling rested in the morning.  Having trouble concentrating.  Feeling  irritable, anxious, or depressed.  How is this treated? Treatment for insomnia depends on the cause. If your insomnia is caused by an underlying condition, treatment will focus on addressing the condition. Treatment may also include:  Medicines to help you sleep.  Counseling or therapy.  Lifestyle adjustments.  Follow these instructions at home:  Take medicines only as directed by your health care provider.  Keep regular sleeping and waking hours. Avoid naps.  Keep a sleep diary to help you and your health care provider figure out what could be causing your insomnia. Include: ? When you sleep. ? When you wake up during the night. ? How well you sleep. ? How rested you feel the next day. ? Any side effects of medicines you are taking. ? What you eat and drink.  Make your bedroom a comfortable place where it is easy to fall asleep: ? Put up shades or special blackout curtains to block light from outside. ? Use a white noise machine to block noise. ? Keep the temperature cool.  Exercise regularly as directed by your health care provider. Avoid exercising right before bedtime.  Use relaxation techniques to manage stress. Ask your health care provider to suggest some techniques that may work well for you. These may include: ? Breathing exercises. ? Routines to release muscle tension. ? Visualizing peaceful scenes.  Cut back on alcohol, caffeinated beverages, and cigarettes, especially close to bedtime. These can disrupt your sleep.  Do not overeat or eat spicy foods right before bedtime. This can lead to digestive discomfort that can make it hard for you to sleep.  Limit screen use before bedtime. This includes: ? Watching TV. ? Using your smartphone, tablet, and   computer.  Stick to a routine. This can help you fall asleep faster. Try to do a quiet activity, brush your teeth, and go to bed at the same time each night.  Get out of bed if you are still awake after 15 minutes  of trying to sleep. Keep the lights down, but try reading or doing a quiet activity. When you feel sleepy, go back to bed.  Make sure that you drive carefully. Avoid driving if you feel very sleepy.  Keep all follow-up appointments as directed by your health care provider. This is important. Contact a health care provider if:  You are tired throughout the day or have trouble in your daily routine due to sleepiness.  You continue to have sleep problems or your sleep problems get worse. Get help right away if:  You have serious thoughts about hurting yourself or someone else. This information is not intended to replace advice given to you by your health care provider. Make sure you discuss any questions you have with your health care provider. Document Released: 08/08/2000 Document Revised: 01/11/2016 Document Reviewed: 05/12/2014 Elsevier Interactive Patient Education  2018 [employer]   Wal[employer] Resources  Advocacy/Legal Legal Aid [place]:  [phone]  /  [phone]  Family Justice Center:  [phone]  Family Service of the [facility] 24-hr Crisis line:  ([phone]  [facility], [place]:  ([phone]  Court Watch (custody):  [phone]  [employer] Humanitarian Law Clinic:   [phone]    Baby & Breastfeeding Car Seat Inspection @ Various [place] Equities traderire Depts.- call [phone]  [facility] Lactation  [phone]  [facility] Lactation [phone]  WIC: [phone] ([place]);  [phone] ([place])  Pro[place]cal[place] Leche League:  [phone]   Childcare [place] Child Development: [phone] [facility]([place]) / [phone] ([place])  - Child Care Resources/ Referrals/ Scholarships  - Head Start/ Early Head Start (call or apply online)  [place] DHHS: [place] Pre-K :  [phone] / [phone]   Employment / Job Search [facility] of [place]: [phone] / [address]  [employer] (JobLink): [phone] ([place]) / [phone] ([place])  Triad Engineer, maintenance (IT)[employer] Community  Resource/ Career Center: [phone] / [phone]  [facility] Job & Career Center: [phone]  DHHS Work First: [phone] ([place]) / [phone] ([place])  StepUp Ministry [place]:  [phone]   Financial Assistance [place] [facility] Ministry:  [phone]  Salvation Army: [phone] [phone]  Dominica SeverinBarnabas Network (furniture):  [phone] [phone]  [facility] Helping Hands: [phone]([phone]  Low Income Energy Assistance  ([phone]   Food Assistance DHHS- SNAP/ Food Stamps: 630-630-2215(801)853-8052  WIC: Manley [phone]- [phone] ;  [place] [phone]  Layne BentonLittle Green Book- Free Meals  Little Blue Book- Free Food Pantries  During the summer, text "FOOD" to [phone]   General Health / Clinics (Adults) Orange Card (for Adults) through [facility]: [phone]  [facility]:   [phone]  [facility] Community Health & Wellness:   [phone]  Health Department:  [phone]  [facility] Kuss[facility] [place] Community Health:  [phone] / [phone]  Planned Parenthood of [place]:   [phone]  [facility]:   [phone]   Housing [place] Housing Coalition:   [phone]  [facility] Authority:  [phone]  Affordable Housing Managemnt:  [phone] [phone]   Immigrant/ Refugee Center for [facility] [place] [place](UNCG):  [phone]  Faith Action International House:  [phone]  [employer] Institute:  ([phone]  Parks RangerChurch World Services:  [phone]  African Services Coalition:  [phone]   LGBTQ YouthSAFE  www.youthsafegso.org  PFLAG  (716)376-6540902-126-8065 / info@[place] .org  [place] Project:  (802) 261-7206   Mental Health/ Substance Use Family Service of the [facility]  [phone]  [facility] Behavioral Health:  (614)091-0839 or 1-470-046-1150  [facility] of Care:  [phone]  Journeys Counseling:  [phone]  [facility] Care Services:  (701) [phone]  Vesta Mixer (walk-ins)  7823198538 / [address]  [place]:  (579)[phone]  Alcoholics  Anonymous:  [phone]  Narcotics Anonymous:  (531)290-0318  Quit Smoking Hotline:  800-QUIT-NOW (681)143-5764)   Parenting Children's Home Society:  210-629-6980  [facility] Health: Education Center & Support Groups:  873-009-0750  YWCA: 385-047-9631  UNCG: Bringing Out the Best:  (803) [phone]               Thriving at Three (Hispanic families): (330)022-5922  Healthy Start (Family Service of the [place]):  (667)810-3873 [phone]  Parents as Teachers:  [phone]  [place] Child Development- Learning Together (Immigrants): 609-045-8778   Poison Control 305-320-0528  Sports & Recreation [employer] Open Doors Application: https://www.rich.com/  [place] of [place] Recreation Centers: http://www.[place].gov/index.aspx?page=3615   Special Needs Family Support Network:  530-564-0149  Autism Society of [place]:   562-526-0214 7261026151 or 901-460-5126 /  [phone]  [facility]:  (934) [phone]  ARC of [place]:  (339)281-9726  Children's Developmental Service Agency (CDSA):  231-224-3356  [facility] (Care Coordination for Children):  502-531-3835   Transportation Medicaid Transportation: 980-287-8417 to apply  Dallie Piles Authority: 303 801 7558 (reduced-fare bus ID to Medicaid/ Medicare/ Orange Card)  SCAT Paratransit services: Eligible riders only, call 804-478-3356 for application   Tutoring/Mentoring Black Child Development Institute: 803-876-9178  [facility] Brothers/ Big Sisters: [phone] ([place])  ACES through child's school: [phone]  [employer] Achievers: contact your local Y  SHIELD Mentor Program: [phone]

## 2017-03-05 MED FILL — AMLODIPINE BESYLATE 5 MG TA: 5 | 30 days supply | Qty: 30 | Fill #0

## 2017-03-05 MED FILL — ?ESCITALOPRAM 20 MG TABLET: 20 | 30 days supply | Qty: 30 | Fill #0

## 2017-03-05 MED FILL — SERTRALINE HCL 100 MG TAB: 100 | 30 days supply | Qty: 30 | Fill #0

## 2017-03-09 MED FILL — METHOCARBAMOL 500 MG TABLET: 500 | 11 days supply | Qty: 35 | Fill #0

## 2017-03-09 MED FILL — GABAPENTIN 300 MG CAPSULE: 300 | 30 days supply | Qty: 90 | Fill #0

## 2017-03-19 ENCOUNTER — Telehealth (INDEPENDENT_AMBULATORY_CARE_PROVIDER_SITE_OTHER): Payer: Self-pay | Admitting: Physician Assistant

## 2017-03-19 ENCOUNTER — Other Ambulatory Visit (INDEPENDENT_AMBULATORY_CARE_PROVIDER_SITE_OTHER): Payer: Self-pay | Admitting: Physician Assistant

## 2017-03-19 DIAGNOSIS — G8929 Other chronic pain: Secondary | ICD-10-CM

## 2017-03-19 DIAGNOSIS — M5442 Lumbago with sciatica, left side: Principal | ICD-10-CM

## 2017-03-19 DIAGNOSIS — M5441 Lumbago with sciatica, right side: Principal | ICD-10-CM

## 2017-03-19 NOTE — Telephone Encounter (Signed)
Patient called stated did get [facility]. And would like for MRI to be scheduled.  Please follow up with patient.

## 2017-03-19 NOTE — Progress Notes (Signed)
Chronic bilateral low back pain with bilateral sciatica  MR L spine with and without

## 2017-03-19 NOTE — Telephone Encounter (Signed)
FWD to PCP. Tempestt S Roberts, CMA  

## 2017-03-24 MED FILL — METHOCARBAMOL 500 MG TABLET: 500 | 6 days supply | Qty: 20 | Fill #1

## 2017-03-27 ENCOUNTER — Encounter (INDEPENDENT_AMBULATORY_CARE_PROVIDER_SITE_OTHER): Payer: Self-pay | Admitting: Physician Assistant

## 2017-03-27 ENCOUNTER — Ambulatory Visit (INDEPENDENT_AMBULATORY_CARE_PROVIDER_SITE_OTHER): Payer: Self-pay | Admitting: Physician Assistant

## 2017-03-27 VITALS — BP 136/82 | HR 129 | Temp 98.7°F | Wt 150.2 lb

## 2017-03-27 DIAGNOSIS — I10 Essential (primary) hypertension: Secondary | ICD-10-CM

## 2017-03-27 DIAGNOSIS — R000 Tachycardia, unspecified: Secondary | ICD-10-CM

## 2017-03-27 DIAGNOSIS — F418 Other specified anxiety disorders: Secondary | ICD-10-CM

## 2017-03-27 DIAGNOSIS — G4700 Insomnia, unspecified: Secondary | ICD-10-CM

## 2017-03-27 DIAGNOSIS — M5441 Lumbago with sciatica, right side: Secondary | ICD-10-CM

## 2017-03-27 DIAGNOSIS — M5442 Lumbago with sciatica, left side: Secondary | ICD-10-CM

## 2017-03-27 DIAGNOSIS — I82412 Acute embolism and thrombosis of left femoral vein: Secondary | ICD-10-CM

## 2017-03-27 DIAGNOSIS — G8929 Other chronic pain: Secondary | ICD-10-CM

## 2017-03-27 MED ORDER — ESCITALOPRAM OXALATE 20 MG PO TABS: 20.0000 mg | ORAL_TABLET | Freq: Every day | ORAL | 2 refills | Status: DC

## 2017-03-27 MED ORDER — GABAPENTIN 300 MG PO CAPS: 300.0000 mg | ORAL_CAPSULE | Freq: Three times a day (TID) | ORAL | 3 refills | Status: DC

## 2017-03-27 MED ORDER — RIVAROXABAN 20 MG PO TABS: 20.0000 mg | ORAL_TABLET | Freq: Every day | ORAL | 3 refills | Status: DC

## 2017-03-27 MED ORDER — GABAPENTIN 300 MG PO CAPS: 600.0000 mg | ORAL_CAPSULE | Freq: Three times a day (TID) | ORAL | 1 refills | Status: DC

## 2017-03-27 MED ORDER — AMLODIPINE BESYLATE 5 MG PO TABS: 5.0000 mg | ORAL_TABLET | Freq: Every day | ORAL | 1 refills | Status: DC

## 2017-03-27 MED ORDER — TRAZODONE HCL 50 MG PO TABS: 50.0000 mg | ORAL_TABLET | Freq: Every day | ORAL | 2 refills | Status: DC

## 2017-03-27 MED ORDER — CARVEDILOL 12.5 MG PO TABS: 12.5000 mg | ORAL_TABLET | Freq: Two times a day (BID) | ORAL | 1 refills | Status: DC

## 2017-03-27 MED FILL — CARVEDILOL 12.5 MG TABLET: 12.5 | 45 days supply | Qty: 90 | Fill #0

## 2017-03-27 MED FILL — traZODone HCL 50 MG TABS: 50 | 30 days supply | Qty: 30 | Fill #0

## 2017-03-27 NOTE — Patient Instructions (Addendum)
Insomnia Insomnia is a sleep disorder that makes it difficult to fall asleep or to stay asleep. Insomnia can cause tiredness (fatigue), low energy, difficulty concentrating, mood swings, and poor performance at work or school. There are three different ways to classify insomnia:  Difficulty falling asleep.  Difficulty staying asleep.  Waking up too early in the morning.  Any type of insomnia can be long-term (chronic) or short-term (acute). Both are common. Short-term insomnia usually lasts for three months or less. Chronic insomnia occurs at least three times a week for longer than three months. What are the causes? Insomnia may be caused by another condition, situation, or substance, such as:  Anxiety.  Certain medicines.  Gastroesophageal reflux disease (GERD) or other gastrointestinal conditions.  Asthma or other breathing conditions.  Restless legs syndrome, sleep apnea, or other sleep disorders.  Chronic pain.  Menopause. This may include hot flashes.  Stroke.  Abuse of alcohol, tobacco, or illegal drugs.  Depression.  Caffeine.  Neurological disorders, such as Alzheimer disease.  An overactive thyroid (hyperthyroidism).  The cause of insomnia may not be known. What increases the risk? Risk factors for insomnia include:  Gender. Women are more commonly affected than men.  Age. Insomnia is more common as you get older.  Stress. This may involve your professional or personal life.  Income. Insomnia is more common in people with lower income.  Lack of exercise.  Irregular work schedule or night shifts.  Traveling between different time zones.  What are the signs or symptoms? If you have insomnia, trouble falling asleep or trouble staying asleep is the main symptom. This may lead to other symptoms, such as:  Feeling fatigued.  Feeling nervous about going to sleep.  Not feeling rested in the morning.  Having trouble concentrating.  Feeling  irritable, anxious, or depressed.  How is this treated? Treatment for insomnia depends on the cause. If your insomnia is caused by an underlying condition, treatment will focus on addressing the condition. Treatment may also include:  Medicines to help you sleep.  Counseling or therapy.  Lifestyle adjustments.  Follow these instructions at home:  Take medicines only as directed by your health care provider.  Keep regular sleeping and waking hours. Avoid naps.  Keep a sleep diary to help you and your health care provider figure out what could be causing your insomnia. Include: ? When you sleep. ? When you wake up during the night. ? How well you sleep. ? How rested you feel the next day. ? Any side effects of medicines you are taking. ? What you eat and drink.  Make your bedroom a comfortable place where it is easy to fall asleep: ? Put up shades or special blackout curtains to block light from outside. ? Use a white noise machine to block noise. ? Keep the temperature cool.  Exercise regularly as directed by your health care provider. Avoid exercising right before bedtime.  Use relaxation techniques to manage stress. Ask your health care provider to suggest some techniques that may work well for you. These may include: ? Breathing exercises. ? Routines to release muscle tension. ? Visualizing peaceful scenes.  Cut back on alcohol, caffeinated beverages, and cigarettes, especially close to bedtime. These can disrupt your sleep.  Do not overeat or eat spicy foods right before bedtime. This can lead to digestive discomfort that can make it hard for you to sleep.  Limit screen use before bedtime. This includes: ? Watching TV. ? Using your smartphone, tablet, and   computer.  Stick to a routine. This can help you fall asleep faster. Try to do a quiet activity, brush your teeth, and go to bed at the same time each night.  Get out of bed if you are still awake after 15 minutes  of trying to sleep. Keep the lights down, but try reading or doing a quiet activity. When you feel sleepy, go back to bed.  Make sure that you drive carefully. Avoid driving if you feel very sleepy.  Keep all follow-up appointments as directed by your health care provider. This is important. Contact a health care provider if:  You are tired throughout the day or have trouble in your daily routine due to sleepiness.  You continue to have sleep problems or your sleep problems get worse. Get help right away if:  You have serious thoughts about hurting yourself or someone else. This information is not intended to replace advice given to you by your health care provider. Make sure you discuss any questions you have with your health care provider. Document Released: 08/08/2000 Document Revised: 01/11/2016 Document Reviewed: 05/12/2014 Elsevier Interactive Patient Education  2018 [employer]  Sinus Tachycardia Sinus tachycardia is a kind of fast heartbeat. In sinus tachycardia, the heart beats more than 100 times a minute. Sinus tachycardia starts in a part of the heart called the sinus node. Sinus tachycardia may be harmless, or it may be a sign of a serious condition. What are the causes? This condition may be caused by:  Exercise or exertion.  A fever.  Pain.  Loss of body fluids (dehydration).  Severe bleeding (hemorrhage).  Anxiety and stress.  Certain substances, including: ? Alcohol. ? Caffeine. ? Tobacco and nicotine products. ? Diet pills. ? Illegal drugs.  Medical conditions including: ? Heart disease. ? An infection. ? An overactive thyroid (hyperthyroidism). ? A lack of red blood cells (anemia).  What are the signs or symptoms? Symptoms of this condition include:  A feeling that the heart is beating quickly (palpitations).  Suddenly noticing your heartbeat (cardiac awareness).  Dizziness.  Tiredness (fatigue).  Shortness of breath.  Chest  pain.  Nausea.  Fainting.  How is this diagnosed? This condition is diagnosed with:  A physical exam.  Other tests, such as: ? Blood tests. ? An electrocardiogram (ECG). This test measures the electrical activity of the heart. ? Holter monitoring. For this test, you wear a device that records your heartbeat for one or more days.  You may be referred to a heart specialist (cardiologist). How is this treated? Treatment for this condition depends on the cause or underlying condition. Treatment may involve:  Treating the underlying condition.  Taking new medicines or changing your current medicines as told by your health care provider.  Making changes to your diet or lifestyle.  Practicing relaxation methods.  Follow these instructions at home: Lifestyle  Do not use any products that contain nicotine or tobacco, such as cigarettes and e-cigarettes. If you need help quitting, ask your health care provider.  Learn relaxation methods, like deep breathing, to help you when you get stressed or anxious.  Do not use illegal drugs, such as cocaine.  Do not abuse alcohol. Limit alcohol intake to no more than 1 drink a day for non-pregnant women and 2 drinks a day for men. One drink equals 12 oz of beer, 5 oz of wine, or 1 oz of hard liquor.  Find time to rest and relax often. This reduces stress.  Avoid: ? Caffeine. ?  Stimulants such as over-the-counter diet pills or pills that help you to stay awake. ? Situations that cause anxiety or stress. General instructions  Drink enough fluids to keep your urine clear or pale yellow.  Take over-the-counter and prescription medicines only as told by your health care provider.  Keep all follow-up visits as told by your health care provider. This is important. Contact a health care provider if:  You have a fever.  You have vomiting or diarrhea that keeps happening (is persistent). Get help right away if:  You have pain in your  chest, upper arms, jaw, or neck.  You become weak or dizzy.  You feel faint.  You have palpitations that do not go away. This information is not intended to replace advice given to you by your health care provider. Make sure you discuss any questions you have with your health care provider. Document Released: 09/18/2004 Document Revised: 03/08/2016 Document Reviewed: 02/23/2015 Elsevier Interactive Patient Education  [employer]2018 Else[employer] Inc.
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
## 2017-04-06 ENCOUNTER — Other Ambulatory Visit (INDEPENDENT_AMBULATORY_CARE_PROVIDER_SITE_OTHER): Payer: Self-pay | Admitting: Physician Assistant

## 2017-04-06 MED FILL — XARELTO 20 MG TABLET: 20 | 30 days supply | Qty: 30 | Fill #4

## 2017-04-06 MED FILL — GABAPENTIN 300 MG CAPSULE: 300 | 30 days supply | Qty: 90 | Fill #1

## 2017-04-06 MED FILL — SERTRALINE HCL 100 MG TAB: 100 | 30 days supply | Qty: 30 | Fill #1

## 2017-04-06 NOTE — Telephone Encounter (Signed)
FWD to PCP. Christina [name] S Michaela Shankel, CMA  

## 2017-04-07 MED FILL — METHOCARBAMOL 500 MG TABS: 500 | 18 days supply | Qty: 55 | Fill #0

## 2017-04-10 ENCOUNTER — Other Ambulatory Visit (INDEPENDENT_AMBULATORY_CARE_PROVIDER_SITE_OTHER): Payer: Self-pay | Admitting: Physician Assistant

## 2017-04-10 NOTE — Telephone Encounter (Signed)
FWD to PCP. Tempestt S Roberts, CMA  

## 2017-04-25 MED FILL — SERTRALINE HCL 100 MG TAB: 100 | 30 days supply | Qty: 30 | Fill #2

## 2017-04-25 MED FILL — traZODone HCL 50 MG TABS: 50 | 30 days supply | Qty: 30 | Fill #1

## 2017-05-07 MED FILL — XARELTO 20 MG TABLET: 20 | 30 days supply | Qty: 30 | Fill #5

## 2017-05-22 ENCOUNTER — Encounter (INDEPENDENT_AMBULATORY_CARE_PROVIDER_SITE_OTHER): Payer: Self-pay | Admitting: Physician Assistant

## 2017-05-22 ENCOUNTER — Ambulatory Visit (INDEPENDENT_AMBULATORY_CARE_PROVIDER_SITE_OTHER): Payer: Self-pay | Admitting: Physician Assistant

## 2017-05-22 VITALS — BP 145/89 | HR 76 | Temp 98.1°F | Wt 143.2 lb

## 2017-05-22 DIAGNOSIS — M5441 Lumbago with sciatica, right side: Secondary | ICD-10-CM

## 2017-05-22 DIAGNOSIS — I10 Essential (primary) hypertension: Secondary | ICD-10-CM

## 2017-05-22 DIAGNOSIS — F418 Other specified anxiety disorders: Secondary | ICD-10-CM

## 2017-05-22 DIAGNOSIS — G8929 Other chronic pain: Secondary | ICD-10-CM

## 2017-05-22 DIAGNOSIS — M5442 Lumbago with sciatica, left side: Secondary | ICD-10-CM

## 2017-05-22 DIAGNOSIS — R000 Tachycardia, unspecified: Secondary | ICD-10-CM | POA: Insufficient documentation

## 2017-05-22 DIAGNOSIS — G43001 Migraine without aura, not intractable, with status migrainosus: Secondary | ICD-10-CM | POA: Insufficient documentation

## 2017-05-22 MED ORDER — ONDANSETRON 4 MG PO TBDP: 4.0000 mg | ORAL_TABLET | Freq: Three times a day (TID) | ORAL | 0 refills | Status: DC | PRN

## 2017-05-22 MED ORDER — SUMATRIPTAN SUCCINATE 25 MG PO TABS: 25.0000 mg | ORAL_TABLET | Freq: Every day | ORAL | 1 refills | Status: DC

## 2017-05-22 MED ORDER — GABAPENTIN 300 MG PO CAPS: 600.0000 mg | ORAL_CAPSULE | Freq: Three times a day (TID) | ORAL | 1 refills | Status: DC

## 2017-05-22 MED ORDER — TOPIRAMATE 25 MG PO TABS: 25.0000 mg | ORAL_TABLET | Freq: Every day | ORAL | 2 refills | Status: DC

## 2017-05-22 MED ORDER — AMLODIPINE BESYLATE 10 MG PO TABS: 10.0000 mg | ORAL_TABLET | Freq: Every day | ORAL | 1 refills | Status: DC

## 2017-05-22 MED ORDER — ACETAMINOPHEN-CODEINE #3 300-30 MG PO TABS: 1.0000 | ORAL_TABLET | Freq: Three times a day (TID) | ORAL | 0 refills | Status: AC | PRN

## 2017-05-22 MED ORDER — ESCITALOPRAM OXALATE 20 MG PO TABS: 20.0000 mg | ORAL_TABLET | Freq: Every day | ORAL | 2 refills | Status: DC

## 2017-05-22 MED ORDER — CARVEDILOL 12.5 MG PO TABS: 12.5000 mg | ORAL_TABLET | Freq: Two times a day (BID) | ORAL | 1 refills | Status: DC

## 2017-05-22 MED FILL — AMLODIPINE BESYLATE 10 MG T: 10 | 30 days supply | Qty: 30 | Fill #0

## 2017-05-22 MED FILL — ?CARVEDILOL 12.5 MG TABLET: 12.5 | 45 days supply | Qty: 90 | Fill #1

## 2017-05-22 MED FILL — SUMATRIPTAN SUCC 25 MG TAB: 25 | 30 days supply | Qty: 9 | Fill #0

## 2017-05-22 MED FILL — ESCITALOPRAM 20 MG TABLET: 20 | 30 days supply | Qty: 30 | Fill #0

## 2017-05-22 MED FILL — ACETAMINOPHEN/COD #3 TABLET: 300-30 | 14 days supply | Qty: 42 | Fill #0

## 2017-05-22 MED FILL — ONDANSETRON ODT 4 MG TABLET: 4 | 7 days supply | Qty: 21 | Fill #0

## 2017-05-22 MED FILL — TOPIRAMATE 25 MG TABLET: 25 | 30 days supply | Qty: 30 | Fill #0

## 2017-05-22 MED FILL — GABAPENTIN 300 MG CAPSULE: 300 | 30 days supply | Qty: 180 | Fill #0

## 2017-05-22 NOTE — Progress Notes (Signed)
Pt complains of a lingering migraine for the past three days Nausea, sensitivity to light/sound, unable to eat, can only keep down sprite and ginger ale
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
## 2017-05-22 NOTE — Patient Instructions (Signed)

## 2017-05-27 ENCOUNTER — Emergency Department (HOSPITAL_COMMUNITY)
Admission: EM | Admit: 2017-05-27 | Discharge: 2017-05-27 | Disposition: A | Discharge status: Home / Self Care | Payer: Self-pay | Attending: Emergency Medicine | Admitting: Emergency Medicine

## 2017-05-27 ENCOUNTER — Encounter (HOSPITAL_COMMUNITY): Payer: Self-pay | Admitting: *Deleted

## 2017-05-27 DIAGNOSIS — M545 Low back pain, unspecified: Secondary | ICD-10-CM

## 2017-05-27 DIAGNOSIS — F17210 Nicotine dependence, cigarettes, uncomplicated: Secondary | ICD-10-CM | POA: Insufficient documentation

## 2017-05-27 DIAGNOSIS — Z79899 Other long term (current) drug therapy: Secondary | ICD-10-CM | POA: Insufficient documentation

## 2017-05-27 DIAGNOSIS — I10 Essential (primary) hypertension: Secondary | ICD-10-CM | POA: Insufficient documentation

## 2017-05-27 HISTORY — DX: Acute embolism and thrombosis of unspecified deep veins of unspecified lower extremity: I82.409

## 2017-05-27 MED ORDER — METHOCARBAMOL 500 MG PO TABS
500.0000 mg | ORAL_TABLET | Freq: Once | ORAL | Status: AC
  Administered 2017-05-27: 500 mg via ORAL
  Filled 2017-05-27: qty 1

## 2017-05-27 MED ORDER — PREDNISONE 20 MG PO TABS
60.0000 mg | ORAL_TABLET | ORAL | Status: AC
  Administered 2017-05-27: 60 mg via ORAL
  Filled 2017-05-27: qty 3

## 2017-05-27 MED ORDER — PREDNISONE 20 MG PO TABS: 40.0000 mg | ORAL_TABLET | Freq: Every day | ORAL | 0 refills | Status: DC

## 2017-05-27 MED ORDER — KETOROLAC TROMETHAMINE 30 MG/ML IJ SOLN
15.0000 mg | Freq: Once | INTRAMUSCULAR | Status: AC
  Administered 2017-05-27: 15 mg via INTRAMUSCULAR
  Filled 2017-05-27: qty 1

## 2017-05-27 MED ORDER — ONDANSETRON 8 MG PO TBDP
8.0000 mg | ORAL_TABLET | Freq: Once | ORAL | Status: AC
  Administered 2017-05-27: 8 mg via ORAL
  Filled 2017-05-27: qty 1

## 2017-05-27 MED ORDER — METHOCARBAMOL 500 MG PO TABS: 500.0000 mg | ORAL_TABLET | Freq: Three times a day (TID) | ORAL | 0 refills | Status: DC

## 2017-05-27 MED FILL — ?PREDNISONE 20MG TABLET: 20 | 4 days supply | Qty: 8 | Fill #0

## 2017-05-27 MED FILL — ESCITALOPRAM 20 MG TABLET: 20 | 30 days supply | Qty: 30 | Fill #0

## 2017-05-27 MED FILL — METHOCARBAMOL 500 MG TABLET: 500 | 6 days supply | Qty: 19 | Fill #0

## 2017-05-27 NOTE — ED Triage Notes (Signed)
Patient is alert and oriented x4.  She is from home and being seen for lower back pain.  Patient states that her chronic back pain has been getting worse and that she has a MRI scheduled for Friday but needs pain control till then.  Currently she rates her pain 10 of 10 .

## 2017-05-27 NOTE — ED Provider Notes (Signed)
[facility]-EMERGENCY DEPT Provider Note   CSN: [id number] Arrival date & time: 05/27/17  4098     History   Chief Complaint Chief Complaint  Patient presents with  . Back Pain    HPI Christina [name] is a 46 y.o. female.  HPI Patient returns with worsening low back pain, with radiation on the left leg. Patient has multiple medical issues, including DVT, chronic back pain. Patient states that she takes her medication regularly, including Xarelto, Tylenol 3. She recently ran out of her muscle relaxant. Over the past 3 or 4 days she has had increasing pain in her lower back in a typical distribution, with typical radiation on the left leg. She has baseline gait difficulty secondary to pain. Patient is working with a physician, and is scheduled for outpatient MRI in 3 days. She denies new fever, chills, abdominal pain, incontinence. Past Medical History:  Diagnosis Date  . Anxiety   . Back pain   . DVT (deep venous thrombosis) (HCC)    left leg with stent    Patient Active Problem List   Diagnosis Date Noted  . Hypertension 05/22/2017  . Migraine without aura and with status migrainosus, not intractable 05/22/2017  . Tachycardia 05/22/2017  . Insomnia 03/04/2017  . Chronic bilateral low back pain with bilateral sciatica 01/26/2017  . Depression with anxiety 01/26/2017  . DVT (deep venous thrombosis) (HCC) 11/03/2016    Past Surgical History:  Procedure Laterality Date  . INTRAVASCULAR ULTRASOUND/IVUS Left 11/03/2016   Procedure: Intravascular Ultrasound/IVUS;  Surgeon: Maeola Harman, MD;  Location: [facility] INVASIVE CV LAB;  Service: Cardiovascular;  Laterality: Left;  . LOWER EXTREMITY VENOGRAPHY Left 11/04/2016   Procedure: Lower Extremity Venography;  Surgeon: Nada Libman, MD;  Location: [facility] INVASIVE CV LAB;  Service: Cardiovascular;  Laterality: Left;  . PERIPHERAL VASCULAR INTERVENTION Left 11/04/2016   Procedure: Peripheral Vascular Intervention;  Surgeon: Nada Libman, MD;  Location: [facility] INVASIVE CV LAB;  Service: Cardiovascular;  Laterality: Left;  common external  . PERIPHERAL VASCULAR THROMBECTOMY Left 11/03/2016   Procedure: Peripheral Vascular Thrombectomy;  Surgeon: Maeola Harman, MD;  Location: [facility] INVASIVE CV LAB;  Service: Cardiovascular;  Laterality: Left;    OB History    No data available       Home Medications    Prior to Admission medications   Medication Sig Start Date End Date Taking? Authorizing Provider  acetaminophen-codeine (TYLENOL #3) 300-30 MG tablet Take 1 tablet by mouth every 8 (eight) hours as needed for moderate pain. 05/22/17 05/29/17 Yes Loletta Specter, PA-C  amLODipine (NORVASC) 10 MG tablet Take 1 tablet (10 mg total) by mouth daily. 05/22/17  Yes Loletta Specter, PA-C  carvedilol (COREG) 12.5 MG tablet Take 1 tablet (12.5 mg total) by mouth 2 (two) times daily with a meal. 05/22/17  Yes Loletta Specter, PA-C  clonazePAM (KLONOPIN) 0.5 MG tablet Take 0.5 mg by mouth 2 (two) times daily as needed for anxiety.  03/24/17  Yes [provider]  escitalopram (LEXAPRO) 20 MG tablet Take 1 tablet (20 mg total) by mouth daily. 05/22/17  Yes Loletta Specter, PA-C  gabapentin (NEURONTIN) 300 MG capsule Take 2 capsules (600 mg total) by mouth 3 (three) times daily. 05/22/17  Yes Loletta Specter, PA-C  ondansetron (ZOFRAN-ODT) 4 MG disintegrating tablet Take 1 tablet (4 mg total) by mouth every 8 (eight) hours as needed for nausea or vomiting. 05/22/17  Yes Loletta Specter, PA-C  rivaroxaban (XARELTO) 20 MG TABS  tablet Take 1 tablet (20 mg total) by mouth daily with supper. 03/27/17  Yes Loletta Specter, PA-C  sertraline (ZOLOFT) 100 MG tablet TAKE 1 TABLET  100 MG TOTAL  BY MOUTH DAILY AS NEEDED FOR DEPRESSION 04/25/17  Yes [provider]  SUMAtriptan (IMITREX) 25 MG tablet Take 1 tablet (25 mg total) by mouth daily. May take one more tablet two hours after the first. No more than two tablets  per day. 05/22/17  Yes Loletta Specter, PA-C  topiramate (TOPAMAX) 25 MG tablet Take 1 tablet (25 mg total) by mouth at bedtime. 05/22/17  Yes Loletta Specter, PA-C  traZODone (DESYREL) 50 MG tablet Take 1 tablet (50 mg total) by mouth at bedtime. Patient taking differently: Take 50 mg by mouth at bedtime as needed for sleep.  03/27/17  Yes Loletta Specter, PA-C  methocarbamol (ROBAXIN) 500 MG tablet TAKE 1 TABLET BY MOUTH THREE TIMES A DAY Patient not taking: Reported on 05/27/2017 04/06/17   Loletta Specter, PA-C    Family History No family history on file.  Social History Social History  Substance Use Topics  . Smoking status: Current Every Day Smoker  . Smokeless tobacco: Never Used  . Alcohol use No     Allergies   Patient has no known allergies.   Review of Systems Review of Systems  Constitutional:       Per HPI, otherwise negative  HENT:       Per HPI, otherwise negative  Respiratory:       Per HPI, otherwise negative  Cardiovascular:       Per HPI, otherwise negative  Gastrointestinal: Negative for vomiting.  Endocrine:       Negative aside from HPI  Genitourinary:       Neg aside from HPI   Musculoskeletal:       Per HPI, otherwise negative  Skin: Negative.   Neurological: Negative for syncope.     Physical Exam Updated Vital Signs BP 131/80 (BP Location: Left Arm)   Pulse 72   Temp 98.7 F (37.1 C) (Oral)   Resp 18   Ht  (1.676 m)   Wt 64.9 kg (143 lb)   LMP 05/08/2017 (Approximate)   SpO2 100%   BMI 23.08 kg/m   Physical Exam  Constitutional: She is oriented to person, place, and time. She appears well-developed and well-nourished. No distress.  HENT:  Head: Normocephalic and atraumatic.  Eyes: Conjunctivae and EOM are normal.  Cardiovascular: Normal rate and regular rhythm.   Pulmonary/Chest: Effort normal and breath sounds normal. No stridor. No respiratory distress.  Abdominal: She exhibits no distension.  Musculoskeletal:  She exhibits no edema.  No gross deformities, hips, pelvis stable, patient hesitant to flex each leg secondary to pain in the low back.  Neurological: She is alert and oriented to person, place, and time. No cranial nerve deficit.  Skin: Skin is warm and dry.  Psychiatric: She has a normal mood and affect.  Nursing note and vitals reviewed.    ED Treatments / Results   Procedures Procedures (including critical care time)  Medications Ordered in ED Medications  predniSONE (DELTASONE) tablet 60 mg (60 mg Oral Given 05/27/17 1104)  ketorolac (TORADOL) 30 MG/ML injection 15 mg (15 mg Intramuscular Given 05/27/17 1105)  methocarbamol (ROBAXIN) tablet 500 mg (500 mg Oral Given 05/27/17 1104)  ondansetron (ZOFRAN-ODT) disintegrating tablet 8 mg (8 mg Oral Given 05/27/17 1104)     Initial Impression / Assessment and Plan /  ED Course  I have reviewed the triage vital signs and the nursing notes.  Pertinent labs & imaging results that were available during my care of the patient were reviewed by me and considered in my medical decision making (see chart for details).  Patient with chronic back pain presents with acute exacerbation. No red flags suggesting new neurologic phenomena. Patient is taking Xarelto, has no evidence for new DVT. Patient started on a short course of steroids, had initiation of her muscle relaxant, and discharged stable condition to pursue outpatient follow-up, as scheduled including MRI this week.  Final Clinical Impressions(s) / ED Diagnoses  Low back pain   Gerhard Munch, MD 05/27/17 1223

## 2017-05-27 NOTE — ED Notes (Signed)
Patient verbalized understanding of discharge information and medications. Patient ambulatory with standby assist to bathroom.

## 2017-05-27 NOTE — Discharge Instructions (Signed)
As discussed, your evaluation today has been largely reassuring.  But, it is important that you monitor your condition carefully, and do not hesitate to return to the ED if you develop new, or concerning changes in your condition. ? ?Otherwise, please follow-up with your physician for appropriate ongoing care. ? ?

## 2017-05-28 ENCOUNTER — Ambulatory Visit (HOSPITAL_COMMUNITY)
Admission: RE | Admit: 2017-05-28 | Discharge: 2017-05-28 | Disposition: A | Discharge status: Home / Self Care | Payer: Self-pay | Source: Ambulatory Visit | Attending: Physician Assistant | Admitting: Physician Assistant

## 2017-05-28 DIAGNOSIS — M5127 Other intervertebral disc displacement, lumbosacral region: Secondary | ICD-10-CM | POA: Insufficient documentation

## 2017-05-28 DIAGNOSIS — G8929 Other chronic pain: Secondary | ICD-10-CM

## 2017-05-28 DIAGNOSIS — M5126 Other intervertebral disc displacement, lumbar region: Secondary | ICD-10-CM | POA: Insufficient documentation

## 2017-05-28 DIAGNOSIS — M5442 Lumbago with sciatica, left side: Secondary | ICD-10-CM

## 2017-05-28 DIAGNOSIS — M5441 Lumbago with sciatica, right side: Secondary | ICD-10-CM

## 2017-05-28 MED ORDER — GADOBENATE DIMEGLUMINE 529 MG/ML IV SOLN
15.0000 mL | Freq: Once | INTRAVENOUS | Status: AC | PRN
  Administered 2017-05-28: 15 mL via INTRAVENOUS

## 2017-05-29 ENCOUNTER — Encounter (INDEPENDENT_AMBULATORY_CARE_PROVIDER_SITE_OTHER): Payer: Self-pay | Admitting: Physician Assistant

## 2017-05-29 ENCOUNTER — Telehealth (INDEPENDENT_AMBULATORY_CARE_PROVIDER_SITE_OTHER): Payer: Self-pay

## 2017-05-29 ENCOUNTER — Other Ambulatory Visit (INDEPENDENT_AMBULATORY_CARE_PROVIDER_SITE_OTHER): Payer: Self-pay | Admitting: Physician Assistant

## 2017-05-29 DIAGNOSIS — M5126 Other intervertebral disc displacement, lumbar region: Secondary | ICD-10-CM

## 2017-05-29 NOTE — Telephone Encounter (Signed)
Patients phone rang several times before disconnecting. Will attempt to call patient once more. Maryjean Morn, CMA

## 2017-05-29 NOTE — Telephone Encounter (Signed)
-----   Message from Roger David Gomez, PA-C sent at 05/29/2017  9:38 AM EDT ----- 1. Small right paracentral disc extrusion at L5-S1 contacting the right S1 nerve root. 2. Disc bulging at L3-4 and L4-5 without stenosis.  I will send to orthopedic surgeon to consult with her. Urgent referral has been placed. 

## 2017-06-03 ENCOUNTER — Telehealth (INDEPENDENT_AMBULATORY_CARE_PROVIDER_SITE_OTHER): Payer: Self-pay

## 2017-06-03 NOTE — Telephone Encounter (Signed)
Called patient and phone rang several times then disconnected. Will try once more before mailing results. Maryjean Morn, CMA

## 2017-06-03 NOTE — Telephone Encounter (Signed)
-----   Message from Roger David Gomez, PA-C sent at 05/29/2017  9:38 AM EDT ----- 1. Small right paracentral disc extrusion at L5-S1 contacting the right S1 nerve root. 2. Disc bulging at L3-4 and L4-5 without stenosis.  I will send to orthopedic surgeon to consult with her. Urgent referral has been placed. 

## 2017-06-04 ENCOUNTER — Encounter (INDEPENDENT_AMBULATORY_CARE_PROVIDER_SITE_OTHER): Payer: Self-pay

## 2017-06-04 ENCOUNTER — Telehealth (INDEPENDENT_AMBULATORY_CARE_PROVIDER_SITE_OTHER): Payer: Self-pay

## 2017-06-04 NOTE — Telephone Encounter (Signed)
Phone rang several times before disconnecting. Results mailed. Maryjean Morn, CMA

## 2017-06-04 NOTE — Telephone Encounter (Signed)
-----   Message from Loletta Specter, PA-C sent at 05/29/2017  9:38 AM EDT ----- 1. Small right paracentral disc extrusion at L5-S1 contacting the right S1 nerve root. 2. Disc bulging at L3-4 and L4-5 without stenosis.  I will send to orthopedic surgeon to consult with her. Urgent referral has been placed.

## 2017-07-06 MED FILL — XARELTO 20 MG TABLET: 20 | 30 days supply | Qty: 30 | Fill #6

## 2017-07-06 MED FILL — METHOCARBAMOL 500 MG TABS: 500 | 1 days supply | Qty: 2 | Fill #1

## 2017-07-06 MED FILL — GABAPENTIN 300 MG CAPSULE: 300 | 30 days supply | Qty: 180 | Fill #1

## 2017-07-06 MED FILL — TOPIRAMATE 25 MG TABLET: 25 | 30 days supply | Qty: 30 | Fill #1

## 2017-07-06 MED FILL — SUMATRIPTAN SUCC 25 MG TAB: 25 | 30 days supply | Qty: 9 | Fill #1

## 2017-07-06 MED FILL — SERTRALINE HCL 100 MG TAB: 100 | 30 days supply | Qty: 30 | Fill #3

## 2017-07-06 MED FILL — AMLODIPINE BESYLATE 5 MG TA: 5 | 30 days supply | Qty: 30 | Fill #1

## 2017-07-06 MED FILL — ESCITALOPRAM 20 MG TABLET: 20 | 30 days supply | Qty: 30 | Fill #1

## 2017-07-08 ENCOUNTER — Encounter (HOSPITAL_COMMUNITY): Payer: Self-pay | Admitting: Psychiatry

## 2017-07-08 ENCOUNTER — Ambulatory Visit (INDEPENDENT_AMBULATORY_CARE_PROVIDER_SITE_OTHER): Payer: No Typology Code available for payment source | Admitting: Psychiatry

## 2017-07-08 VITALS — BP 142/102 | HR 89 | Ht 66.0 in | Wt 141.0 lb

## 2017-07-08 DIAGNOSIS — F99 Mental disorder, not otherwise specified: Secondary | ICD-10-CM

## 2017-07-08 DIAGNOSIS — R4587 Impulsiveness: Secondary | ICD-10-CM

## 2017-07-08 DIAGNOSIS — R450 Nervousness: Secondary | ICD-10-CM

## 2017-07-08 DIAGNOSIS — F3181 Bipolar II disorder: Secondary | ICD-10-CM

## 2017-07-08 DIAGNOSIS — G4710 Hypersomnia, unspecified: Secondary | ICD-10-CM

## 2017-07-08 DIAGNOSIS — R5383 Other fatigue: Secondary | ICD-10-CM

## 2017-07-08 DIAGNOSIS — M549 Dorsalgia, unspecified: Secondary | ICD-10-CM

## 2017-07-08 DIAGNOSIS — G4700 Insomnia, unspecified: Secondary | ICD-10-CM

## 2017-07-08 DIAGNOSIS — R440 Auditory hallucinations: Secondary | ICD-10-CM

## 2017-07-08 DIAGNOSIS — Z62810 Personal history of physical and sexual abuse in childhood: Secondary | ICD-10-CM

## 2017-07-08 DIAGNOSIS — R51 Headache: Secondary | ICD-10-CM

## 2017-07-08 DIAGNOSIS — Z91410 Personal history of adult physical and sexual abuse: Secondary | ICD-10-CM

## 2017-07-08 DIAGNOSIS — F4310 Post-traumatic stress disorder, unspecified: Secondary | ICD-10-CM

## 2017-07-08 DIAGNOSIS — R454 Irritability and anger: Secondary | ICD-10-CM

## 2017-07-08 DIAGNOSIS — F4312 Post-traumatic stress disorder, chronic: Secondary | ICD-10-CM

## 2017-07-08 DIAGNOSIS — Z813 Family history of other psychoactive substance abuse and dependence: Secondary | ICD-10-CM

## 2017-07-08 DIAGNOSIS — Z818 Family history of other mental and behavioral disorders: Secondary | ICD-10-CM

## 2017-07-08 DIAGNOSIS — R4586 Emotional lability: Secondary | ICD-10-CM

## 2017-07-08 DIAGNOSIS — F419 Anxiety disorder, unspecified: Secondary | ICD-10-CM

## 2017-07-08 DIAGNOSIS — F5105 Insomnia due to other mental disorder: Secondary | ICD-10-CM

## 2017-07-08 MED ORDER — LURASIDONE HCL 40 MG PO TABS: 40.0000 mg | ORAL_TABLET | Freq: Every day | ORAL | 3 refills | Status: DC
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
## 2017-07-20 ENCOUNTER — Ambulatory Visit (INDEPENDENT_AMBULATORY_CARE_PROVIDER_SITE_OTHER): Payer: Self-pay | Admitting: Physician Assistant

## 2017-07-27 ENCOUNTER — Ambulatory Visit (INDEPENDENT_AMBULATORY_CARE_PROVIDER_SITE_OTHER): Payer: No Typology Code available for payment source | Admitting: Psychiatry

## 2017-07-27 ENCOUNTER — Encounter (HOSPITAL_COMMUNITY): Payer: Self-pay | Admitting: Psychiatry

## 2017-07-27 ENCOUNTER — Ambulatory Visit (HOSPITAL_COMMUNITY): Payer: Self-pay | Admitting: Psychiatry

## 2017-07-27 DIAGNOSIS — Z79899 Other long term (current) drug therapy: Secondary | ICD-10-CM

## 2017-07-27 DIAGNOSIS — Z91410 Personal history of adult physical and sexual abuse: Secondary | ICD-10-CM

## 2017-07-27 DIAGNOSIS — Z62810 Personal history of physical and sexual abuse in childhood: Secondary | ICD-10-CM

## 2017-07-27 DIAGNOSIS — F99 Mental disorder, not otherwise specified: Secondary | ICD-10-CM

## 2017-07-27 DIAGNOSIS — F3181 Bipolar II disorder: Secondary | ICD-10-CM

## 2017-07-27 DIAGNOSIS — F4312 Post-traumatic stress disorder, chronic: Secondary | ICD-10-CM

## 2017-07-27 DIAGNOSIS — F17200 Nicotine dependence, unspecified, uncomplicated: Secondary | ICD-10-CM

## 2017-07-27 DIAGNOSIS — F5105 Insomnia due to other mental disorder: Secondary | ICD-10-CM

## 2017-07-27 MED ORDER — PRAZOSIN HCL 2 MG PO CAPS: 2.0000 mg | ORAL_CAPSULE | Freq: Every day | ORAL | 4 refills | Status: DC

## 2017-07-27 MED ORDER — LURASIDONE HCL 60 MG PO TABS: 60.0000 mg | ORAL_TABLET | Freq: Every day | ORAL | 4 refills | Status: DC

## 2017-07-27 MED FILL — PRAZOSIN 2 MG CAPSULE: 2 | 30 days supply | Qty: 60 | Fill #0
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
## 2017-08-06 ENCOUNTER — Ambulatory Visit: Payer: Self-pay | Attending: Physician Assistant

## 2017-08-06 ENCOUNTER — Telehealth (HOSPITAL_COMMUNITY): Payer: Self-pay | Admitting: *Deleted

## 2017-08-13 ENCOUNTER — Other Ambulatory Visit: Payer: Self-pay

## 2017-08-13 ENCOUNTER — Encounter (INDEPENDENT_AMBULATORY_CARE_PROVIDER_SITE_OTHER): Payer: Self-pay | Admitting: Physician Assistant

## 2017-08-13 ENCOUNTER — Ambulatory Visit (INDEPENDENT_AMBULATORY_CARE_PROVIDER_SITE_OTHER): Payer: Self-pay | Admitting: Physician Assistant

## 2017-08-13 VITALS — BP 129/80 | HR 103 | Temp 98.4°F | Wt 146.4 lb

## 2017-08-13 DIAGNOSIS — R51 Headache: Secondary | ICD-10-CM

## 2017-08-13 DIAGNOSIS — R519 Headache, unspecified: Secondary | ICD-10-CM

## 2017-08-13 DIAGNOSIS — M5126 Other intervertebral disc displacement, lumbar region: Secondary | ICD-10-CM

## 2017-08-13 DIAGNOSIS — F3181 Bipolar II disorder: Secondary | ICD-10-CM

## 2017-08-13 MED ORDER — TOPIRAMATE 25 MG PO TABS: 25.0000 mg | ORAL_TABLET | Freq: Two times a day (BID) | ORAL | 1 refills | Status: DC

## 2017-08-13 MED ORDER — RISPERIDONE 0.5 MG PO TABS: 0.5000 mg | ORAL_TABLET | Freq: Every day | ORAL | 2 refills | Status: DC

## 2017-08-13 MED ORDER — ACETAMINOPHEN-CODEINE #3 300-30 MG PO TABS: 1.0000 | ORAL_TABLET | Freq: Three times a day (TID) | ORAL | 0 refills | Status: AC | PRN

## 2017-08-13 MED FILL — TOPIRAMATE 25 MG TABLET: 25 | 30 days supply | Qty: 60 | Fill #0

## 2017-08-13 MED FILL — ACETAMINOPHEN/COD #3 TABLET: 300-30 | 14 days supply | Qty: 42 | Fill #0

## 2017-08-13 MED FILL — risperiDONE 0.5 MG TABS: 0.5 | 30 days supply | Qty: 30 | Fill #0

## 2017-08-13 NOTE — Patient Instructions (Signed)
Bipolar 2 Disorder Bipolar 2 disorder is a mental health disorder in which a person has episodes of emotional highs (mania) and lows (depression). Bipolar 2 is different from other bipolar disorders because the manic episodes are not as high and do not last as long. This is called hypomania. People with bipolar 2 disorder usually go back and forth between hypomanic and depressive episodes. What are the causes? The cause of this condition is not known. What increases the risk? The following factors may make you more likely to develop this condition:  Having a family member with the disorder.  An imbalance of certain chemicals in the brain (neurotransmitters).  Stress, such as a death, illness, or financial problems.  Certain conditions that affect the brain or spinal cord (neurologic conditions).  Brain injury (trauma).  Having another mental health disorder, such as: ? Obsessive compulsive disorder. ? Schizophrenia.  What are the signs or symptoms? Symptoms of hypomania include:  Very high self-esteem or self-confidence.  Decreased need for sleep.  Unusual talkativeness or feeling a need to keep talking. Speech may be very fast. It may seem like you cannot stop talking.  Racing thoughts or constant talking, with quick shifts between topics that may or may not be related (flight of ideas).  Decreased ability to focus or concentrate.  Increased purposeful activity, such as work, studies, or social activity.  Increased nonproductive activity. This could be pacing, squirming and fidgeting, or finger and toe tapping.  Impulsive behavior and poor judgment. This may result in high-risk activities, such as having unprotected sex or spending a lot of money.  Symptoms of depression include:  Feeling sad, hopeless, or helpless.  Frequent or uncontrollable crying.  Lack of feeling or caring about anything.  Sleeping too much.  Moving more slowly than usual.  Not being able to  enjoy things you used to enjoy.  Desire to be alone all the time.  Feeling guilty or worthless.  Lack of energy or motivation.  Trouble concentrating or remembering.  Trouble making decisions.  Increased appetite.  Thoughts of death or desire to harm yourself.  How is this diagnosed? To diagnose bipolar 2 disorder, your health care provider may ask about your:  Emotional episodes.  Medical history.  Alcohol and drug use. This includes prescription medicines. Certain medical conditions and substances can cause symptoms that seem like bipolar disorder (secondary bipolar disorder).  How is this treated? Bipolar 2 disorder is a long-term (chronic) illness. It is best controlled with ongoing (continuous) treatment rather than being treated only when symptoms occur. Treatment may include:  Psychotherapy. Some forms of talk therapy, such as cognitive-behavioral therapy (CBT), can provide support, education, and guidance.  Coping strategies, such as journaling or relaxation exercises. Relaxation exercises include: ? Yoga. ? Meditation. ? Deep breathing.  Lifestyle changes, such as: ? Limiting alcohol and drug use. ? Exercising regularly. ? Getting plenty of sleep. ? Making healthy eating choices.  Medicine. Medicine can be prescribed by a health care provider who specializes in treating mental disorders (psychiatrist). ? Medicines called mood stabilizers are usually prescribed. ? If symptoms occur even while taking a mood stabilizer, other medicines may be added.  A combination of medicine, talk therapy, and coping methods is the best way to treat this condition. Follow these instructions at home: Activity  Return to your normal activities as told by your health care provider.  Find activities that you enjoy, and make time to do them.  Exercise regularly as told by your health   care provider. Lifestyle  Limit alcohol intake to no more than 1 drink a day for nonpregnant  women and 2 drinks a day for men. One drink equals 12 oz of beer, 5 oz of wine, or 1 oz of hard liquor.  Follow a set schedule for eating and sleeping.  Eat a balanced diet that includes fresh fruits and vegetables, whole grains, low-fat dairy, and lean meats.  Get at least 7-8 hours of sleep each night. General instructions  Take over-the-counter and prescription medicines only as told by your health care provider.  Think about joining a support group. Your health care provider may be able to recommend a support group.  Talk with your family and loved ones about your treatment goals and how they can help.  Keep all follow-up visits as told by your health care provider. This is important. Where to find more information: For more information about bipolar 2 disorder, visit the following websites:  National Alliance on Mental Illness: www.nami.org  U.S. National Institute of Mental Health: www.nimh.nih.gov  Contact a health care provider if:  Your symptoms get worse.  You have side effects from your medicine, and they get worse.  You have trouble sleeping.  You have trouble doing daily activities.  You feel unsafe in your surroundings.  You are dealing with substance abuse. Get help right away if:  You have new symptoms.  You have thoughts about harming yourself or others.  You harm yourself. Summary  Bipolar 2 disorder is a mental health disorder in which a person has episodes of hypomania and depression.  Bipolar 2 is best treated through a combination of medicines, talk therapy, and coping strategies.  Talk with your family and loved ones about your treatment goals and how they can help. This information is not intended to replace advice given to you by your health care provider. Make sure you discuss any questions you have with your health care provider. Document Released: 09/16/2016 Document Revised: 09/16/2016 Document Reviewed: 09/16/2016 Elsevier Interactive  Patient Education  2018 Elsevier Inc.  

## 2017-08-13 NOTE — Progress Notes (Signed)
Subjective:  Patient ID: Christina [name], female    DOB: 05/05/1971  Age: 46 y.o. MRN: [id number]  CC: depression and anxiety  HPI Christina [name] is a 46 y.o. female with a medical history of HTN, anxiety, and back pain presents to f/u on anxiety and depression. Says she is goiing to pyschiatrist and was diagnosed with Bipolar 2 disorder and PTSD. He took her off Topiramate and escitalopram.  Next appointment with psychiatrist in the third week of January.     Outpatient Medications Prior to Visit  Medication Sig Dispense Refill  . amLODipine (NORVASC) 10 MG tablet Take 1 tablet (10 mg total) by mouth daily. 90 tablet 1  . carvedilol (COREG) 12.5 MG tablet Take 1 tablet (12.5 mg total) by mouth 2 (two) times daily with a meal. 90 tablet 1  . gabapentin (NEURONTIN) 300 MG capsule Take 2 capsules (600 mg total) by mouth 3 (three) times daily. 180 capsule 1  . Lurasidone HCl 60 MG TABS Take 1 tablet (60 mg total) by mouth at bedtime. 30 tablet 4  . methocarbamol (ROBAXIN) 500 MG tablet Take 1 tablet (500 mg total) by mouth 3 (three) times daily. 21 tablet 0  . ondansetron (ZOFRAN-ODT) 4 MG disintegrating tablet Take 1 tablet (4 mg total) by mouth every 8 (eight) hours as needed for nausea or vomiting. 21 tablet 0  . prazosin (MINIPRESS) 2 MG capsule Take 1 capsule (2 mg total) by mouth at bedtime. Take 1-2 capsules at night for sleep/nightmares 60 capsule 4  . rivaroxaban (XARELTO) 20 MG TABS tablet Take 1 tablet (20 mg total) by mouth daily with supper. 90 tablet 3  . SUMAtriptan (IMITREX) 25 MG tablet Take 1 tablet (25 mg total) by mouth daily. May take one more tablet two hours after the first. No more than two tablets per day. 9 tablet 1   No facility-administered medications prior to visit.      ROS Review of Systems  Constitutional: Negative for chills, fever and malaise/fatigue.  Eyes: Negative for blurred vision.  Respiratory: Negative for shortness of breath.   Cardiovascular:  Negative for chest pain and palpitations.  Gastrointestinal: Negative for abdominal pain and nausea.  Genitourinary: Negative for dysuria and hematuria.  Musculoskeletal: Negative for joint pain and myalgias.  Skin: Negative for rash.  Neurological: Negative for tingling and headaches.  Psychiatric/Behavioral: Negative for depression. The patient is not nervous/anxious.     Objective:  BP 129/80 (BP Location: Left Arm, Patient Position: Sitting, Cuff Size: Normal)   Pulse (!) 103   Temp 98.4 F (36.9 C) (Oral)   Wt 146 lb 6.4 oz (66.4 kg)   SpO2 100%   BMI 23.63 kg/m   BP/Weight 08/13/2017 05/27/2017 05/22/2017  Systolic BP 129 137 145  Diastolic BP 80 76 89  Wt. (Lbs) 146.4 143 143.2  BMI 23.63 23.08 23.47  Some encounter information is confidential and restricted. Go to Review Flowsheets activity to see all data.      Physical Exam   Assessment & Plan:    1. Bipolar 2 disorder (HCC  2. Nonintractable headache, unspecified chronicity pattern, unspecified headache type - topiramate (TOPAMAX) 25 MG tablet; Take 1 tablet (25 mg total) by mouth 2 (two) times daily.  Dispense: 60 tablet; Refill: 1  3. Lumbar herniated disc - AMB referral to orthopedics   Meds ordered this encounter  Medications  . risperiDONE (RISPERDAL) 0.5 MG tablet    Sig: Take 1 tablet (0.5 mg total) by mouth at bedtime.  Dispense:  30 tablet    Refill:  2    Order Specific Question:   Supervising Provider    Answer:   Quentin AngstJEGEDE, OLUGBEMIGA E [id number]  . topiramate (TOPAMAX) 25 MG tablet    Sig: Take 1 tablet (25 mg total) by mouth 2 (two) times daily.    Dispense:  60 tablet    Refill:  1    Order Specific Question:   Supervising Provider    Answer:   Quentin AngstJEGEDE, OLUGBEMIGA E [id number]  . acetaminophen-codeine (TYLENOL #3) 300-30 MG tablet    Sig: Take 1 tablet by mouth every 8 (eight) hours as needed for up to 14 days for moderate pain.    Dispense:  42 tablet    Refill:  0    Order Specific  Question:   Supervising Provider    Answer:   Quentin AngstJEGEDE, OLUGBEMIGA E [id number]    Follow-up: Return if symptoms worsen or fail to improve.   Loletta Specteroger David Rimsha Trembley PA

## 2017-08-14 ENCOUNTER — Other Ambulatory Visit: Payer: Self-pay | Admitting: *Deleted

## 2017-08-14 ENCOUNTER — Ambulatory Visit (INDEPENDENT_AMBULATORY_CARE_PROVIDER_SITE_OTHER): Payer: Self-pay | Admitting: Physician Assistant

## 2017-08-14 MED ORDER — LURASIDONE HCL 60 MG PO TABS: 60.0000 mg | ORAL_TABLET | Freq: Every day | ORAL | 3 refills | Status: DC

## 2017-08-14 NOTE — Telephone Encounter (Signed)
PRINTED FOR PASS PROGRAM 

## 2017-08-27 ENCOUNTER — Ambulatory Visit (INDEPENDENT_AMBULATORY_CARE_PROVIDER_SITE_OTHER): Payer: No Typology Code available for payment source | Admitting: Licensed Clinical Social Worker

## 2017-08-27 DIAGNOSIS — F3181 Bipolar II disorder: Secondary | ICD-10-CM

## 2017-08-27 DIAGNOSIS — F4312 Post-traumatic stress disorder, chronic: Secondary | ICD-10-CM

## 2017-08-28 ENCOUNTER — Other Ambulatory Visit (INDEPENDENT_AMBULATORY_CARE_PROVIDER_SITE_OTHER): Payer: Self-pay | Admitting: Physician Assistant

## 2017-08-28 MED FILL — ESCITALOPRAM 20 MG TABLET: 20 | 30 days supply | Qty: 30 | Fill #2

## 2017-08-28 MED FILL — PRAZOSIN 2 MG CAPSULE: 2 | 30 days supply | Qty: 60 | Fill #1

## 2017-08-28 NOTE — Telephone Encounter (Signed)
FWD to PCP. Tempestt S Roberts, CMA  

## 2017-08-30 ENCOUNTER — Encounter (HOSPITAL_COMMUNITY): Payer: Self-pay | Admitting: *Deleted

## 2017-08-30 ENCOUNTER — Emergency Department (HOSPITAL_COMMUNITY): Payer: Self-pay

## 2017-08-30 ENCOUNTER — Emergency Department (HOSPITAL_COMMUNITY)
Admission: EM | Admit: 2017-08-30 | Discharge: 2017-08-30 | Disposition: A | Discharge status: Home / Self Care | Payer: Self-pay | Attending: Emergency Medicine | Admitting: Emergency Medicine

## 2017-08-30 ENCOUNTER — Other Ambulatory Visit: Payer: Self-pay

## 2017-08-30 DIAGNOSIS — F17200 Nicotine dependence, unspecified, uncomplicated: Secondary | ICD-10-CM | POA: Insufficient documentation

## 2017-08-30 DIAGNOSIS — Z79899 Other long term (current) drug therapy: Secondary | ICD-10-CM | POA: Insufficient documentation

## 2017-08-30 DIAGNOSIS — I10 Essential (primary) hypertension: Secondary | ICD-10-CM | POA: Insufficient documentation

## 2017-08-30 DIAGNOSIS — Z86718 Personal history of other venous thrombosis and embolism: Secondary | ICD-10-CM | POA: Insufficient documentation

## 2017-08-30 DIAGNOSIS — M25551 Pain in right hip: Secondary | ICD-10-CM | POA: Insufficient documentation

## 2017-08-30 LAB — I-STAT BETA HCG BLOOD, ED (MC, WL, AP ONLY): I-stat hCG, quantitative: <5 m[IU]/mL (ref <5)

## 2017-08-30 MED ORDER — DICLOFENAC SODIUM 1 % TD GEL: 2.0000 g | Freq: Four times a day (QID) | TRANSDERMAL | 0 refills | Status: DC

## 2017-08-30 MED ORDER — OXYCODONE-ACETAMINOPHEN 5-325 MG PO TABS
1.0000 | ORAL_TABLET | Freq: Once | ORAL | Status: AC
  Administered 2017-08-30: 1 via ORAL
  Filled 2017-08-30: qty 1

## 2017-08-30 MED ORDER — IBUPROFEN 400 MG PO TABS
400.0000 mg | ORAL_TABLET | Freq: Once | ORAL | Status: AC | PRN
  Administered 2017-08-30: 400 mg via ORAL
  Filled 2017-08-30: qty 1

## 2017-08-30 MED ORDER — METHOCARBAMOL 500 MG PO TABS: 500.0000 mg | ORAL_TABLET | Freq: Three times a day (TID) | ORAL | 0 refills | Status: DC | PRN

## 2017-08-30 MED ORDER — PREDNISONE 10 MG (21) PO TBPK: ORAL_TABLET | ORAL | 0 refills | Status: DC

## 2017-08-30 MED ORDER — PREDNISONE 20 MG PO TABS
60.0000 mg | ORAL_TABLET | Freq: Once | ORAL | Status: AC
Start: 2017-08-30 — End: 2017-08-30
  Administered 2017-08-30: 60 mg via ORAL
  Filled 2017-08-30: qty 3

## 2017-08-30 NOTE — ED Provider Notes (Signed)
[facility] EMERGENCY DEPARTMENT Provider Note   CSN: [id number] Arrival date & time: 08/30/17  1104     History   Chief Complaint Chief Complaint  Patient presents with  . Back Pain    HPI Maclovia Uher is a 47 y.o. female with a hx of tobacco abuse, HTN, and chronic back pain who presents to the ED complaining of R hip pain that started 4 days ago. Patient states she has had back problems for 20+ years, states her PCP Dr. Lily Kocher obtained MRI 03/2017 with abnormalities that prompted orthopedic referral for plans for surgery.  Patient has had some difficulty scheduling orthopedic appointment due to financial burden.  States she has chronic lower back pain with bilateral sciatica. Back pain is unchanged, however now having R hip pain. Patient states hip feels unstable like it might dislocate with pain especially with ambulating. Rates the pain a 10/10 in severity that improves with nothing and worsens with walking. Has tried hot and cold compression and ibuprofen all without relief. States right hip and buttocks area is tingling and burning. Patient states she has chronic LE weakness, this is unchanged. Denies fever, chills, incontinence to bowel/bladder, saddle anesthesia, personal hx of cancer or IV drug use. No recent falls or surgeries. Patient states hx of LLE DVT- does not feel similar at all- Denies calf pain/swelling, recent surgery/trauma, recent long travel, hormone use, or personal hx of cancer.      HPI  Past Medical History:  Diagnosis Date  . Anxiety   . Back pain   . DVT (deep venous thrombosis) (HCC)    left leg with stent    Patient Active Problem List   Diagnosis Date Noted  . Hypertension 05/22/2017  . Migraine without aura and with status migrainosus, not intractable 05/22/2017  . Tachycardia 05/22/2017  . Insomnia 03/04/2017  . Chronic bilateral low back pain with bilateral sciatica 01/26/2017  . Depression with anxiety 01/26/2017  . DVT  (deep venous thrombosis) (HCC) 11/03/2016    Past Surgical History:  Procedure Laterality Date  . INTRAVASCULAR ULTRASOUND/IVUS Left 11/03/2016   Procedure: Intravascular Ultrasound/IVUS;  Surgeon: Maeola Harman, MD;  Location: [facility] INVASIVE CV LAB;  Service: Cardiovascular;  Laterality: Left;  . LOWER EXTREMITY VENOGRAPHY Left 11/04/2016   Procedure: Lower Extremity Venography;  Surgeon: Nada Libman, MD;  Location: [facility] INVASIVE CV LAB;  Service: Cardiovascular;  Laterality: Left;  . PERIPHERAL VASCULAR INTERVENTION Left 11/04/2016   Procedure: Peripheral Vascular Intervention;  Surgeon: Nada Libman, MD;  Location: [facility] INVASIVE CV LAB;  Service: Cardiovascular;  Laterality: Left;  common external  . PERIPHERAL VASCULAR THROMBECTOMY Left 11/03/2016   Procedure: Peripheral Vascular Thrombectomy;  Surgeon: Maeola Harman, MD;  Location: [facility] INVASIVE CV LAB;  Service: Cardiovascular;  Laterality: Left;    OB History    No data available       Home Medications    Prior to Admission medications   Medication Sig Start Date End Date Taking? Authorizing Provider  amLODipine (NORVASC) 10 MG tablet Take 1 tablet (10 mg total) by mouth daily. 05/22/17   Loletta Specter, PA-C  carvedilol (COREG) 12.5 MG tablet Take 1 tablet (12.5 mg total) by mouth 2 (two) times daily with a meal. 05/22/17   Loletta Specter, PA-C  escitalopram (LEXAPRO) 20 MG tablet TAKE 1 TABLET BY MOUTH ONCE DAILY. 08/28/17   Loletta Specter, PA-C  Lurasidone HCl (LATUDA) 60 MG TABS Take 1 tablet (60 mg total) by mouth  at bedtime. 08/14/17   Quentin Angst, MD  methocarbamol (ROBAXIN) 500 MG tablet Take 1 tablet (500 mg total) by mouth 3 (three) times daily. 05/27/17   Gerhard Munch, MD  ondansetron (ZOFRAN-ODT) 4 MG disintegrating tablet Take 1 tablet (4 mg total) by mouth every 8 (eight) hours as needed for nausea or vomiting. 05/22/17   Loletta Specter, PA-C  prazosin (MINIPRESS) 2 MG capsule  Take 1 capsule (2 mg total) by mouth at bedtime. Take 1-2 capsules at night for sleep/nightmares 07/27/17   Burnard Leigh, MD  risperiDONE (RISPERDAL) 0.5 MG tablet Take 1 tablet (0.5 mg total) by mouth at bedtime. 08/13/17   Loletta Specter, PA-C  rivaroxaban (XARELTO) 20 MG TABS tablet Take 1 tablet (20 mg total) by mouth daily with supper. 03/27/17   Loletta Specter, PA-C  SUMAtriptan (IMITREX) 25 MG tablet Take 1 tablet (25 mg total) by mouth daily. May take one more tablet two hours after the first. No more than two tablets per day. 05/22/17   Loletta Specter, PA-C  topiramate (TOPAMAX) 25 MG tablet Take 1 tablet (25 mg total) by mouth 2 (two) times daily. 08/13/17   Loletta Specter, PA-C    Family History History reviewed. No pertinent family history.  Social History Social History   Tobacco Use  . Smoking status: Current Every Day Smoker    Packs/day: 0.25  . Smokeless tobacco: Never Used  Substance Use Topics  . Alcohol use: No  . Drug use: No     Allergies   Patient has no known allergies.   Review of Systems Review of Systems  Constitutional: Negative for chills and fever.  Gastrointestinal: Negative for abdominal pain, nausea and vomiting.  Musculoskeletal: Positive for arthralgias (R) and back pain (chronic unchanged).  Neurological: Positive for weakness (chronic LE at baseline). Negative for numbness.       Positive for paresthesias to the R hip/buttocks region. Negative for incontinence and saddle anesthesia.     Physical Exam Updated Vital Signs BP 125/76 (BP Location: Left Arm)   Pulse 77   Temp 98.8 F (37.1 C) (Oral)   Resp 16   Ht 5\' 6"  (1.676 m)   Wt 66.2 kg (146 lb)   LMP 08/26/2017   SpO2 99%   BMI 23.57 kg/m   Physical Exam  Constitutional: She appears well-developed and well-nourished. No distress.  HENT:  Head: Normocephalic and atraumatic.  Eyes: Conjunctivae are normal. Right eye exhibits no discharge. Left eye exhibits  no discharge.  Cardiovascular:  Pulses:      Dorsalis pedis pulses are 2+ on the right side, and 2+ on the left side.  Musculoskeletal:  No obvious deformity, erythema, warmth, or appreciable swelling. Back: Patient with diffuse tenderness to the lower back including diffuse lumbar spine tenderness as well as bilateral paraspinal muscle tenderness, right greater than left, extends to buttocks bilaterally.  No point tenderness. Hips: Patient is able to lift bilateral legs off of the bed, however states is very painful with her right leg.  She is tender palpation over the right sided ASIS, iliac crest, and PSIS. Also tender over greater trochanter on the right.  No other bony tenderness to the lower extremities.  Neurological: She is alert.  Clear speech.  Sensation intact to sharp and dull touch to bilateral lower extremities.  Strength difficult to assess in the lower extremities secondary to pain, 5/5 strength with L ankle dorsi/plantar flexion, 4/5 strength with R ankle dorsi/plantar flexion.  Gait is antalgic.   Psychiatric: She has a normal mood and affect. Her behavior is normal. Thought content normal.  Nursing note and vitals reviewed.  ED Treatments / Results  Labs (all labs ordered are listed, but only abnormal results are displayed) Labs Reviewed - No data to display  EKG  EKG Interpretation None       Radiology Dg Hip Unilat With Pelvis 2-3 Views Right  Result Date: 08/30/2017 CLINICAL DATA:  Chronic lower back pain. Pain radiating to right hip. EXAM: DG HIP (WITH OR WITHOUT PELVIS) 2-3V RIGHT COMPARISON:  None. FINDINGS: There is a vascular stent in a left iliac vessel. The lower lumbar spine is unremarkable as is the sacrum. The iliac bones are normal. No fractures or dislocations. No significant degenerative changes in the right hip. IMPRESSION: No cause for right hip pain identified. Electronically Signed   By: Gerome Samavid  Williams III M.D   On: 08/30/2017 15:51     Procedures Procedures (including critical care time)  Medications Ordered in ED Medications  ibuprofen (ADVIL,MOTRIN) tablet 400 mg (400 mg Oral Given 08/30/17 1153)     Initial Impression / Assessment and Plan / ED Course  I have reviewed the triage vital signs and the nursing notes.  Pertinent labs & imaging results that were available during my care of the patient were reviewed by me and considered in my medical decision making (see chart for details).   Patient presents complaining of right hip pain.  She is nontoxic-appearing, vitals within normal limits.  Patient is diffusely tender to the right hip as well as the lower back and right buttocks.  There is not one focal area that is more tender than others.  Patient is neurovascularly intact distally.  Given new onset of pain will obtain radiograph of the right hip with pelvis views.  Given no change in back discomfort with recent MRI in October do not think that repeat imaging of the back is necessary at this time.  MRI reviewed from 10/04 and patient has disc extrusion at L5-S1 contacting the S1 nerve root as well as disc bulging at L3-L4 and L4-L5 without stenosis.  Patient is without lower extremity swelling, recent surgery/trauma, recent long travel, hormone use, or personal hx of cancer, patient states does not feel remotely similar to previous DVT, doubt DVT. Will treat pain with Percocet in the emergency department with Xray pending.  X-ray negative for acute abnormality.  Patient improved following Percocet, still with some discomfort.  Patient is hemodynamically stable and neurovascularly intact distally.  Will treat with Fernisone, Robaxin, and Voltaren gel.  Instructed patient is not to drive or operate heavy machinery while taking Robaxin. I discussed results, treatment plan, need for PCP and/or orthopedics follow-up, and return precautions with the patient. Provided opportunity for questions, patient confirmed understanding and  is in agreement with plan.   Final Clinical Impressions(s) / ED Diagnoses   Final diagnoses:  Right hip pain    ED Discharge Orders        Ordered    predniSONE (STERAPRED UNI-PAK 21 TAB) 10 MG (21) TBPK tablet     08/30/17 1617    methocarbamol (ROBAXIN) 500 MG tablet  Every 8 hours PRN     08/30/17 1617    diclofenac sodium (VOLTAREN) 1 % GEL  4 times daily     08/30/17 1617       Niquita Digioia, Meridian VillageSamantha R, PA-C 08/30/17 1748    Loren RacerYelverton, David, MD 08/30/17 2005

## 2017-08-30 NOTE — ED Triage Notes (Signed)
Pt states chronic lower back pain, intermittent.  Pt is due to have back surgery but is waiting on insurance clearance.  States over last 3 days pain is radiating to R hip.

## 2017-08-30 NOTE — Discharge Instructions (Signed)
Please read and follow all provided instructions.  You have been seen today for right hip pain  Tests performed today include: An x-ray of the affected area - does NOT show any broken bones or dislocations.  Vital signs. See below for your results today.   I have prescribed you multiple medications to treat your discomfort:   - Diclofenac gel is a nonsteroidal anti-inflammatory medication that will help with pain and swelling. You can apply this gel directly to your area of discomfort 4 times per day.   - Robaxin is the muscle relaxer I have prescribed, this is meant to help with muscle tightness. Be aware that this medication may make you drowsy therefore the first time you take this it should be at a time you are in an environment where you can rest. Do not drive or operate heavy machinery when taking this medication.   - Prednisone- this is a steroid to help with pain and inflammation.   In addition you may also take Tylenol. Tylenol is generally safe, though you should not take more than 8 of the extra strength (500mg ) pills a day.  Try applying heat or ice, whichever feels better.  We also asked the pharmacist with the over-the-counter lidocaine patches are that she may apply these to your area of discomfort.  Follow-up with your primary care provider this week for re-evaluation, if able with insurance difficulties follow-up with the orthopedic doctor your primary care provider recommended or the orthopedic doctor in your discharge instructions.  However if you develop severe or worsening pain, low back pain with fever, numbness, weakness, loss of bowel or bladder control, or inability to walk or urinate, you should return to the ER immediately.  Please follow up with your doctor this week for a recheck if still having symptoms.

## 2017-08-30 NOTE — ED Notes (Signed)
Declined W/C at D/C and was escorted to lobby by RN. 
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
## 2017-09-02 MED FILL — METHOCARBAMOL 500 MG TABS: 500 | 10 days supply | Qty: 30 | Fill #0

## 2017-09-02 MED FILL — predniSONE 10 MG TABS: 10 | 6 days supply | Qty: 21 | Fill #0

## 2017-09-03 ENCOUNTER — Ambulatory Visit (HOSPITAL_COMMUNITY): Payer: Self-pay | Admitting: Licensed Clinical Social Worker

## 2017-09-03 MED FILL — TOPIRAMATE 25 MG TABLET: 25 | 30 days supply | Qty: 30 | Fill #2

## 2017-09-03 MED FILL — SERTRALINE HCL 100 MG TAB: 100 | 30 days supply | Qty: 30 | Fill #0

## 2017-09-03 MED FILL — XARELTO 20 MG TABLET: 20 | 30 days supply | Qty: 30 | Fill #0

## 2017-09-04 ENCOUNTER — Telehealth (HOSPITAL_COMMUNITY): Payer: Self-pay | Admitting: Licensed Clinical Social Worker

## 2017-09-17 MED FILL — $Latuda 60mg tablet: 60 | 30 days supply | Qty: 30 | Fill #0

## 2017-09-21 ENCOUNTER — Ambulatory Visit: Payer: Self-pay | Attending: Physician Assistant

## 2017-09-30 ENCOUNTER — Other Ambulatory Visit (INDEPENDENT_AMBULATORY_CARE_PROVIDER_SITE_OTHER): Payer: Self-pay | Admitting: Physician Assistant

## 2017-09-30 DIAGNOSIS — M5126 Other intervertebral disc displacement, lumbar region: Secondary | ICD-10-CM | POA: Insufficient documentation

## 2017-10-05 ENCOUNTER — Ambulatory Visit (INDEPENDENT_AMBULATORY_CARE_PROVIDER_SITE_OTHER): Payer: Self-pay | Admitting: Physician Assistant

## 2017-10-06 ENCOUNTER — Ambulatory Visit (INDEPENDENT_AMBULATORY_CARE_PROVIDER_SITE_OTHER): Payer: Self-pay | Admitting: Physician Assistant

## 2017-10-08 ENCOUNTER — Ambulatory Visit (INDEPENDENT_AMBULATORY_CARE_PROVIDER_SITE_OTHER): Payer: Self-pay | Admitting: Physician Assistant

## 2017-10-08 MED FILL — XARELTO 20 MG TABLET: 20 | 30 days supply | Qty: 30 | Fill #1

## 2017-10-08 MED FILL — ?CARVEDILOL 12.5 MG TABLET: 12.5 | 30 days supply | Qty: 60 | Fill #0

## 2017-10-08 MED FILL — $Latuda 60mg tablet: 60 | 30 days supply | Qty: 30 | Fill #1

## 2017-10-13 ENCOUNTER — Encounter (INDEPENDENT_AMBULATORY_CARE_PROVIDER_SITE_OTHER): Payer: Self-pay | Admitting: Physician Assistant

## 2017-10-13 ENCOUNTER — Ambulatory Visit (INDEPENDENT_AMBULATORY_CARE_PROVIDER_SITE_OTHER): Payer: Self-pay | Admitting: Physician Assistant

## 2017-10-13 VITALS — BP 121/82 | HR 71 | Temp 98.0°F | Resp 18 | Ht 66.0 in | Wt 148.0 lb

## 2017-10-13 DIAGNOSIS — G8929 Other chronic pain: Secondary | ICD-10-CM

## 2017-10-13 DIAGNOSIS — F319 Bipolar disorder, unspecified: Secondary | ICD-10-CM

## 2017-10-13 DIAGNOSIS — M5441 Lumbago with sciatica, right side: Secondary | ICD-10-CM

## 2017-10-13 DIAGNOSIS — M5126 Other intervertebral disc displacement, lumbar region: Secondary | ICD-10-CM

## 2017-10-13 DIAGNOSIS — M5442 Lumbago with sciatica, left side: Secondary | ICD-10-CM

## 2017-10-13 MED ORDER — HYDROCODONE-ACETAMINOPHEN 5-325 MG PO TABS: 1.0000 | ORAL_TABLET | Freq: Every day | ORAL | 0 refills | Status: DC

## 2017-10-13 MED ORDER — GABAPENTIN 300 MG PO CAPS: 600.0000 mg | ORAL_CAPSULE | Freq: Three times a day (TID) | ORAL | 3 refills | Status: DC

## 2017-10-13 MED FILL — GABAPENTIN 300 MG CAPSULE: 300 | 30 days supply | Qty: 180 | Fill #0

## 2017-10-13 NOTE — Patient Instructions (Signed)
Living With Depression Everyone experiences occasional disappointment, sadness, and loss in their lives. When you are feeling down, blue, or sad for at least 2 weeks in a row, it may mean that you have depression. Depression can affect your thoughts and feelings, relationships, daily activities, and physical health. It is caused by changes in the way your brain functions. If you receive a diagnosis of depression, your health care provider will tell you which type of depression you have and what treatment options are available to you. If you are living with depression, there are ways to help you recover from it and also ways to prevent it from coming back. How to cope with lifestyle changes Coping with stress Stress is your body's reaction to life changes and events, both good and bad. Stressful situations may include:  Getting married.  The death of a spouse.  Losing a job.  Retiring.  Having a baby.  Stress can last just a few hours or it can be ongoing. Stress can play a major role in depression, so it is important to learn both how to cope with stress and how to think about it differently. Talk with your health care provider or a counselor if you would like to learn more about stress reduction. He or she may suggest some stress reduction techniques, such as:  Music therapy. This can include creating music or listening to music. Choose music that you enjoy and that inspires you.  Mindfulness-based meditation. This kind of meditation can be done while sitting or walking. It involves being aware of your normal breaths, rather than trying to control your breathing.  Centering prayer. This is a kind of meditation that involves focusing on a spiritual word or phrase. Choose a word, phrase, or sacred image that is meaningful to you and that brings you peace.  Deep breathing. To do this, expand your stomach and inhale slowly through your nose. Hold your breath for 3-5 seconds, then exhale  slowly, allowing your stomach muscles to relax.  Muscle relaxation. This involves intentionally tensing muscles then relaxing them.  Choose a stress reduction technique that fits your lifestyle and personality. Stress reduction techniques take time and practice to develop. Set aside 5-15 minutes a day to do them. Therapists can offer training in these techniques. The training may be covered by some insurance plans. Other things you can do to manage stress include:  Keeping a stress diary. This can help you learn what triggers your stress and ways to control your response.  Understanding what your limits are and saying no to requests or events that lead to a schedule that is too full.  Thinking about how you respond to certain situations. You may not be able to control everything, but you can control how you react.  Adding humor to your life by watching funny films or TV shows.  Making time for activities that help you relax and not feeling guilty about spending your time this way.  Medicines Your health care provider may suggest certain medicines if he or she feels that they will help improve your condition. Avoid using alcohol and other substances that may prevent your medicines from working properly (may interact). It is also important to:  Talk with your pharmacist or health care provider about all the medicines that you take, their possible side effects, and what medicines are safe to take together.  Make it your goal to take part in all treatment decisions (shared decision-making). This includes giving input on the side   effects of medicines. It is best if shared decision-making with your health care provider is part of your total treatment plan.  If your health care provider prescribes a medicine, you may not notice the full benefits of it for 4-8 weeks. Most people who are treated for depression need to be on medicine for at least 6-12 months after they feel better. If you are taking  medicines as part of your treatment, do not stop taking medicines without first talking to your health care provider. You may need to have the medicine slowly decreased (tapered) over time to decrease the risk of harmful side effects. Relationships Your health care provider may suggest family therapy along with individual therapy and drug therapy. While there may not be family problems that are causing you to feel depressed, it is still important to make sure your family learns as much as they can about your mental health. Having your family's support can help make your treatment successful. How to recognize changes in your condition Everyone has a different response to treatment for depression. Recovery from major depression happens when you have not had signs of major depression for two months. This may mean that you will start to:  Have more interest in doing activities.  Feel less hopeless than you did 2 months ago.  Have more energy.  Overeat less often, or have better or improving appetite.  Have better concentration.  Your health care provider will work with you to decide the next steps in your recovery. It is also important to recognize when your condition is getting worse. Watch for these signs:  Having fatigue or low energy.  Eating too much or too little.  Sleeping too much or too little.  Feeling restless, agitated, or hopeless.  Having trouble concentrating or making decisions.  Having unexplained physical complaints.  Feeling irritable, angry, or aggressive.  Get help as soon as you or your family members notice these symptoms coming back. How to get support and help from others How to talk with friends and family members about your condition Talking to friends and family members about your condition can provide you with one way to get support and guidance. Reach out to trusted friends or family members, explain your symptoms to them, and let them know that you are  working with a health care provider to treat your depression. Financial resources Not all insurance plans cover mental health care, so it is important to check with your insurance carrier. If paying for co-pays or counseling services is a problem, search for a local or county mental health care center. They may be able to offer public mental health care services at low or no cost when you are not able to see a private health care provider. If you are taking medicine for depression, you may be able to get the generic form, which may be less expensive. Some makers of prescription medicines also offer help to patients who cannot afford the medicines they need. Follow these instructions at home:  Get the right amount and quality of sleep.  Cut down on using caffeine, tobacco, alcohol, and other potentially harmful substances.  Try to exercise, such as walking or lifting small weights.  Take over-the-counter and prescription medicines only as told by your health care provider.  Eat a healthy diet that includes plenty of vegetables, fruits, whole grains, low-fat dairy products, and lean protein. Do not eat a lot of foods that are high in solid fats, added sugars, or salt.    Keep all follow-up visits as told by your health care provider. This is important. Contact a health care provider if:  You stop taking your antidepressant medicines, and you have any of these symptoms: ? Nausea. ? Headache. ? Feeling lightheaded. ? Chills and body aches. ? Not being able to sleep (insomnia).  You or your friends and family think your depression is getting worse. Get help right away if:  You have thoughts of hurting yourself or others. If you ever feel like you may hurt yourself or others, or have thoughts about taking your own life, get help right away. You can go to your nearest emergency department or call:  Your local emergency services (911 in the [place]  A suicide crisis helpline, such as the  National Suicide Prevention Lifeline at 1-800-273-8255. This is open 24-hours a day.  Summary  If you are living with depression, there are ways to help you recover from it and also ways to prevent it from coming back.  Work with your health care team to create a management plan that includes counseling, stress management techniques, and healthy lifestyle habits. This information is not intended to replace advice given to you by your health care provider. Make sure you discuss any questions you have with your health care provider. Document Released: 07/14/2016 Document Revised: 07/14/2016 Document Reviewed: 07/14/2016 Elsevier Interactive Patient Education  2018 [employer]  

## 2017-10-13 NOTE — Progress Notes (Signed)
Subjective:  Patient ID: Christina [name], female    DOB: 1971/08/02  Age: 47 y.o. MRN: [id number]  CC: back pain   HPI Christina [name] a 47 y.o.femalewith a medical history of HTN, anxiety, and back pain presents with chronic back pain and anxiety/depression. Had gone to psychiatrist which diagnosed bipolar disorder. Says Latuda rx with psychiatry has been of little to no relief for her depression. Risperidone 0.5 mg was prescribed here and is reportedly of little relief and was mostly effective in helping her sleep. Main complaint is of depression, irritability, and anhedonia. Says she can not drive any longer because of the inability to brake secondary to right sided sciatic pain. Feels she is highly dependent on other people to have simple tasks done such as grocery shopping. Patient attributes symptoms to chronic lower back pain and social factors. Next psychiatry appointment 10/19/17. Next ortho appointment is 10/28/17.     Outpatient Medications Prior to Visit  Medication Sig Dispense Refill  . amLODipine (NORVASC) 10 MG tablet Take 1 tablet (10 mg total) by mouth daily. 90 tablet 1  . carvedilol (COREG) 12.5 MG tablet Take 1 tablet (12.5 mg total) by mouth 2 (two) times daily with a meal. 90 tablet 1  . diclofenac sodium (VOLTAREN) 1 % GEL Apply 2 g topically 4 (four) times daily. 100 g 0  . escitalopram (LEXAPRO) 20 MG tablet TAKE 1 TABLET BY MOUTH ONCE DAILY. 30 tablet 2  . Lurasidone HCl (LATUDA) 60 MG TABS Take 1 tablet (60 mg total) by mouth at bedtime. 180 tablet 3  . methocarbamol (ROBAXIN) 500 MG tablet Take 1 tablet (500 mg total) by mouth every 8 (eight) hours as needed for muscle spasms. 30 tablet 0  . ondansetron (ZOFRAN-ODT) 4 MG disintegrating tablet Take 1 tablet (4 mg total) by mouth every 8 (eight) hours as needed for nausea or vomiting. 21 tablet 0  . prazosin (MINIPRESS) 2 MG capsule Take 1 capsule (2 mg total) by mouth at bedtime. Take 1-2 capsules at night for  sleep/nightmares 60 capsule 4  . risperiDONE (RISPERDAL) 0.5 MG tablet Take 1 tablet (0.5 mg total) by mouth at bedtime. 30 tablet 2  . rivaroxaban (XARELTO) 20 MG TABS tablet Take 1 tablet (20 mg total) by mouth daily with supper. 90 tablet 3  . SUMAtriptan (IMITREX) 25 MG tablet Take 1 tablet (25 mg total) by mouth daily. May take one more tablet two hours after the first. No more than two tablets per day. 9 tablet 1  . topiramate (TOPAMAX) 25 MG tablet Take 1 tablet (25 mg total) by mouth 2 (two) times daily. 60 tablet 1  . predniSONE (STERAPRED UNI-PAK 21 TAB) 10 MG (21) TBPK tablet 6, 5, 4, 3, 2, 1 Take as written 21 tablet 0   No facility-administered medications prior to visit.      ROS Review of Systems  Constitutional: Negative for chills, fever and malaise/fatigue.  Eyes: Negative for blurred vision.  Respiratory: Negative for shortness of breath.   Cardiovascular: Negative for chest pain and palpitations.  Gastrointestinal: Negative for abdominal pain and nausea.  Genitourinary: Negative for dysuria and hematuria.  Musculoskeletal: Positive for back pain. Negative for joint pain and myalgias.  Skin: Negative for rash.  Neurological: Negative for tingling and headaches.  Psychiatric/Behavioral: Positive for depression. The patient is nervous/anxious.     Objective:  BP 121/82 (BP Location: Left Arm, Patient Position: Sitting, Cuff Size: Normal)   Pulse 71   Temp 98 F (36.7  C) (Oral)   Resp 18   Ht 5\' 6"  (1.676 m)   Wt 148 lb (67.1 kg)   LMP 09/21/2017   SpO2 99%   BMI 23.89 kg/m   BP/Weight 10/13/2017 08/30/2017 08/13/2017  Systolic BP 121 125 129  Diastolic BP 82 76 80  Wt. (Lbs) 148 146 146.4  BMI 23.89 23.57 23.63  Some encounter information is confidential and restricted. Go to Review Flowsheets activity to see all data.      Physical Exam  Constitutional: She is oriented to person, place, and time.  Well developed, well nourished, NAD, polite  HENT:   Head: Normocephalic and atraumatic.  Eyes: No scleral icterus.  Neck: Normal range of motion. Neck supple.  Cardiovascular: Normal rate, regular rhythm and normal heart sounds.  Pulmonary/Chest: Effort normal and breath sounds normal.  Musculoskeletal: She exhibits no edema.  Limited aROM of lower back with pain elicited in all planes of motion  Neurological: She is alert and oriented to person, place, and time. No cranial nerve deficit. Coordination normal.  Skin: Skin is warm and dry. No rash noted. No erythema. No pallor.  Psychiatric: Her behavior is normal. Thought content normal.  depressed  Vitals reviewed.    Assessment & Plan:    1. Lumbar herniated disc - Continue care with orthopedics. - Restart gabapentin (NEURONTIN) 300 MG capsule; Take 2 capsules (600 mg total) by mouth 3 (three) times daily.  Dispense: 180 capsule; Refill: 3 - Begin HYDROcodone-acetaminophen (NORCO) 5-325 MG tablet; Take 1 tablet by mouth at bedtime.  Dispense: 14 tablet; Refill: 0  2. Chronic bilateral low back pain with bilateral sciatica - Patient advised to continue care with orthopedics  3. Bipolar I disorder with depression (HCC) - Patient encouraged to continue care with her psychiatrist and to communicate frequently about her concerns with effectiveness of medications.    Meds ordered this encounter  Medications  . gabapentin (NEURONTIN) 300 MG capsule    Sig: Take 2 capsules (600 mg total) by mouth 3 (three) times daily.    Dispense:  180 capsule    Refill:  3    Order Specific Question:   Supervising Provider    Answer:   Quentin AngstJEGEDE, OLUGBEMIGA E [id number]  . HYDROcodone-acetaminophen (NORCO) 5-325 MG tablet    Sig: Take 1 tablet by mouth at bedtime.    Dispense:  14 tablet    Refill:  0    Order Specific Question:   Supervising Provider    Answer:   Quentin AngstJEGEDE, OLUGBEMIGA E [id number]    Follow-up: Return in about 3 months (around 01/10/2018) for PAP and HIV screening.   Loletta Specteroger David  Myisha Pickerel PA

## 2017-10-14 ENCOUNTER — Telehealth (INDEPENDENT_AMBULATORY_CARE_PROVIDER_SITE_OTHER): Payer: Self-pay | Admitting: Physician Assistant

## 2017-10-14 NOTE — Telephone Encounter (Signed)
[facility]  Ver[facility] if Mrs Christina [name] has a Chronic diagnosis to take Hydrocodone . Please, call her at  [phone]  Thank You

## 2017-10-15 NOTE — Telephone Encounter (Signed)
[facility] call back again still waiting for the caoll about the prescription, please follow up

## 2017-10-19 ENCOUNTER — Ambulatory Visit (INDEPENDENT_AMBULATORY_CARE_PROVIDER_SITE_OTHER): Payer: Self-pay | Admitting: Psychiatry

## 2017-10-19 ENCOUNTER — Encounter (HOSPITAL_COMMUNITY): Payer: Self-pay | Admitting: Psychiatry

## 2017-10-19 VITALS — BP 136/84 | HR 66 | Ht 66.0 in | Wt 154.4 lb

## 2017-10-19 DIAGNOSIS — F515 Nightmare disorder: Secondary | ICD-10-CM

## 2017-10-19 DIAGNOSIS — F4312 Post-traumatic stress disorder, chronic: Secondary | ICD-10-CM

## 2017-10-19 DIAGNOSIS — F3181 Bipolar II disorder: Secondary | ICD-10-CM

## 2017-10-19 DIAGNOSIS — F17210 Nicotine dependence, cigarettes, uncomplicated: Secondary | ICD-10-CM

## 2017-10-19 DIAGNOSIS — F332 Major depressive disorder, recurrent severe without psychotic features: Secondary | ICD-10-CM

## 2017-10-19 MED ORDER — CITALOPRAM HYDROBROMIDE 20 MG PO TABS: 20.0000 mg | ORAL_TABLET | Freq: Every day | ORAL | 1 refills | Status: DC

## 2017-10-19 MED ORDER — PRAZOSIN HCL 2 MG PO CAPS: 4.0000 mg | ORAL_CAPSULE | Freq: Every day | ORAL | 1 refills | Status: DC

## 2017-10-19 MED FILL — AMLODIPINE BESYLATE 10 MG T: 10 | 30 days supply | Qty: 30 | Fill #1

## 2017-10-19 MED FILL — PRAZOSIN 2 MG CAPSULE: 2 | 30 days supply | Qty: 60 | Fill #2

## 2017-10-19 MED FILL — TOPIRAMATE 25 MG TABLET: 25 | 30 days supply | Qty: 60 | Fill #1

## 2017-10-19 MED FILL — HYDROCODON-APAP 5-325: 5-325 | 14 days supply | Qty: 14 | Fill #0

## 2017-10-19 MED FILL — ESCITALOPRAM 20 MG TABLET: 20 | 30 days supply | Qty: 30 | Fill #0
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
## 2017-10-26 NOTE — Telephone Encounter (Signed)
Please advise. Tempestt S Roberts, CMA  

## 2017-10-26 NOTE — Telephone Encounter (Signed)
Ms. Christina [name] does have a chronic condition for which she is taking Hydrocodone for, it is in her medical records. Please call them back and tell them. Thanks.

## 2017-10-26 NOTE — Telephone Encounter (Signed)
Please clarify with PCP the chronic condition the patient is taking hydrocodone for.

## 2017-10-27 NOTE — Telephone Encounter (Signed)
Called [facility] and spoke with pharmacist, he stated patient switched all prescriptions from their location so they do not have any medications on file for her anymore. Christina [name], CMA

## 2017-10-28 ENCOUNTER — Ambulatory Visit (INDEPENDENT_AMBULATORY_CARE_PROVIDER_SITE_OTHER): Payer: Self-pay | Admitting: Orthopaedic Surgery

## 2017-11-03 ENCOUNTER — Ambulatory Visit (HOSPITAL_COMMUNITY): Payer: Self-pay | Admitting: Licensed Clinical Social Worker

## 2017-11-16 ENCOUNTER — Other Ambulatory Visit (INDEPENDENT_AMBULATORY_CARE_PROVIDER_SITE_OTHER): Payer: Self-pay | Admitting: Physician Assistant

## 2017-11-16 DIAGNOSIS — R519 Headache, unspecified: Secondary | ICD-10-CM

## 2017-11-16 DIAGNOSIS — R51 Headache: Principal | ICD-10-CM

## 2017-11-16 NOTE — Telephone Encounter (Signed)
FWD to PCP. Christina [name], CMA  

## 2017-11-18 ENCOUNTER — Ambulatory Visit (INDEPENDENT_AMBULATORY_CARE_PROVIDER_SITE_OTHER): Payer: Self-pay | Admitting: Orthopaedic Surgery

## 2017-11-18 ENCOUNTER — Encounter (INDEPENDENT_AMBULATORY_CARE_PROVIDER_SITE_OTHER): Payer: Self-pay | Admitting: Orthopaedic Surgery

## 2017-11-18 VITALS — BP 121/76 | HR 70 | Ht 66.0 in | Wt 148.0 lb

## 2017-11-18 DIAGNOSIS — G8929 Other chronic pain: Secondary | ICD-10-CM

## 2017-11-18 DIAGNOSIS — I10 Essential (primary) hypertension: Secondary | ICD-10-CM

## 2017-11-18 DIAGNOSIS — M545 Low back pain: Secondary | ICD-10-CM

## 2017-11-24 MED FILL — GABAPENTIN 300 MG CAPSULE: 300 | 30 days supply | Qty: 180 | Fill #1

## 2017-11-24 MED FILL — TOPIRAMATE 25 MG TABLET: 25 | 30 days supply | Qty: 60 | Fill #0

## 2017-11-24 MED FILL — ?CITALOPRAM HBR 20 MG TABLE: 20 | 30 days supply | Qty: 30 | Fill #0

## 2017-11-25 MED FILL — $Latuda 60mg tablet: 60 | 30 days supply | Qty: 30 | Fill #2

## 2017-11-25 MED FILL — AMLODIPINE BESYLATE 10 MG T: 10 | 30 days supply | Qty: 30 | Fill #2

## 2017-11-25 MED FILL — ?PRAZOSIN 2 MG CAPSULE: 2 | 30 days supply | Qty: 60 | Fill #3

## 2017-11-25 MED FILL — XARELTO 20 MG TABLET: 20 | 30 days supply | Qty: 30 | Fill #2

## 2017-11-25 MED FILL — ?CARVEDILOL 12.5 MG TABLET: 12.5 | 30 days supply | Qty: 60 | Fill #1

## 2017-11-27 ENCOUNTER — Ambulatory Visit (HOSPITAL_COMMUNITY): Payer: Self-pay | Admitting: Licensed Clinical Social Worker

## 2017-11-29 ENCOUNTER — Encounter (INDEPENDENT_AMBULATORY_CARE_PROVIDER_SITE_OTHER): Payer: Self-pay | Admitting: Orthopaedic Surgery
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
## 2017-12-16 ENCOUNTER — Ambulatory Visit (HOSPITAL_COMMUNITY): Payer: Self-pay | Admitting: Psychiatry

## 2017-12-22 ENCOUNTER — Ambulatory Visit (INDEPENDENT_AMBULATORY_CARE_PROVIDER_SITE_OTHER): Payer: Self-pay | Admitting: Psychiatry

## 2017-12-22 ENCOUNTER — Encounter (HOSPITAL_COMMUNITY): Payer: Self-pay | Admitting: Psychiatry

## 2017-12-22 VITALS — BP 109/75 | HR 68 | Ht 66.0 in | Wt 155.0 lb

## 2017-12-22 DIAGNOSIS — F3181 Bipolar II disorder: Secondary | ICD-10-CM

## 2017-12-22 DIAGNOSIS — F515 Nightmare disorder: Secondary | ICD-10-CM

## 2017-12-22 DIAGNOSIS — Z79899 Other long term (current) drug therapy: Secondary | ICD-10-CM

## 2017-12-22 DIAGNOSIS — Z634 Disappearance and death of family member: Secondary | ICD-10-CM

## 2017-12-22 DIAGNOSIS — F4312 Post-traumatic stress disorder, chronic: Secondary | ICD-10-CM

## 2017-12-22 DIAGNOSIS — F17200 Nicotine dependence, unspecified, uncomplicated: Secondary | ICD-10-CM

## 2017-12-22 MED ORDER — ESCITALOPRAM OXALATE 20 MG PO TABS: 20.0000 mg | ORAL_TABLET | Freq: Every day | ORAL | 0 refills | Status: DC

## 2017-12-22 MED ORDER — QUETIAPINE FUMARATE 100 MG PO TABS: 100.0000 mg | ORAL_TABLET | Freq: Every day | ORAL | 0 refills | Status: DC

## 2017-12-22 MED FILL — QUETIAPINE FUMARATE 100 MG: 100 | 30 days supply | Qty: 30 | Fill #0

## 2017-12-22 MED FILL — ESCITALOPRAM 20 MG TABLET: 20 | 30 days supply | Qty: 30 | Fill #0

## 2017-12-22 NOTE — Patient Instructions (Addendum)
STOP Latuda   STOP prazosin   START Seroquel 100 mg nightly at bedtime CONTINUE Lexapro 20 mg in the morning
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
## 2018-01-13 ENCOUNTER — Encounter (HOSPITAL_COMMUNITY): Payer: Self-pay | Admitting: Emergency Medicine

## 2018-01-13 ENCOUNTER — Ambulatory Visit (HOSPITAL_COMMUNITY)
Admission: EM | Admit: 2018-01-13 | Discharge: 2018-01-13 | Disposition: A | Discharge status: Home / Self Care | Payer: Self-pay | Attending: Family Medicine | Admitting: Family Medicine

## 2018-01-13 DIAGNOSIS — R350 Frequency of micturition: Secondary | ICD-10-CM | POA: Insufficient documentation

## 2018-01-13 DIAGNOSIS — F17210 Nicotine dependence, cigarettes, uncomplicated: Secondary | ICD-10-CM | POA: Insufficient documentation

## 2018-01-13 DIAGNOSIS — I10 Essential (primary) hypertension: Secondary | ICD-10-CM | POA: Insufficient documentation

## 2018-01-13 DIAGNOSIS — F419 Anxiety disorder, unspecified: Secondary | ICD-10-CM | POA: Insufficient documentation

## 2018-01-13 DIAGNOSIS — Z7901 Long term (current) use of anticoagulants: Secondary | ICD-10-CM | POA: Insufficient documentation

## 2018-01-13 DIAGNOSIS — Z86718 Personal history of other venous thrombosis and embolism: Secondary | ICD-10-CM | POA: Insufficient documentation

## 2018-01-13 DIAGNOSIS — M5126 Other intervertebral disc displacement, lumbar region: Secondary | ICD-10-CM | POA: Insufficient documentation

## 2018-01-13 DIAGNOSIS — G8929 Other chronic pain: Secondary | ICD-10-CM | POA: Insufficient documentation

## 2018-01-13 DIAGNOSIS — F329 Major depressive disorder, single episode, unspecified: Secondary | ICD-10-CM | POA: Insufficient documentation

## 2018-01-13 DIAGNOSIS — Z79899 Other long term (current) drug therapy: Secondary | ICD-10-CM | POA: Insufficient documentation

## 2018-01-13 DIAGNOSIS — G4700 Insomnia, unspecified: Secondary | ICD-10-CM | POA: Insufficient documentation

## 2018-01-13 DIAGNOSIS — R3915 Urgency of urination: Secondary | ICD-10-CM | POA: Insufficient documentation

## 2018-01-13 DIAGNOSIS — G43009 Migraine without aura, not intractable, without status migrainosus: Secondary | ICD-10-CM | POA: Insufficient documentation

## 2018-01-13 DIAGNOSIS — M5441 Lumbago with sciatica, right side: Secondary | ICD-10-CM | POA: Insufficient documentation

## 2018-01-13 LAB — POCT URINALYSIS DIP (DEVICE)
Bilirubin Urine: NEGATIVE
GLUCOSE, UA: NEGATIVE mg/dL
HGB URINE DIPSTICK: NEGATIVE
Ketones, ur: NEGATIVE mg/dL
Nitrite: NEGATIVE
PROTEIN: NEGATIVE mg/dL
SPECIFIC GRAVITY, URINE: 1.02 (ref 1.005–1.030)
UROBILINOGEN UA: 0.2 mg/dL (ref 0.0–1.0)
pH: 6 (ref 5.0–8.0)

## 2018-01-13 MED ORDER — NITROFURANTOIN MONOHYD MACRO 100 MG PO CAPS: 100.0000 mg | ORAL_CAPSULE | Freq: Two times a day (BID) | ORAL | 0 refills | Status: DC

## 2018-01-13 MED FILL — NITROFURANTOIN MONO-MCR 100: 100 | 5 days supply | Qty: 10 | Fill #0

## 2018-01-13 NOTE — ED Provider Notes (Signed)
[facility]    CSN: [id number] Arrival date & time: 01/13/18  1323     History   Chief Complaint Chief Complaint  Patient presents with  . Urinary Frequency    HPI Christina [name] is a 47 y.o. female no contributing past medical history presenting today for evaluation of urinary urgency and suprapubic pain/pressure.  Symptoms have been going on for 3 days.  Denies any vaginal discharge.  Denies dysuria.  Denies flank pain.  Denies fever, nausea, vomiting.  Last menstrual period was 2 weeks ago.  Not on any form of birth control.  Patient does smoke approximately 1/4 to 1/2 pack a day.  Does drink coffee, but tries to use decaf.  Denies history of diabetes.  HPI  Past Medical History:  Diagnosis Date  . Anxiety   . Back pain   . DVT (deep venous thrombosis) (HCC)    left leg with stent    Patient Active Problem List   Diagnosis Date Noted  . Lumbar herniated disc 09/30/2017  . Hypertension 05/22/2017  . Migraine without aura and with status migrainosus, not intractable 05/22/2017  . Tachycardia 05/22/2017  . Insomnia 03/04/2017  . Chronic bilateral low back pain with bilateral sciatica 01/26/2017  . Depression with anxiety 01/26/2017  . DVT (deep venous thrombosis) (HCC) 11/03/2016    Past Surgical History:  Procedure Laterality Date  . INTRAVASCULAR ULTRASOUND/IVUS Left 11/03/2016   Procedure: Intravascular Ultrasound/IVUS;  Surgeon: Maeola Harman, MD;  Location: [facility] INVASIVE CV LAB;  Service: Cardiovascular;  Laterality: Left;  . LOWER EXTREMITY VENOGRAPHY Left 11/04/2016   Procedure: Lower Extremity Venography;  Surgeon: Nada Libman, MD;  Location: [facility] INVASIVE CV LAB;  Service: Cardiovascular;  Laterality: Left;  . PERIPHERAL VASCULAR INTERVENTION Left 11/04/2016   Procedure: Peripheral Vascular Intervention;  Surgeon: Nada Libman, MD;  Location: [facility] INVASIVE CV LAB;  Service: Cardiovascular;  Laterality: Left;  common external  . PERIPHERAL  VASCULAR THROMBECTOMY Left 11/03/2016   Procedure: Peripheral Vascular Thrombectomy;  Surgeon: Maeola Harman, MD;  Location: [facility] INVASIVE CV LAB;  Service: Cardiovascular;  Laterality: Left;    OB History   None      Home Medications    Prior to Admission medications   Medication Sig Start Date End Date Taking? Authorizing Provider  amLODipine (NORVASC) 10 MG tablet Take 1 tablet (10 mg total) by mouth daily. 05/22/17   Loletta Specter, PA-C  carvedilol (COREG) 12.5 MG tablet Take 1 tablet (12.5 mg total) by mouth 2 (two) times daily with a meal. 05/22/17   Loletta Specter, PA-C  escitalopram (LEXAPRO) 20 MG tablet Take 1 tablet (20 mg total) by mouth daily. 12/22/17   Eksir, Bo Mcclintock, MD  gabapentin (NEURONTIN) 300 MG capsule Take 2 capsules (600 mg total) by mouth 3 (three) times daily. 10/13/17   Loletta Specter, PA-C  HYDROcodone-acetaminophen [facility]) 5-325 MG tablet Take 1 tablet by mouth at bedtime. 10/13/17   Loletta Specter, PA-C  nitrofurantoin, macrocrystal-monohydrate, (MACROBID) 100 MG capsule Take 1 capsule (100 mg total) by mouth 2 (two) times daily. 01/13/18   Ahmadou Bolz C, PA-C  QUEtiapine (SEROQUEL) 100 MG tablet Take 1 tablet (100 mg total) by mouth at bedtime. 12/22/17 12/22/18  Burnard Leigh, MD  rivaroxaban (XARELTO) 20 MG TABS tablet Take 1 tablet (20 mg total) by mouth daily with supper. 03/27/17   Loletta Specter, PA-C  SUMAtriptan (IMITREX) 25 MG tablet Take 1 tablet (25 mg total) by mouth  daily. May take one more tablet two hours after the first. No more than two tablets per day. 05/22/17   Loletta Specter, PA-C  topiramate (TOPAMAX) 25 MG tablet TAKE 1 TABLET BY MOUTH 2 TIMES DAILY. 11/16/17   Loletta Specter, PA-C    Family History No family history on file.  Social History Social History   Tobacco Use  . Smoking status: Current Every Day Smoker    Packs/day: 0.25  . Smokeless tobacco: Never Used  Substance Use  Topics  . Alcohol use: No  . Drug use: No     Allergies   Patient has no known allergies.   Review of Systems Review of Systems  Constitutional: Negative for fever.  Respiratory: Negative for shortness of breath.   Cardiovascular: Negative for chest pain.  Gastrointestinal: Negative for abdominal pain, diarrhea, nausea and vomiting.  Genitourinary: Positive for frequency, pelvic pain and urgency. Negative for dysuria, flank pain, genital sores, hematuria, menstrual problem, vaginal bleeding, vaginal discharge and vaginal pain.  Musculoskeletal: Negative for back pain.  Skin: Negative for rash.  Neurological: Negative for dizziness, light-headedness and headaches.     Physical Exam Triage Vital Signs ED Triage Vitals [01/13/18 1423]  Enc Vitals Group     BP (!) 152/99     Pulse Rate 99     Resp 18     Temp 98.6 F (37 C)     Temp src      SpO2 100 %     Weight      Height      Head Circumference      Peak Flow      Pain Score      Pain Loc      Pain Edu?      Excl. in GC?    No data found.  Updated Vital Signs BP (!) 152/99   Pulse 99   Temp 98.6 F (37 C)   Resp 18   LMP 12/30/2017   SpO2 100%   Visual Acuity Right Eye Distance:   Left Eye Distance:   Bilateral Distance:    Right Eye Near:   Left Eye Near:    Bilateral Near:     Physical Exam  Constitutional: She appears well-developed and well-nourished. No distress.  HENT:  Head: Normocephalic and atraumatic.  Eyes: Conjunctivae are normal.  Neck: Neck supple.  Cardiovascular: Normal rate and regular rhythm.  No murmur heard. Pulmonary/Chest: Effort normal and breath sounds normal. No respiratory distress.  Abdominal: Soft. There is no tenderness.  Tenderness over suprapubic area, negative CVA tenderness  Musculoskeletal: She exhibits no edema.  Neurological: She is alert.  Skin: Skin is warm and dry.  Psychiatric: She has a normal mood and affect.  Nursing note and vitals  reviewed.    UC Treatments / Results  Labs (all labs ordered are listed, but only abnormal results are displayed) Labs Reviewed  POCT URINALYSIS DIP (DEVICE) - Abnormal; Notable for the following components:      Result Value   Leukocytes, UA SMALL (*)    All other components within normal limits  URINE CULTURE    EKG None  Radiology No results found.  Procedures Procedures (including critical care time)  Medications Ordered in UC Medications - No data to display  Initial Impression / Assessment and Plan / UC Course  I have reviewed the triage vital signs and the nursing notes.  Pertinent labs & imaging results that were available during my care of the  patient were reviewed by me and considered in my medical decision making (see chart for details).     Small leuks on UA, negative nitrites.  We will send off for culture to confirm UTI.  We will go and initiate antibiotics with Macrobid.  Vital signs stable, no fever, nausea, vomiting, back pain.  Unlikely Pyelo. Discussed strict return precautions. Patient verbalized understanding and is agreeable with plan.  Final Clinical Impressions(s) / UC Diagnoses   Final diagnoses:  Urinary urgency     Discharge Instructions     Urine showed evidence of infection. We are treating you with macrobid. Be sure to take full course. Stay hydrated- urine should be pale yellow to clear. My continue azo for relief of burning while infection is being cleared.   Please return or follow up with your primary provider if symptoms not improving with treatment. Please return sooner if you have worsening of symptoms or develop fever, nausea, vomiting, abdominal pain, back pain, lightheadedness, dizziness.   ED Prescriptions    Medication Sig Dispense Auth. Provider   nitrofurantoin, macrocrystal-monohydrate, (MACROBID) 100 MG capsule Take 1 capsule (100 mg total) by mouth 2 (two) times daily. 10 capsule Indianna Boran C, PA-C      Controlled Substance Prescriptions  Controlled Substance Registry consulted? Not Applicable   Lew Dawes, [place] 01/13/18 2357

## 2018-01-13 NOTE — ED Triage Notes (Signed)
Pt c/o urinary urgency x3 days.

## 2018-01-13 NOTE — Discharge Instructions (Signed)
Urine showed evidence of infection. We are treating you with macrobid. Be sure to take full course. Stay hydrated- urine should be pale yellow to clear. My continue azo for relief of burning while infection is being cleared.  ° °Please return or follow up with your primary provider if symptoms not improving with treatment. Please return sooner if you have worsening of symptoms or develop fever, nausea, vomiting, abdominal pain, back pain, lightheadedness, dizziness. °

## 2018-01-15 LAB — URINE CULTURE: Culture: 80000 — AB

## 2018-01-16 ENCOUNTER — Telehealth (HOSPITAL_COMMUNITY): Payer: Self-pay

## 2018-01-16 NOTE — Telephone Encounter (Signed)
Urine culture was positive for E.coli and was given Macrobid at urgent care visit. Attempted to reach patient. No answer at this time. Voicemail left.

## 2018-01-18 ENCOUNTER — Ambulatory Visit (HOSPITAL_COMMUNITY): Payer: Self-pay | Admitting: Licensed Clinical Social Worker

## 2018-01-29 ENCOUNTER — Ambulatory Visit (INDEPENDENT_AMBULATORY_CARE_PROVIDER_SITE_OTHER): Payer: Self-pay | Admitting: Licensed Clinical Social Worker

## 2018-01-29 DIAGNOSIS — F4312 Post-traumatic stress disorder, chronic: Secondary | ICD-10-CM

## 2018-01-29 NOTE — Progress Notes (Signed)
Pt did not show for initial appointment.  Dorann LodgeWes Aeneas Longsworth, LPCA

## 2018-02-02 DIAGNOSIS — I10 Essential (primary) hypertension: Secondary | ICD-10-CM

## 2018-02-03 ENCOUNTER — Ambulatory Visit (INDEPENDENT_AMBULATORY_CARE_PROVIDER_SITE_OTHER): Payer: Self-pay | Admitting: Physician Assistant

## 2018-02-03 MED FILL — ESCITALOPRAM 20 MG TABLET: 20 | 30 days supply | Qty: 30 | Fill #1

## 2018-02-03 MED FILL — $Latuda 60mg tablet: 60 | 30 days supply | Qty: 30 | Fill #3

## 2018-02-03 MED FILL — TOPIRAMATE 25 MG TABS: 25 | 30 days supply | Qty: 60 | Fill #1

## 2018-02-03 MED FILL — QUETIAPINE FUMARATE 100 MG: 100 | 30 days supply | Qty: 30 | Fill #1

## 2018-02-05 MED FILL — GABAPENTIN 300 MG CAPSULE: 300 | 30 days supply | Qty: 180 | Fill #2

## 2018-02-08 ENCOUNTER — Ambulatory Visit (INDEPENDENT_AMBULATORY_CARE_PROVIDER_SITE_OTHER): Payer: Self-pay | Admitting: Psychiatry

## 2018-02-08 ENCOUNTER — Encounter (HOSPITAL_COMMUNITY): Payer: Self-pay | Admitting: Psychiatry

## 2018-02-08 VITALS — BP 152/80 | HR 68 | Ht 66.0 in | Wt 154.0 lb

## 2018-02-08 DIAGNOSIS — M5441 Lumbago with sciatica, right side: Secondary | ICD-10-CM

## 2018-02-08 DIAGNOSIS — M5126 Other intervertebral disc displacement, lumbar region: Secondary | ICD-10-CM

## 2018-02-08 DIAGNOSIS — Z79899 Other long term (current) drug therapy: Secondary | ICD-10-CM

## 2018-02-08 DIAGNOSIS — F3181 Bipolar II disorder: Secondary | ICD-10-CM

## 2018-02-08 DIAGNOSIS — F17200 Nicotine dependence, unspecified, uncomplicated: Secondary | ICD-10-CM

## 2018-02-08 DIAGNOSIS — F515 Nightmare disorder: Secondary | ICD-10-CM

## 2018-02-08 DIAGNOSIS — F4312 Post-traumatic stress disorder, chronic: Secondary | ICD-10-CM

## 2018-02-08 DIAGNOSIS — M5442 Lumbago with sciatica, left side: Secondary | ICD-10-CM

## 2018-02-08 DIAGNOSIS — G8929 Other chronic pain: Secondary | ICD-10-CM

## 2018-02-08 MED ORDER — QUETIAPINE FUMARATE 200 MG PO TABS: 200.0000 mg | ORAL_TABLET | Freq: Every day | ORAL | 0 refills | Status: DC

## 2018-02-08 NOTE — Progress Notes (Signed)
BH MD/PA/NP OP Progress Note  02/08/2018 4:17 PM Christina [name]  MRN:  [id number]  Chief Complaint: med check, anxiety, depression  HPI: Christina [name] continues with depressed mood in the setting of chronic back pain, and feeling debilitated.  She would like to be able to work but feels like her physical health symptoms get in the way of this.  I spent time with her encouraging her to participate in physical therapy and she was receptive to this, I provided a referral.    We agreed to increase Seroquel to 200 mg given her ongoing insomnia and poor appetite.  She continues on Lexapro 20 mg daily for her main SSRI.  We agreed to follow-up in 4-5 weeks.  Notably, she has felt to a motivated and difficult to get around with her pain so she missed her therapy and takes at this office.  We will revisit in the future if she is interested.  Visit Diagnosis:    ICD-10-CM   1. Lumbar herniated disc M51.26 Ambulatory referral to Physical Therapy  2. Chronic post-traumatic stress disorder (PTSD) F43.12 QUEtiapine (SEROQUEL) 200 MG tablet  3. Nightmares F51.5 QUEtiapine (SEROQUEL) 200 MG tablet  4. Bipolar 2 disorder, major depressive episode (HCC) F31.81 QUEtiapine (SEROQUEL) 200 MG tablet  5. 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 Reviewed, with no updates at this time.  Past Medical History:  Past Medical History:  Diagnosis Date  . Anxiety   . Back pain   . 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 INTRAVASCULAR ULTRASOUND/IVUS Left 11/03/2016   Procedure: Intravascular Ultrasound/IVUS;  Surgeon: Maeola Harman, MD;  Location: [facility] INVASIVE CV LAB;  Service: Cardiovascular;  Laterality: Left;  . LOWER EXTREMITY VENOGRAPHY Left 11/04/2016   Procedure: Lower Extremity  Venography;  Surgeon: Nada Libman, MD;  Location: [facility] INVASIVE CV LAB;  Service: Cardiovascular;  Laterality: Left;  . PERIPHERAL VASCULAR INTERVENTION Left 11/04/2016   Procedure: Peripheral Vascular Intervention;  Surgeon: Nada Libman, MD;  Location: [facility] INVASIVE CV LAB;  Service: Cardiovascular;  Laterality: Left;  common external  . PERIPHERAL VASCULAR THROMBECTOMY Left 11/03/2016   Procedure: Peripheral Vascular Thrombectomy;  Surgeon: Maeola Harman, MD;  Location: [facility] INVASIVE CV LAB;  Service: Cardiovascular;  Laterality: Left;    Family Psychiatric History: See intake H&P for full details. Reviewed, with no updates at this time.   Family History: No family history on file.  Social History:  Social History   Socioeconomic History  . Marital status: Divorced    Spouse name: Not on file  . Number of children: Not on file  . Years of education: Not on file  . Highest education level: Not on file  Occupational History  . Not on file  Social Needs  . Financial resource strain: Very hard  . Food insecurity:    Worry: Sometimes true    Inability: Sometimes true  . Transportation needs:    Medical: No    Non-medical: No  Tobacco Use  . Smoking status: Current Every Day Smoker    Packs/day: 0.25  . Smokeless tobacco: Never Used  Substance and Sexual Activity  . Alcohol use: No  . Drug use: No  . Sexual activity: Yes    Birth control/protection: Other-see  comments    Comment: Tubal ligation  Lifestyle  . Physical activity:    Days per week: 2 days    Minutes per session: 10 min  . Stress: Very much  Relationships  . Social connections:    Talks on phone: Three times a week    Gets together: More than three times a week    Attends religious service: Never    Active member of club or organization: No    Attends meetings of clubs or organizations: Never    Relationship status: Divorced  Other Topics Concern  . Not on file  Social History Narrative  .  Not on file    Allergies: No Known Allergies  Metabolic Disorder Labs: No results found for: HGBA1C, MPG No results found for: PROLACTIN No results found for: CHOL, TRIG, HDL, CHOLHDL, VLDL, LDLCALC Lab Results  Component Value Date   TSH 0.567 11/11/2016    Therapeutic Level Labs: No results found for: LITHIUM No results found for: VALPROATE No components found for:  CBMZ  Current Medications: Current Outpatient Medications  Medication Sig Dispense Refill  . amLODipine (NORVASC) 10 MG tablet Take 1 tablet (10 mg total) by mouth daily. 90 tablet 1  . carvedilol (COREG) 12.5 MG tablet Take 1 tablet (12.5 mg total) by mouth 2 (two) times daily with a meal. 90 tablet 1  . escitalopram (LEXAPRO) 20 MG tablet Take 1 tablet (20 mg total) by mouth daily. 90 tablet 0  . gabapentin (NEURONTIN) 300 MG capsule Take 2 capsules (600 mg total) by mouth 3 (three) times daily. 180 capsule 3  . HYDROcodone-acetaminophen (NORCO) 5-325 MG tablet Take 1 tablet by mouth at bedtime. 14 tablet 0  . nitrofurantoin, macrocrystal-monohydrate, (MACROBID) 100 MG capsule Take 1 capsule (100 mg total) by mouth 2 (two) times daily. 10 capsule 0  . QUEtiapine (SEROQUEL) 200 MG tablet Take 1 tablet (200 mg total) by mouth at bedtime. 90 tablet 0  . rivaroxaban (XARELTO) 20 MG TABS tablet Take 1 tablet (20 mg total) by mouth daily with supper. 90 tablet 3  . SUMAtriptan (IMITREX) 25 MG tablet Take 1 tablet (25 mg total) by mouth daily. May take one more tablet two hours after the first. No more than two tablets per day. 9 tablet 1  . topiramate (TOPAMAX) 25 MG tablet TAKE 1 TABLET BY MOUTH 2 TIMES DAILY. 60 tablet 1   No current facility-administered medications for this visit.      Musculoskeletal: Strength & Muscle Tone: within normal limits Gait & Station: normal Patient leans: N/A  Psychiatric Specialty Exam: ROS  Blood pressure (!) 152/80, pulse 68, height 5\' 6"  (1.676 m), weight 154 lb (69.9 kg).Body  mass index is 24.86 kg/m.  General Appearance: Casual and Fairly Groomed  Eye Contact:  Fair  Speech:  Clear and Coherent and Normal Rate  Volume:  Normal  Mood:  Dysphoric, Irritable and frustrated with my back  Affect:  Congruent  Thought Process:  Goal Directed and Descriptions of Associations: Intact  Orientation:  Full (Time, Place, and Person)  Thought Content: Logical   Suicidal Thoughts:  No  Homicidal Thoughts:  No  Memory:  Immediate;   Good  Judgement:  Fair  Insight:  Good and Fair  Psychomotor Activity:  Normal  Concentration:  Attention Span: Fair  Recall:  Fis[employer]Fair  Fund of Knowledge: Fair  Language: Fair  Akathisia:  Negative  Handed:  Right  AIMS (if indicated): not done  Assets:  Communication Skills Desire  for Improvement Financial Resources/Insurance Housing  ADL's:  Intact  Cognition: WNL  Sleep:  Fair   Screenings: GAD-7     Office Visit from 10/13/2017 in [facility] Office Visit from 08/13/2017 in [facility] Office Visit from 11/11/2016 in [facility]  Total GAD-7 Score  20  19  20     PHQ2-9     Office Visit from 10/13/2017 in [facility] Office Visit from 08/13/2017 in [facility] Office Visit from 05/22/2017 in [facility] Office Visit from 03/27/2017 in [facility] Office Visit from 03/04/2017 in [facility]  PHQ-2 Total Score  6  4  0  0  0  PHQ-9 Total Score  24  18  -  -  -      Assessment and Plan: Christina [name] presents with ongoing depression, some relief of her grief symptoms, but predominantly focused on her poor mobility and chronic lower back pain.  She is receptive to participation in physical therapy and I have provided her a referral and assessment for TENS unit.  No acute safety issues at this time.  We have agreed to increase Seroquel to 200 mg nightly with  continued Lexapro 20 mg.  1. Lumbar herniated disc   2. Chronic post-traumatic stress disorder (PTSD)   3. Nightmares   4. Bipolar 2 disorder, major depressive episode (HCC)   5. Chronic bilateral low back pain with bilateral sciatica     Status of current problems: unchanged  Labs Ordered: Orders Placed This Encounter  Procedures  . Ambulatory referral to Physical Therapy    Referral Priority:   Routine    Referral Type:   Physical Medicine    Referral Reason:   Specialty Services Required    Requested Specialty:   Physical Therapy    Number of Visits Requested:   1    Labs Reviewed: n/a  Collateral Obtained/Records Reviewed: n/a  Plan:  Seroquel 200 mg nightly for sleep and augmentation of SSRI Continue Lexapro 20 mg Referral for physical therapy   Burnard Leigh, MD 02/08/2018, 4:17 PM

## 2018-03-23 ENCOUNTER — Ambulatory Visit (HOSPITAL_COMMUNITY): Payer: Self-pay | Admitting: Psychiatry

## 2018-04-02 ENCOUNTER — Ambulatory Visit: Payer: Self-pay

## 2018-04-07 ENCOUNTER — Ambulatory Visit: Payer: Self-pay

## 2018-04-28 ENCOUNTER — Ambulatory Visit: Payer: Self-pay | Attending: Family Medicine

## 2018-06-10 DIAGNOSIS — I10 Essential (primary) hypertension: Secondary | ICD-10-CM

## 2018-09-08 IMAGING — CR DG HIP (WITH OR WITHOUT PELVIS) 2-3V*R*
3 series · 3 of 3 positions shown · non-contrast
Comparison: None.

CLINICAL DATA: Chronic lower back pain. Pain radiating to right
hip.

EXAM:
DG HIP (WITH OR WITHOUT PELVIS) 2-3V RIGHT

[pelvis ap]
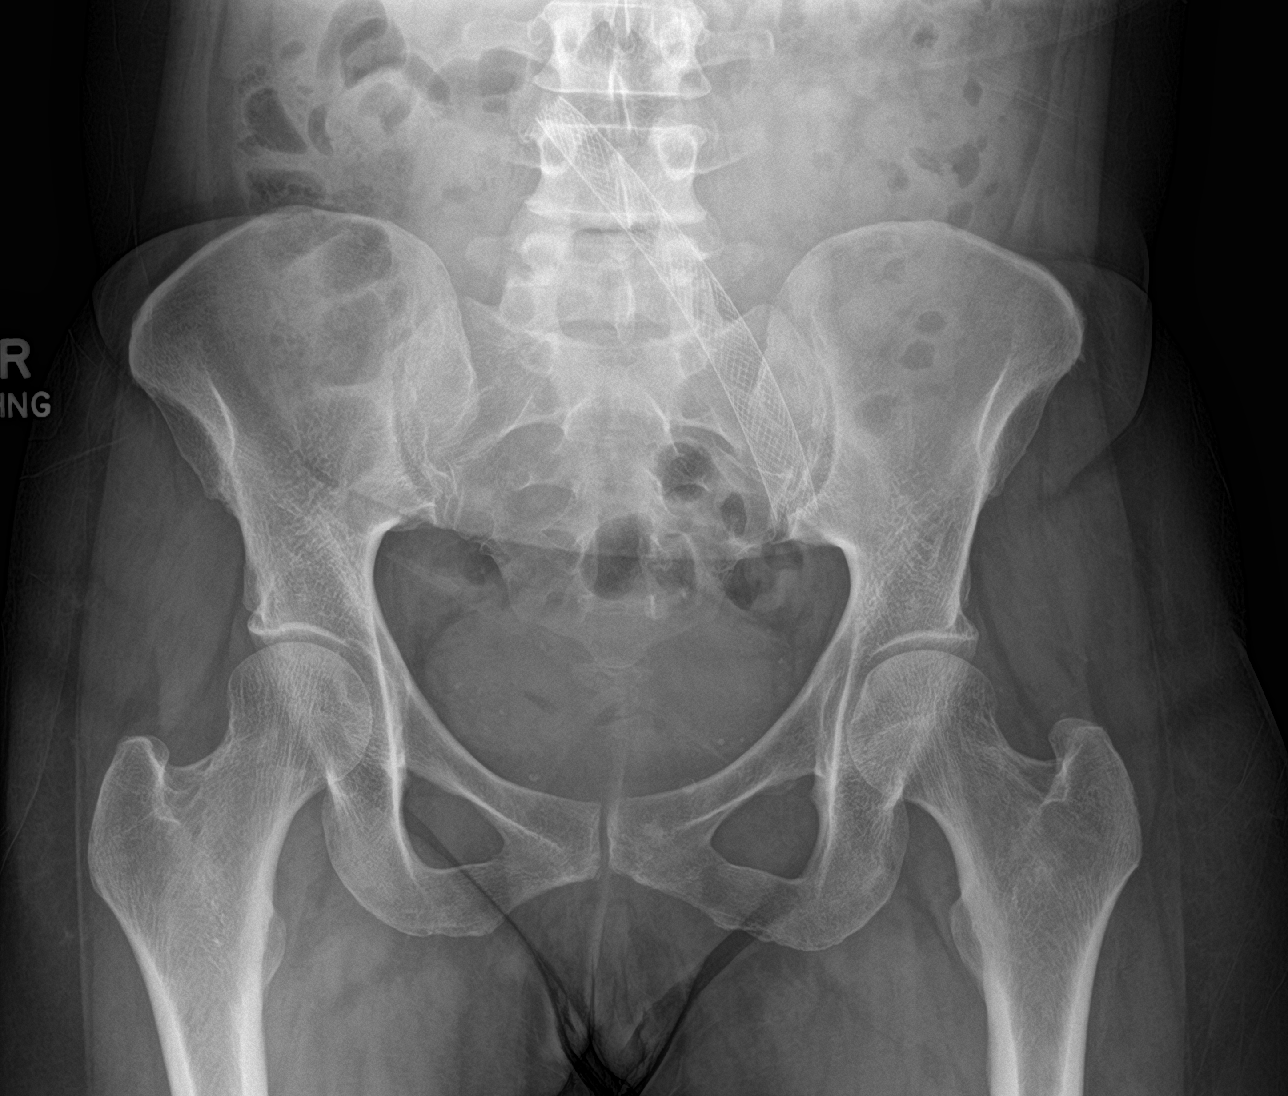

[hip ap]
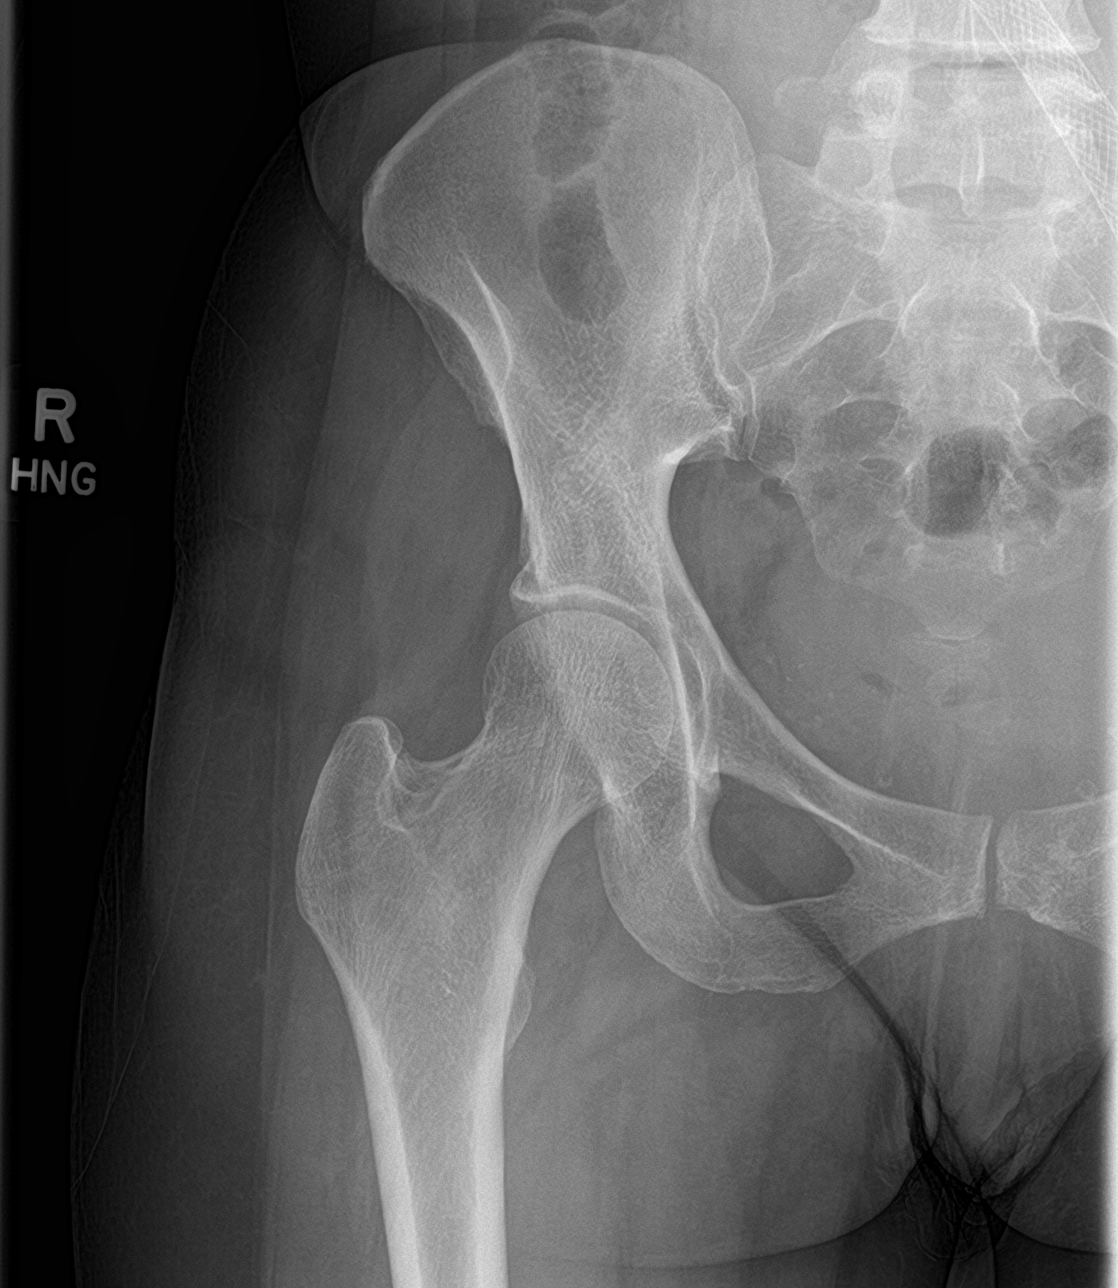

[hip lat]
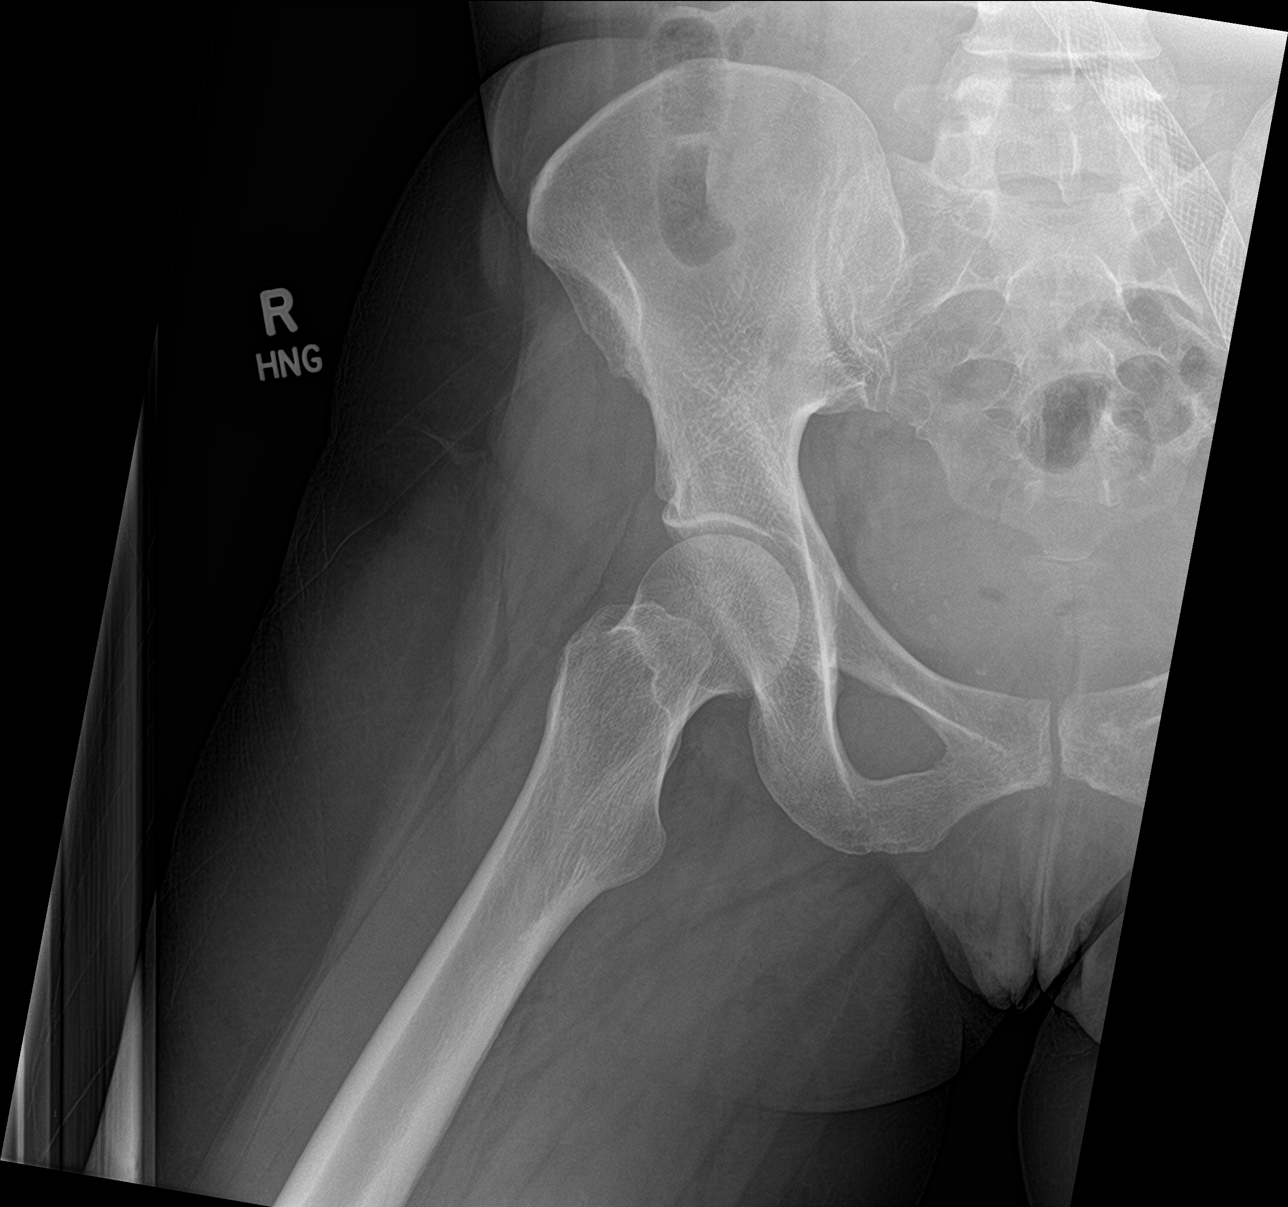

[3 of 3 positions shown; findings below may reference images not displayed]

FINDINGS: There is a vascular stent in a left iliac vessel. The lower lumbar
spine is unremarkable as is the sacrum. The iliac bones are normal.
No fractures or dislocations. No significant degenerative changes in
the right hip.
IMPRESSION: No cause for right hip pain identified.

## 2018-09-22 ENCOUNTER — Other Ambulatory Visit: Payer: Self-pay

## 2018-09-22 ENCOUNTER — Inpatient Hospital Stay (HOSPITAL_COMMUNITY)
Admission: AD | Admit: 2018-09-22 | Discharge: 2018-10-06 | DRG: 885 | Disposition: A | Discharge status: Home / Self Care | Payer: Medicaid Other | Source: Intra-hospital | Attending: Psychiatry | Admitting: Psychiatry

## 2018-09-22 ENCOUNTER — Emergency Department (HOSPITAL_COMMUNITY)
Admission: EM | Admit: 2018-09-22 | Discharge: 2018-09-22 | Disposition: A | Discharge status: Other Acute Inpatient Hospital | Payer: Self-pay | Attending: Emergency Medicine | Admitting: Emergency Medicine

## 2018-09-22 ENCOUNTER — Encounter (HOSPITAL_COMMUNITY): Payer: Self-pay | Admitting: *Deleted

## 2018-09-22 ENCOUNTER — Encounter (HOSPITAL_COMMUNITY): Payer: Self-pay | Admitting: Emergency Medicine

## 2018-09-22 DIAGNOSIS — F419 Anxiety disorder, unspecified: Secondary | ICD-10-CM | POA: Diagnosis present

## 2018-09-22 DIAGNOSIS — Z560 Unemployment, unspecified: Secondary | ICD-10-CM | POA: Diagnosis not present

## 2018-09-22 DIAGNOSIS — Z046 Encounter for general psychiatric examination, requested by authority: Secondary | ICD-10-CM | POA: Insufficient documentation

## 2018-09-22 DIAGNOSIS — Z86718 Personal history of other venous thrombosis and embolism: Secondary | ICD-10-CM | POA: Diagnosis not present

## 2018-09-22 DIAGNOSIS — M5442 Lumbago with sciatica, left side: Secondary | ICD-10-CM | POA: Diagnosis present

## 2018-09-22 DIAGNOSIS — F332 Major depressive disorder, recurrent severe without psychotic features: Principal | ICD-10-CM | POA: Diagnosis present

## 2018-09-22 DIAGNOSIS — W1830XA Fall on same level, unspecified, initial encounter: Secondary | ICD-10-CM | POA: Diagnosis not present

## 2018-09-22 DIAGNOSIS — R45851 Suicidal ideations: Secondary | ICD-10-CM | POA: Diagnosis present

## 2018-09-22 DIAGNOSIS — F17210 Nicotine dependence, cigarettes, uncomplicated: Secondary | ICD-10-CM | POA: Diagnosis present

## 2018-09-22 DIAGNOSIS — R600 Localized edema: Secondary | ICD-10-CM | POA: Diagnosis present

## 2018-09-22 DIAGNOSIS — R000 Tachycardia, unspecified: Secondary | ICD-10-CM | POA: Insufficient documentation

## 2018-09-22 DIAGNOSIS — M5441 Lumbago with sciatica, right side: Secondary | ICD-10-CM | POA: Diagnosis present

## 2018-09-22 DIAGNOSIS — Y9367 Activity, basketball: Secondary | ICD-10-CM

## 2018-09-22 DIAGNOSIS — R609 Edema, unspecified: Secondary | ICD-10-CM | POA: Diagnosis not present

## 2018-09-22 DIAGNOSIS — M545 Low back pain: Secondary | ICD-10-CM | POA: Insufficient documentation

## 2018-09-22 DIAGNOSIS — Z590 Homelessness: Secondary | ICD-10-CM | POA: Diagnosis not present

## 2018-09-22 DIAGNOSIS — Z62810 Personal history of physical and sexual abuse in childhood: Secondary | ICD-10-CM | POA: Diagnosis present

## 2018-09-22 DIAGNOSIS — K219 Gastro-esophageal reflux disease without esophagitis: Secondary | ICD-10-CM | POA: Diagnosis present

## 2018-09-22 DIAGNOSIS — G8929 Other chronic pain: Secondary | ICD-10-CM | POA: Diagnosis present

## 2018-09-22 DIAGNOSIS — G4700 Insomnia, unspecified: Secondary | ICD-10-CM | POA: Diagnosis present

## 2018-09-22 DIAGNOSIS — F515 Nightmare disorder: Secondary | ICD-10-CM | POA: Diagnosis present

## 2018-09-22 DIAGNOSIS — F17200 Nicotine dependence, unspecified, uncomplicated: Secondary | ICD-10-CM | POA: Insufficient documentation

## 2018-09-22 LAB — RAPID URINE DRUG SCREEN, HOSP PERFORMED
Amphetamines: NOT DETECTED
BARBITURATES: NOT DETECTED
Benzodiazepines: NOT DETECTED
Cocaine: NOT DETECTED
Opiates: NOT DETECTED
Tetrahydrocannabinol: POSITIVE — AB

## 2018-09-22 LAB — SALICYLATE LEVEL: Salicylate Lvl: <7 mg/dL (ref 2.8–30.0)

## 2018-09-22 LAB — CBC
HCT: 40.9 % (ref 36.0–46.0)
Hemoglobin: 13 g/dL (ref 12.0–15.0)
MCH: 26.8 pg (ref 26.0–34.0)
MCHC: 31.8 g/dL (ref 30.0–36.0)
MCV: 84.3 fL (ref 80.0–100.0)
Platelets: 222 10*3/uL (ref 150–400)
RBC: 4.85 MIL/uL (ref 3.87–5.11)
RDW: 15.4 % (ref 11.5–15.5)
WBC: 5.9 10*3/uL (ref 4.0–10.5)
nRBC: 0 % (ref 0.0–0.2)

## 2018-09-22 LAB — COMPREHENSIVE METABOLIC PANEL
ALBUMIN: 3.7 g/dL (ref 3.5–5.0)
ALT: 18 U/L (ref 0–44)
AST: 22 U/L (ref 15–41)
Alkaline Phosphatase: 77 U/L (ref 38–126)
Anion gap: 6 (ref 5–15)
BUN: 7 mg/dL (ref 6–20)
CO2: 26 mmol/L (ref 22–32)
CREATININE: 0.66 mg/dL (ref 0.44–1.00)
Calcium: 8.5 mg/dL — ABNORMAL LOW (ref 8.9–10.3)
Chloride: 106 mmol/L (ref 98–111)
GFR calc Af Amer: >60 mL/min (ref >60)
GFR calc non Af Amer: >60 mL/min (ref >60)
Glucose, Bld: 98 mg/dL (ref 70–99)
Potassium: 3.6 mmol/L (ref 3.5–5.1)
Sodium: 138 mmol/L (ref 135–145)
Total Bilirubin: 0.5 mg/dL (ref 0.3–1.2)
Total Protein: 8.2 g/dL — ABNORMAL HIGH (ref 6.5–8.1)

## 2018-09-22 LAB — PROTIME-INR
INR: 0.94
Prothrombin Time: 12.5 seconds (ref 11.4–15.2)

## 2018-09-22 LAB — ETHANOL: Alcohol, Ethyl (B): <10 mg/dL (ref <10)

## 2018-09-22 LAB — ACETAMINOPHEN LEVEL: Acetaminophen (Tylenol), Serum: <10 ug/mL — ABNORMAL LOW (ref 10–30)

## 2018-09-22 LAB — I-STAT BETA HCG BLOOD, ED (MC, WL, AP ONLY): I-stat hCG, quantitative: <5 m[IU]/mL (ref <5)

## 2018-09-22 MED ORDER — ACETAMINOPHEN 500 MG PO TABS
1000.0000 mg | ORAL_TABLET | Freq: Once | ORAL | Status: AC
  Administered 2018-09-22: 1000 mg via ORAL
  Filled 2018-09-22: qty 2

## 2018-09-22 MED ORDER — CYCLOBENZAPRINE HCL 10 MG PO TABS
5.0000 mg | ORAL_TABLET | Freq: Once | ORAL | Status: AC
  Administered 2018-09-22: 5 mg via ORAL
  Filled 2018-09-22: qty 1

## 2018-09-22 MED ORDER — ACETAMINOPHEN 325 MG PO TABS
650.0000 mg | ORAL_TABLET | Freq: Four times a day (QID) | ORAL | Status: DC | PRN
  Administered 2018-09-23 – 2018-10-05 (×4): 650 mg via ORAL
  Filled 2018-09-22 (×4): qty 2

## 2018-09-22 MED ORDER — ALUM & MAG HYDROXIDE-SIMETH 200-200-20 MG/5ML PO SUSP: 30.0000 mL | ORAL | Status: DC | PRN

## 2018-09-22 MED ORDER — ACETAMINOPHEN 325 MG PO TABS
650.0000 mg | ORAL_TABLET | Freq: Once | ORAL | Status: AC
  Administered 2018-09-22: 650 mg via ORAL
  Filled 2018-09-22: qty 2

## 2018-09-22 MED ORDER — TRAZODONE HCL 100 MG PO TABS: 100.0000 mg | ORAL_TABLET | Freq: Every evening | ORAL | Status: DC | PRN

## 2018-09-22 MED ORDER — HYDROXYZINE HCL 25 MG PO TABS
25.0000 mg | ORAL_TABLET | Freq: Four times a day (QID) | ORAL | Status: DC | PRN
  Administered 2018-09-22 – 2018-09-25 (×3): 25 mg via ORAL
  Filled 2018-09-22 (×3): qty 1

## 2018-09-22 MED ORDER — MAGNESIUM HYDROXIDE 400 MG/5ML PO SUSP: 30.0000 mL | Freq: Every day | ORAL | Status: DC | PRN

## 2018-09-22 MED ORDER — TRAZODONE HCL 100 MG PO TABS
100.0000 mg | ORAL_TABLET | Freq: Every evening | ORAL | Status: DC | PRN
  Administered 2018-09-22 – 2018-09-29 (×8): 100 mg via ORAL
  Filled 2018-09-22 (×17): qty 1

## 2018-09-22 MED ORDER — NICOTINE 21 MG/24HR TD PT24
21.0000 mg | MEDICATED_PATCH | Freq: Every day | TRANSDERMAL | Status: DC
  Administered 2018-09-23 – 2018-10-04 (×8): 21 mg via TRANSDERMAL
  Filled 2018-09-22 (×18): qty 1

## 2018-09-22 NOTE — BH Assessment (Signed)
[facility] Assessment Progress Note  Per Caryn Bee, PHMNP, this pt requires psychiatric hospitalization at this time.  Lamount Cranker, RN, [facility] has assigned pt to [facility] Rm 17:30, pending medical clearance; [facility] will be ready to receive pt at 17:30.  Pt has signed Voluntary Admission and Consent for Treatment, as well as Consent to Release Information [facility] and to pt's mother, and a notification call has been placed to the provider.  Signed forms have been faxed to [facility].  Pt's nurse, Morrie Sheldon, has been notified, and agrees to send original paperwork along with pt via Juel Burrow, and to call report to [phone].  Doylene Canning, [place] [facility] Coordinator [phone]

## 2018-09-22 NOTE — ED Notes (Signed)
Bed: WBH39 Expected date:  Expected time:  Means of arrival:  Comments: 27 

## 2018-09-22 NOTE — ED Provider Notes (Signed)
[facility] Emergency Department Provider Note MRN:  [id number]  Arrival date & time: 09/22/18     Chief Complaint   Medical Clearance   History of Present Illness   Christina [name] is a 48 y.o. year-old female with a history of bipolar disorder, depression, DVT presenting to the ED with chief complaint of medical clearance.  Sent here from [facility] for medical clearance, having worsening suicidal ideation for the past 2 weeks.  Triggered by recent relationship that has ended.  Denying specific plan.  Patient also endorsing 1 to 2 weeks of worsening chronic back pain, located in bilateral lower back radiating into the gluteals.  Denies leg pain, no leg swelling.  Denies chest pain or shortness of breath.  Denies fever.  Denies numbness or weakness to the arms or legs, no bowel or bladder dysfunction.  Has not been taking her blood thinner for the past 4 months.  Review of Systems  A complete 10 system review of systems was obtained and all systems are negative except as noted in the HPI and PMH.   Patient's Health History    Past Medical History:  Diagnosis Date  . Anxiety   . Back pain   . 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 INTRAVASCULAR ULTRASOUND/IVUS Left 11/03/2016   Procedure: Intravascular Ultrasound/IVUS;  Surgeon: Maeola Harman, MD;  Location: [facility] INVASIVE CV LAB;  Service: Cardiovascular;  Laterality: Left;  . LOWER EXTREMITY VENOGRAPHY Left 11/04/2016   Procedure: Lower Extremity Venography;  Surgeon: Nada Libman, MD;  Location: [facility] INVASIVE CV LAB;  Service: Cardiovascular;  Laterality: Left;  . PERIPHERAL VASCULAR INTERVENTION Left 11/04/2016   Procedure: Peripheral Vascular Intervention;  Surgeon: Nada Libman, MD;  Location: [facility] INVASIVE CV LAB;  Service: Cardiovascular;  Laterality: Left;  common external  . PERIPHERAL VASCULAR THROMBECTOMY Left 11/03/2016   Procedure: Peripheral Vascular Thrombectomy;  Surgeon: Maeola Harman, MD;  Location: [facility] INVASIVE CV LAB;  Service: Cardiovascular;  Laterality: Left;    No family history on file.  Social History   Socioeconomic History  . Marital status: Divorced    Spouse name: Not on file  . Number of children: Not on file  . Years of education: Not on file  . Highest education level: Not on file  Occupational History  . Not on file  Social Needs  . Financial resource strain: Very hard  . Food insecurity:    Worry: Sometimes true    Inability: Sometimes true  . Transportation needs:    Medical: No    Non-medical: No  Tobacco Use  . Smoking status: Current Every Day Smoker    Packs/day: 0.25  . Smokeless tobacco: Never Used  Substance and Sexual Activity  . Alcohol use: No  . Drug use: No  . Sexual activity: Yes    Birth control/protection: Other-see comments    Comment: Tubal ligation  Lifestyle  . Physical activity:    Days per week: 2 days    Minutes per session: 10 min  . Stress: Very much  Relationships  . Social connections:    Talks on phone: Three times a week    Gets together: More than three times a week    Attends religious service: Never    Active member of club or organization: No    Attends meetings of clubs or organizations: Never    Relationship status: Divorced  .  Intimate partner violence:    Fear of current or ex partner: No    Emotionally abused: No    Physically abused: No    Forced sexual activity: No  Other Topics Concern  . Not on file  Social History Narrative  . Not on file     Physical Exam  Vital Signs and Nursing Notes reviewed Vitals:   09/22/18 1313  BP: (!) 131/91  Pulse: 99  Resp: 16  Temp: 98.1 F (36.7 C)  SpO2: 99%    CONSTITUTIONAL: Well-appearing, NAD NEURO:  Alert and oriented x 3, no focal deficits EYES:  eyes equal and reactive ENT/NECK:  no LAD, no JVD CARDIO: Regular rate, well-perfused, normal S1 and  S2 PULM:  CTAB no wheezing or rhonchi GI/GU:  normal bowel sounds, non-distended, non-tender MSK/SPINE:  No gross deformities, no edema; mild bilateral lumbar tenderness to palpation SKIN:  no rash, atraumatic PSYCH:  Appropriate speech and behavior  Diagnostic and Interventional Summary    Labs Reviewed  RAPID URINE DRUG SCREEN, HOSP PERFORMED - Abnormal; Notable for the following components:      Result Value   Tetrahydrocannabinol POSITIVE (*)    All other components within normal limits  CBC  PROTIME-INR  COMPREHENSIVE METABOLIC PANEL  ETHANOL  SALICYLATE LEVEL  ACETAMINOPHEN LEVEL  I-STAT BETA HCG BLOOD, ED ([facility], [facility], [facility] ONLY)    No orders to display    Medications  cyclobenzaprine (FLEXERIL) tablet 5 mg (5 mg Oral Given 09/22/18 1415)  acetaminophen (TYLENOL) tablet 1,000 mg (1,000 mg Oral Given 09/22/18 1415)     Procedures Critical Care  ED Course and Medical Decision Making  I have reviewed the triage vital signs and the nursing notes.  Pertinent labs & imaging results that were available during my care of the patient were reviewed by me and considered in my medical decision making (see below for details).  No leg pain, no leg tenderness, no leg swelling, no leg erythema, nothing on exam to suggest DVT.  No chest pain or shortness of breath to suggest PE.  No bowel or bladder dysfunction, no neurological deficits to suggest myelopathy.  Awaiting documentation of vital signs, after confirmation of normal vital signs she will be medically cleared.  Vital signs are normal.  Patient is medically cleared.  She should restart her anticoagulation.  TTS is recommending inpatient psychiatric care.  Signed out to provider default at shift change.  Elmer SowMichael M. Pilar PlateBero, MD [facility] [email or web address]  Final Clinical Impressions(s) / ED Diagnoses     ICD-10-CM   1. Severe episode of recurrent major depressive disorder, without  psychotic features (HCC) F33.2   2. Tachycardia R00.0     ED Discharge Orders    None         Sabas SousBero, Breyden Jeudy M, MD 09/22/18 [phone]

## 2018-09-22 NOTE — ED Notes (Signed)
Pt to room #39. Pleasant on approach. Pt oriented to unit, Awaiting transfer to [facility] Adult unit. Special checks q 15 mins in place for safety, video monitoring in place. Will continue to monitor.

## 2018-09-22 NOTE — ED Notes (Addendum)
EDP Provider at bedside. 

## 2018-09-22 NOTE — ED Notes (Signed)
Lamount Cranker called to have patient arrive at 9pm tonight instead of 5:30.

## 2018-09-22 NOTE — Tx Team (Signed)
Initial Treatment Plan 09/22/2018 11:44 PM Christina [name] ION:[id number]    PATIENT STRESSORS: Health problems Marital or family conflict   PATIENT STRENGTHS: Ability for insight Average or above average intelligence Capable of independent living Communication skills General fund of knowledge Motivation for treatment/growth   PATIENT IDENTIFIED PROBLEMS: Depression Suicidal thoughts "I don't know what I need but I know I need help"                     DISCHARGE CRITERIA:  Ability to meet basic life and health needs Improved stabilization in mood, thinking, and/or behavior Reduction of life-threatening or endangering symptoms to within safe limits Verbal commitment to aftercare and medication compliance  PRELIMINARY DISCHARGE PLAN: Attend aftercare/continuing care group Return to previous living arrangement  PATIENT/FAMILY INVOLVEMENT: This treatment plan has been presented to and reviewed with the patient, Christina [name], and/or family member, .  The patient and family have been given the opportunity to ask questions and make suggestions.  Christina [name], [place], [place] 09/22/2018, 11:44 PM
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
## 2018-09-22 NOTE — ED Notes (Signed)
Attempted to call report to [facility].  Nurse unavailable to take report.  She will call back later.  Left name and number.

## 2018-09-22 NOTE — BH Assessment (Signed)
Assessment Note  Christina [name] is an 48 y.o. female. Pt reports SI with no plan but cannot contract for safety. Pt denies HI/AVH. Pt reports depressive symptoms. Per Pt she is depressed due to a recent break-up, due to housing issues, and due to financial issues. Pt was seen at [facility] until she lost her insurance 4 months ago. The  Pt had her 1st appointment with [facility] today and they sent her to the ED due to SI. Pt denies previous SI attempts. Pt denies previous hospitalization.   Fredna Dow, NP recommends inpatient treatment.  Diagnosis:  F31.4 Bipolar, depression  Past Medical History:  Past Medical History:  Diagnosis Date  . Anxiety   . Back pain   . 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 INTRAVASCULAR ULTRASOUND/IVUS Left 11/03/2016   Procedure: Intravascular Ultrasound/IVUS;  Surgeon: Maeola Harman, MD;  Location: [facility] INVASIVE CV LAB;  Service: Cardiovascular;  Laterality: Left;  . LOWER EXTREMITY VENOGRAPHY Left 11/04/2016   Procedure: Lower Extremity Venography;  Surgeon: Nada Libman, MD;  Location: [facility] INVASIVE CV LAB;  Service: Cardiovascular;  Laterality: Left;  . PERIPHERAL VASCULAR INTERVENTION Left 11/04/2016   Procedure: Peripheral Vascular Intervention;  Surgeon: Nada Libman, MD;  Location: [facility] INVASIVE CV LAB;  Service: Cardiovascular;  Laterality: Left;  common external  . PERIPHERAL VASCULAR THROMBECTOMY Left 11/03/2016   Procedure: Peripheral Vascular Thrombectomy;  Surgeon: Maeola Harman, MD;  Location: [facility] INVASIVE CV LAB;  Service: Cardiovascular;  Laterality: Left;    Family History: No family history on file.  Social History:  reports that she has been smoking. She has been smoking about 0.25 packs per day. She has never used smokeless tobacco. She reports that she does not drink alcohol or use drugs.  Additional Social History:  Alcohol / Drug Use Pain  Medications: please see mar Prescriptions: please see mar Over the Counter: please see mar History of alcohol / drug use?: No history of alcohol / drug abuse Longest period of sobriety (when/how long): NA  CIWA: CIWA-Ar BP: (!) 131/91 Pulse Rate: 99 COWS:    Allergies: No Known Allergies  Home Medications: (Not in a hospital admission)   OB/GYN Status:  Patient's last menstrual period was 09/22/2018.  General Assessment Data Location of Assessment: [facility] ED TTS Assessment: In system Is this a Tele or Face-to-Face Assessment?: Face-to-Face Is this an Initial Assessment or a Re-assessment for this encounter?: Initial Assessment Patient Accompanied by:: N/A Language Other than English: No Living Arrangements: Other (Comment) What gender do you identify as?: Female Marital status: Single Maiden name: NA Pregnancy Status: No Living Arrangements: Alone Can pt return to current living arrangement?: Yes Admission Status: Voluntary Is patient capable of signing voluntary admission?: Yes Referral Source: Self/Family/Friend Insurance type: SP     Crisis Care Plan Living Arrangements: Alone Legal Guardian: Other:(self) Name of Psychiatrist: NA Name of Therapist: NA  Education Status Is patient currently in school?: No Is the patient employed, unemployed or receiving disability?: Unemployed  Risk to self with the past 6 months Suicidal Ideation: Yes-Currently Present Has patient been a risk to self within the past 6 months prior to admission? : No Suicidal Intent: No Has patient had any suicidal intent within the past 6 months prior to admission? : No Is patient at risk for suicide?: No Suicidal Plan?: No Has patient had any suicidal plan within the past 6 months prior  to admission? : No Access to Means: No What has been your use of drugs/alcohol within the last 12 months?: NA Previous Attempts/Gestures: No How many times?: 0 Other Self Harm Risks: NA Triggers for Past  Attempts: None known Intentional Self Injurious Behavior: None Family Suicide History: No Recent stressful life event(s): Financial Problems, Job Loss, Conflict (Comment) Persecutory voices/beliefs?: No Depression: Yes Depression Symptoms: Tearfulness, Isolating, Loss of interest in usual pleasures, Feeling worthless/self pity, Feeling angry/irritable Substance abuse history and/or treatment for substance abuse?: No Suicide prevention information given to non-admitted patients: Not applicable  Risk to Others within the past 6 months Homicidal Ideation: No Does patient have any lifetime risk of violence toward others beyond the six months prior to admission? : No Thoughts of Harm to Others: No Current Homicidal Intent: No Current Homicidal Plan: No Access to Homicidal Means: No Identified Victim: NA History of harm to others?: No Assessment of Violence: None Noted Violent Behavior Description: NA Does patient have access to weapons?: No Criminal Charges Pending?: No Does patient have a court date: No Is patient on probation?: No  Psychosis Hallucinations: None noted Delusions: None noted  Mental Status Report Appearance/Hygiene: Unremarkable Eye Contact: Fair Motor Activity: Freedom of movement Speech: Logical/coherent Level of Consciousness: Alert Mood: Depressed Affect: Depressed Anxiety Level: Minimal Thought Processes: Relevant, Coherent Judgement: Unimpaired Orientation: Person, Place, Time, Situation Obsessive Compulsive Thoughts/Behaviors: None  Cognitive Functioning Concentration: Normal Memory: Recent Intact, Remote Intact Is patient IDD: No Insight: Fair Impulse Control: Fair Appetite: Fair Have you had any weight changes? : No Change Sleep: No Change Total Hours of Sleep: 8 Vegetative Symptoms: None  ADLScreening [facility] Assessment Services) Patient's cognitive ability adequate to safely complete daily activities?: Yes Patient able to express need for  assistance with ADLs?: Yes Independently performs ADLs?: Yes (appropriate for developmental age)  Prior Inpatient Therapy Prior Inpatient Therapy: No  Prior Outpatient Therapy Prior Outpatient Therapy: Yes Prior Therapy Dates: current Prior Therapy Facilty/Provider(s): [facility] Reason for Treatment: NA Does patient have an ACCT team?: No Does patient have Intensive In-House Services?  : No Does patient have [facility] services? : No Does patient have P4CC services?: No  ADL Screening (condition at time of admission) Patient's cognitive ability adequate to safely complete daily activities?: Yes Is the patient deaf or have difficulty hearing?: No Does the patient have difficulty seeing, even when wearing glasses/contacts?: No Does the patient have difficulty concentrating, remembering, or making decisions?: No Patient able to express need for assistance with ADLs?: Yes Does the patient have difficulty dressing or bathing?: No Independently performs ADLs?: Yes (appropriate for developmental age)       Abuse/Neglect Assessment (Assessment to be complete while patient is alone) Abuse/Neglect Assessment Can Be Completed: Yes Physical Abuse: Denies Verbal Abuse: Denies Sexual Abuse: Denies Exploitation of patient/patient's resources: Denies     Merchant navy officerAdvance Directives (For Healthcare) Does Patient Have a Medical Advance Directive?: No Would patient like information on creating a medical advance directive?: No - Patient declined          Disposition:  Disposition Initial Assessment Completed for this Encounter: Yes  On Site Evaluation by:   Reviewed with Physician:    Emmit PomfretLevette,Alby Schwabe D 09/22/2018 2:19 PM

## 2018-09-22 NOTE — ED Notes (Signed)
Pt transferred to behavior health. Pt stable, pt belongings given to transportation.

## 2018-09-22 NOTE — ED Triage Notes (Signed)
[facility] C/o SI worsening for two weeks without plan. HX bipolar and depression. Also c/o chronic lower back pain with hx sciatica. States DVT hx and has not been taking meds. Denies leg pain.

## 2018-09-22 NOTE — ED Notes (Signed)
Pt alert and oriented. Pt c/o of chronic back pain. Pt denies c/o si without a plan. Pt contract to safety. Pt denies any hi or avh a this time. Pt cooperative and resting in room pt safe will continue to monitor.

## 2018-09-22 NOTE — ED Notes (Signed)
TTS AT BEDSIDE 

## 2018-09-23 MED ORDER — QUETIAPINE FUMARATE 200 MG PO TABS
200.0000 mg | ORAL_TABLET | Freq: Every day | ORAL | Status: DC
  Administered 2018-09-23: 200 mg via ORAL
  Filled 2018-09-23 (×3): qty 1

## 2018-09-23 MED ORDER — PRAZOSIN HCL 1 MG PO CAPS
1.0000 mg | ORAL_CAPSULE | Freq: Every day | ORAL | Status: DC
  Administered 2018-09-23 – 2018-10-05 (×13): 1 mg via ORAL
  Filled 2018-09-23: qty 1
  Filled 2018-09-23: qty 7
  Filled 2018-09-23 (×14): qty 1

## 2018-09-23 MED ORDER — GABAPENTIN 300 MG PO CAPS
300.0000 mg | ORAL_CAPSULE | Freq: Three times a day (TID) | ORAL | Status: DC
  Administered 2018-09-23 – 2018-09-27 (×12): 300 mg via ORAL
  Filled 2018-09-23 (×18): qty 1

## 2018-09-23 MED ORDER — DULOXETINE HCL 30 MG PO CPEP
30.0000 mg | ORAL_CAPSULE | Freq: Every day | ORAL | Status: DC
  Administered 2018-09-23 – 2018-09-24 (×2): 30 mg via ORAL
  Filled 2018-09-23 (×4): qty 1
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
## 2018-09-23 NOTE — Progress Notes (Signed)
D:  Patient's self inventory sheet, patient has poor sleep, sleep medication not helpful.  Fair appetite, normal energy level, concentration OK.  Rated depression, hopeless and a anxiety 8.  Denied withdrawals.   SI, off/on, contracts for safety, no plan.Physical problems, headaches, pain, lower back, buttocks, legs, sciatica, pain medicine helps.  Goal is "peace of mind".  Plans to change her way of thinking.  "I'm working on loving myself, putting myself first for once in my life.  I am worthy of happiness, peace and joy." A:  Medications administered per MD orders.  Emotional support and encouragement given patient. R:  Denied HI.  Denied A/V hallucinations.  SI, off/on, contracts for safety, no plan.

## 2018-09-23 NOTE — [facility] Group Notes (Signed)
[facility] Mental [facility] Association Group Therapy      09/23/2018 3:39 PM  Type of Therapy: Mental Health Association Presentation  Participation Level: Active  Participation Quality: Attentive  Affect: Appropriate  Cognitive: Oriented  Insight: Developing/Improving  Engagement in Therapy: Engaged  Modes of Intervention: Discussion, Education and Socialization  Summary of Progress/Problems: Mental Health Association (MHA) Speaker came to talk about his personal journey with mental health. The pt processed ways by which to relate to the speaker. MHA speaker provided handouts and educational information pertaining to groups and services offered by the [facility]. Pt was engaged in speaker's presentation and was receptive to resources provided.    Alcario Drought Clinical Social Worker

## 2018-09-23 NOTE — Progress Notes (Signed)
Report received from admitting RN.  Met with pt 1:1.  Pt reports passive SI and she verbally contracts for safety.  She denies HI, denies hallucinations.  Medication administered per order.  PRN medication administered for anxiety.  Fall prevention techniques reviewed with pt and she verbalized understanding.  Pt agrees to notify staff of concerns and needs.  She is safe on the unit.  Will continue to monitor and assess.
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
## 2018-09-23 NOTE — Progress Notes (Addendum)
D: Pt was in hallway upon initial approach.  Pt presents with depressed affect and mood.  Pt reports her day was "okay.  My mind is still racing a lot."  She states "group really helped this morning."  She discussed how she hopes to talk about potential aftercare plans with social worker tomorrow because she does not feel that her current living situation is "conducive to my healing."  Pt reports she had a good visit with her mother and best friend tonight.  Pt denies SI/HI, denies hallucinations, reports back pain of 8/10.  Pt has been visible in milieu interacting with peers and staff appropriately.  Pt attended evening group.    A: Introduced self to pt.  Met with pt 1:1.  Actively listened to pt and offered support and encouragement.  Medications administered per order.  PRN medication administered for pain.  Heat packs provided for pain.  Fall prevention techniques reviewed with pt and she verbalized understanding.  Q15 minute safety checks maintained.  R: Pt is safe on the unit.  Pt is compliant with medications.  Pt verbally contracts for safety.  Will continue to monitor and assess.

## 2018-09-23 NOTE — Plan of Care (Signed)
Nurse discussed anxiety, depression, coping skills with patient. 

## 2018-09-23 NOTE — Progress Notes (Signed)
Adult Psychoeducational Group Note  Date:  09/23/2018 Time:  10:27 PM  Group Topic/Focus:  Wrap-Up Group:   The focus of this group is to help patients review their daily goal of treatment and discuss progress on daily workbooks.  Participation Level:  Active  Participation Quality:  Appropriate  Affect:  Appropriate  Cognitive:  Alert and Oriented  Insight: Appropriate  Engagement in Group:  Developing/Improving  Modes of Intervention:  Clarification, Exploration and Support  Additional Comments:  Pt verbalized that she rated her day an 8. Pt verbalized that something positive was that her mom and best friend came by and that she was able to meet a new friend. Pt verbalized that she would like to work on putting herself first, changing her thoughts, and gettingher mind right.  Rei Contee, Randal Buba 09/23/2018, 10:27 PM

## 2018-09-24 MED ORDER — DULOXETINE HCL 20 MG PO CPEP
40.0000 mg | ORAL_CAPSULE | Freq: Every day | ORAL | Status: DC
  Administered 2018-09-25 – 2018-09-27 (×3): 40 mg via ORAL
  Filled 2018-09-24 (×4): qty 2

## 2018-09-24 MED ORDER — QUETIAPINE FUMARATE 300 MG PO TABS
300.0000 mg | ORAL_TABLET | Freq: Every day | ORAL | Status: DC
  Administered 2018-09-24 – 2018-09-28 (×5): 300 mg via ORAL
  Filled 2018-09-24 (×7): qty 1

## 2018-09-24 MED ORDER — RIVAROXABAN 20 MG PO TABS
20.0000 mg | ORAL_TABLET | Freq: Every day | ORAL | Status: DC
  Administered 2018-09-24 – 2018-10-05 (×12): 20 mg via ORAL
  Filled 2018-09-24 (×12): qty 1
  Filled 2018-09-24: qty 7
  Filled 2018-09-24: qty 1

## 2018-09-24 NOTE — Tx Team (Signed)
Interdisciplinary Treatment and Diagnostic Plan Update  09/24/2018 Time of Session:  Christina [name] MRN: [id number]  Principal Diagnosis: <principal problem not specified>  Secondary Diagnoses: Active Problems:   MDD (major depressive disorder), recurrent episode, severe ([facility])   Current Medications:  Current Facility-Administered Medications  Medication Dose Route Frequency Provider Last Rate Last Dose  . acetaminophen (TYLENOL) tablet 650 mg  650 mg Oral Q6H PRN Laverle Hobby, PA-C   650 mg at 09/23/18 2015  . alum & mag hydroxide-simeth (MAALOX/MYLANTA) 200-200-20 MG/5ML suspension 30 mL  30 mL Oral Q4H PRN Laverle Hobby, PA-C      . DULoxetine (CYMBALTA) DR capsule 30 mg  30 mg Oral Daily Sharma Covert, MD   30 mg at 09/24/18 0753  . gabapentin (NEURONTIN) capsule 300 mg  300 mg Oral TID Sharma Covert, MD   300 mg at 09/24/18 0753  . hydrOXYzine (ATARAX/VISTARIL) tablet 25 mg  25 mg Oral Q6H PRN Laverle Hobby, PA-C   25 mg at 09/23/18 1610  . magnesium hydroxide (MILK OF MAGNESIA) suspension 30 mL  30 mL Oral Daily PRN Patriciaann Clan E, PA-C      . nicotine (NICODERM CQ - dosed in mg/24 hours) patch 21 mg  21 mg Transdermal Daily Sharma Covert, MD   21 mg at 09/24/18 0759  . prazosin (MINIPRESS) capsule 1 mg  1 mg Oral QHS Sharma Covert, MD   1 mg at 09/23/18 2152  . QUEtiapine (SEROQUEL) tablet 200 mg  200 mg Oral QHS Sharma Covert, MD   200 mg at 09/23/18 2152  . traZODone (DESYREL) tablet 100 mg  100 mg Oral QHS,MR X 1 Laverle Hobby, PA-C   100 mg at 09/23/18 2153   PTA Medications: Medications Prior to Admission  Medication Sig Dispense Refill Last Dose  . acetaminophen (TYLENOL) 500 MG tablet Take 500 mg by mouth every 6 (six) hours as needed.   09/21/2018 at Unknown time  . acetaminophen (TYLENOL) 650 MG CR tablet Take 975 mg by mouth daily as needed for pain.   09/21/2018 at Unknown time  . amLODipine (NORVASC) 10 MG tablet Take 1 tablet (10  mg total) by mouth daily. (Patient not taking: Reported on 09/22/2018) 90 tablet 1 Not Taking at Unknown time  . carvedilol (COREG) 12.5 MG tablet Take 1 tablet (12.5 mg total) by mouth 2 (two) times daily with a meal. (Patient not taking: Reported on 09/22/2018) 90 tablet 1 Not Taking at Unknown time  . escitalopram (LEXAPRO) 20 MG tablet Take 1 tablet (20 mg total) by mouth daily. (Patient not taking: Reported on 09/22/2018) 90 tablet 0 Not Taking at Unknown time  . gabapentin (NEURONTIN) 300 MG capsule Take 2 capsules (600 mg total) by mouth 3 (three) times daily. (Patient not taking: Reported on 09/22/2018) 180 capsule 3 Not Taking at Unknown time  . HYDROcodone-acetaminophen (NORCO) 5-325 MG tablet Take 1 tablet by mouth at bedtime. (Patient not taking: Reported on 09/22/2018) 14 tablet 0 Not Taking at Unknown time  . ibuprofen (ADVIL,MOTRIN) 200 MG tablet Take 600 mg by mouth daily as needed for moderate pain.   Past Week at Unknown time  . nitrofurantoin, macrocrystal-monohydrate, (MACROBID) 100 MG capsule Take 1 capsule (100 mg total) by mouth 2 (two) times daily. (Patient not taking: Reported on 09/22/2018) 10 capsule 0 Not Taking at Unknown time  . QUEtiapine (SEROQUEL) 200 MG tablet Take 1 tablet (200 mg total) by mouth at bedtime. (Patient not  taking: Reported on 09/22/2018) 90 tablet 0 Not Taking at Unknown time  . rivaroxaban (XARELTO) 20 MG TABS tablet Take 1 tablet (20 mg total) by mouth daily with supper. (Patient not taking: Reported on 09/22/2018) 90 tablet 3 Not Taking at Unknown time  . SUMAtriptan (IMITREX) 25 MG tablet Take 1 tablet (25 mg total) by mouth daily. May take one more tablet two hours after the first. No more than two tablets per day. (Patient not taking: Reported on 09/22/2018) 9 tablet 1 Not Taking at Unknown time  . topiramate (TOPAMAX) 25 MG tablet TAKE 1 TABLET BY MOUTH 2 TIMES DAILY. (Patient not taking: Reported on 09/22/2018) 60 tablet 1 Not Taking at Unknown time     Patient Stressors: Health problems Marital or family conflict  Patient Strengths: Ability for insight Average or above average intelligence Capable of independent living Communication skills General fund of knowledge Motivation for treatment/growth  Treatment Modalities: Medication Management, Group therapy, Case management,  1 to 1 session with clinician, Psychoeducation, Recreational therapy.   Physician Treatment Plan for Primary Diagnosis: <principal problem not specified> Long Term Goal(s): Improvement in symptoms so as ready for discharge Improvement in symptoms so as ready for discharge   Short Term Goals: Ability to identify changes in lifestyle to reduce recurrence of condition will improve Ability to verbalize feelings will improve Ability to disclose and discuss suicidal ideas Ability to demonstrate self-control will improve Ability to identify and develop effective coping behaviors will improve Ability to maintain clinical measurements within normal limits will improve Compliance with prescribed medications will improve Ability to identify triggers associated with substance abuse/mental health issues will improve Ability to identify changes in lifestyle to reduce recurrence of condition will improve Ability to verbalize feelings will improve Ability to disclose and discuss suicidal ideas Ability to demonstrate self-control will improve Ability to identify and develop effective coping behaviors will improve Ability to maintain clinical measurements within normal limits will improve Compliance with prescribed medications will improve Ability to identify triggers associated with substance abuse/mental health issues will improve  Medication Management: Evaluate patient's response, side effects, and tolerance of medication regimen.  Therapeutic Interventions: 1 to 1 sessions, Unit Group sessions and Medication administration.  Evaluation of Outcomes: Not  Met  Physician Treatment Plan for Secondary Diagnosis: Active Problems:   MDD (major depressive disorder), recurrent episode, severe ([place])  Long Term Goal(s): Improvement in symptoms so as ready for discharge Improvement in symptoms so as ready for discharge   Short Term Goals: Ability to identify changes in lifestyle to reduce recurrence of condition will improve Ability to verbalize feelings will improve Ability to disclose and discuss suicidal ideas Ability to demonstrate self-control will improve Ability to identify and develop effective coping behaviors will improve Ability to maintain clinical measurements within normal limits will improve Compliance with prescribed medications will improve Ability to identify triggers associated with substance abuse/mental health issues will improve Ability to identify changes in lifestyle to reduce recurrence of condition will improve Ability to verbalize feelings will improve Ability to disclose and discuss suicidal ideas Ability to demonstrate self-control will improve Ability to identify and develop effective coping behaviors will improve Ability to maintain clinical measurements within normal limits will improve Compliance with prescribed medications will improve Ability to identify triggers associated with substance abuse/mental health issues will improve     Medication Management: Evaluate patient's response, side effects, and tolerance of medication regimen.  Therapeutic Interventions: 1 to 1 sessions, Unit Group sessions and Medication administration.  Evaluation  of Outcomes: Not Met   RN Treatment Plan for Primary Diagnosis: <principal problem not specified> Long Term Goal(s): Knowledge of disease and therapeutic regimen to maintain health will improve  Short Term Goals: Ability to participate in decision making will improve, Ability to verbalize feelings will improve, Ability to disclose and discuss suicidal ideas, Ability to  identify and develop effective coping behaviors will improve and Compliance with prescribed medications will improve  Medication Management: RN will administer medications as ordered by provider, will assess and evaluate patient's response and provide education to patient for prescribed medication. RN will report any adverse and/or side effects to prescribing provider.  Therapeutic Interventions: 1 on 1 counseling sessions, Psychoeducation, Medication administration, Evaluate responses to treatment, Monitor vital signs and CBGs as ordered, Perform/monitor CIWA, COWS, AIMS and Fall Risk screenings as ordered, Perform wound care treatments as ordered.  Evaluation of Outcomes: Not Met   LCSW Treatment Plan for Primary Diagnosis: <principal problem not specified> Long Term Goal(s): Safe transition to appropriate next level of care at discharge, Engage patient in therapeutic group addressing interpersonal concerns.  Short Term Goals: Engage patient in aftercare planning with referrals and resources  Therapeutic Interventions: Assess for all discharge needs, 1 to 1 time with Social worker, Explore available resources and support systems, Assess for adequacy in community support network, Educate family and significant other(s) on suicide prevention, Complete Psychosocial Assessment, Interpersonal group therapy.  Evaluation of Outcomes: Progressing   Progress in Treatment: Attending groups: Yes. Participating in groups: Yes. Taking medication as prescribed: Yes. Toleration medication: Yes. Family/Significant other contact made: No, will contact:  patient declined consent for collateral contacts Patient understands diagnosis: Yes. Discussing patient identified problems/goals with staff: Yes. Medical problems stabilized or resolved: Yes. Denies suicidal/homicidal ideation: Yes. Issues/concerns per patient self-inventory: No. Other:   New problem(s) identified: None   New Short Term/Long Term  Goal(s): medication stabilization, elimination of SI thoughts, development of comprehensive mental wellness plan.    Patient Goals: I don't know what I need but I know I need help    Discharge Plan or Barriers: Patient plans to discharge home with her long-time boyfriend. She plans to follow up with [facility] for outpatient medication management and therapy services. CSW will continue to follow and assess for additional referrals and discharge planning.   Reason for Continuation of Hospitalization: Depression Medication stabilization Suicidal ideation  Estimated Length of Stay: 09/28/2018  Attendees: Patient: 09/24/2018 10:59 AM  Physician: Dr. Myles Lipps, MD 09/24/2018 10:59 AM  Nursing: Chrys Racer.Jacinto Reap, RN 09/24/2018 10:59 AM  RN Care Manager: Lars Pinks, RN 09/24/2018 10:59 AM  Social Worker: Radonna Ricker, [place] 09/24/2018 10:59 AM  Recreational Therapist:  09/24/2018 10:59 AM  Other: Harriett Sine, NP  09/24/2018 10:59 AM  Other:  09/24/2018 10:59 AM  Other: 09/24/2018 10:59 AM    Scribe for Treatment Team: Marylee Floras, [place] 09/24/2018 10:59 AM

## 2018-09-24 NOTE — Progress Notes (Signed)
D: Pt passive SI- contracts- getting better ,denies HI/AVH. Pt is pleasant and cooperative. 1:1 time spent talking with pt about CAM ( complementary and alternative) methods to help with her pain . Pt was encouraged to research her issues and talk with her doctors about things that will help reduce her pain : from foods, exercises, and topicals .   A: Pt was offered support and encouragement. Pt was given scheduled medications. Pt was encourage to attend groups. Q 15 minute checks were done for safety.   R:Pt attends groups and interacts well with peers and staff. Pt is taking medication. Pt has no complaints.Pt receptive to treatment and safety maintained on unit. Pt appeared calmer on unit after talk with Clinical research associate  Problem: Education: Goal: Emotional status will improve Outcome: Progressing   Problem: Education: Goal: Mental status will improve Outcome: Progressing   Problem: Education: Goal: Verbalization of understanding the information provided will improve Outcome: Progressing   Problem: Activity: Goal: Interest or engagement in activities will improve Outcome: Progressing
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
## 2018-09-24 NOTE — Plan of Care (Signed)
  Problem: Education: Goal: Emotional status will improve Outcome: Progressing Goal: Verbalization of understanding the information provided will improve Outcome: Progressing   Problem: Activity: Goal: Sleeping patterns will improve Outcome: Progressing   Problem: Coping: Goal: Ability to verbalize frustrations and anger appropriately will improve Outcome: Not Progressing   D: Patient presents with flat, blunted affect; her mood is depressed.  Patient was drowsy, lethargic this morning.  She forwards little information.  She is knowledgeable about her medications; she is compliant.  She has passive SI; however, she contracts for safety on the unit.  Patient remains isolative to her room today.  A: Continue to monitor medication management and MD orders.  Safety checks completed every 15 minutes per protocol.  Offer support and encouragement as needed.  R: Patient is receptive to staff; his/her behavior is appropriate.

## 2018-09-24 NOTE — Progress Notes (Signed)
Recreation Therapy Notes  Date:  1.31.20 Time: 0930 Location: 300 [address] Dayroom  Group Topic: Stress Management  Goal Area(s) Addresses:  Patient will identify positive stress management techniques. Patient will identify benefits of using stress management post d/c.  Intervention: Stress Management  Activity :  Progressive Muscle Relaxation.  LRT introduced the stress management technique of progressive muscle relaxation.  LRT read a script that guided patients through the process of tensing and relaxing each muscle group individually.  Patients were to follow along as script was read to engage in activity.  Education:  Stress Management, Discharge Planning.   Education Outcome: Acknowledges Education  Clinical Observations/Feedback:  Pt did not attend group.     Adetokunbo Mccadden, LRT/CTRS        Kylie Simmonds A 09/24/2018 11:21 AM 

## 2018-09-25 DIAGNOSIS — F332 Major depressive disorder, recurrent severe without psychotic features: Principal | ICD-10-CM

## 2018-09-25 MED ORDER — LORAZEPAM 1 MG PO TABS
1.0000 mg | ORAL_TABLET | Freq: Once | ORAL | Status: AC
  Administered 2018-09-25: 1 mg via ORAL

## 2018-09-25 MED ORDER — LORAZEPAM 1 MG PO TABS
ORAL_TABLET | ORAL | Status: AC
  Filled 2018-09-25: qty 1

## 2018-09-25 MED ORDER — HYDROXYZINE HCL 50 MG PO TABS
50.0000 mg | ORAL_TABLET | Freq: Four times a day (QID) | ORAL | Status: DC | PRN
  Administered 2018-09-25 – 2018-10-03 (×6): 50 mg via ORAL
  Filled 2018-09-25 (×6): qty 1
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
## 2018-09-25 NOTE — [facility] Group Notes (Signed)
Adult Psychoeducational Group Note  Date:  09/25/2018 Time:  9:34 PM  Group Topic/Focus:  Wrap-Up Group:   The focus of this group is to help patients review their daily goal of treatment and discuss progress on daily workbooks.  Participation Level:  Active  Participation Quality:  Appropriate and Attentive  Affect:  Appropriate  Cognitive:  Alert and Appropriate  Insight: Appropriate and Good  Engagement in Group:  Engaged  Modes of Intervention:  Discussion and Education  Additional Comments:  Pt attended and participated in wrap up group this evening. Pt rated their day a 3/10, due to incident from today. Before incident, pt was having a great day. Pt goal is to work on forgiveness and "stinky thinking", pt is still working on this goal.   Chrisandra Netters 09/25/2018, 9:34 PM
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
## 2018-09-25 NOTE — Plan of Care (Signed)
  Problem: Education: Goal: Knowledge of [facility] Education information/materials will improve Outcome: Progressing Goal: Emotional status will improve Outcome: Progressing Goal: Verbalization of understanding the information provided will improve Outcome: Progressing   Problem: Activity: Goal: Interest or engagement in activities will improve Outcome: Progressing   Problem: Education: Goal: Mental status will improve Outcome: Not Progressing  D: Patient continues to report anxiety and some depressive symptoms.  She asked about a 72 hour discharge form today; however, decided not to sign one.  Patient continues to have lower back pain and asked if her vistaril could be increased.  Informed MD of same and it is available.  She denies any thoughts of self harm. She is attending groups and interacting well with her peers.  She is sleeping and eating well; her energy level is normal and her concentration is good.  A: Continue to monitor medication management and MD orders.  Safety checks completed every 15 minutes per protocol.  Offer support and encouragement as needed.  R: Patient is receptive to staff; his/her behavior is appropriate.

## 2018-09-25 NOTE — [facility] Group Notes (Signed)
Adult Psychoeducational Group Note  Date:  09/25/2018 Time:  5:57 PM  Group Topic/Focus:  Healthy Communication:   The focus of this group is to discuss communication, barriers to communication, as well as healthy ways to communicate with others.  Participation Level:  Active  Participation Quality:  Appropriate  Affect:  Appropriate  Cognitive:  Appropriate  Insight: Appropriate  Engagement in Group:  Engaged  Modes of Intervention:  Activity, Discussion, Exploration, Rapport Building, Socialization and Support  Additional Comments:  Pt attended and participated during the group activity.  Maclin Guerrette C 09/25/2018, 5:57 PM  
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
## 2018-09-26 NOTE — [facility] Group Notes (Signed)
[facility] Group Notes:  (Nursing/MHT/Case Management/Adjunct)  Date:  09/26/2018  Time:  1:58 PM  Type of Therapy:  Psychoeducational Skills  Participation Level:  Did Not Attend  Participation Quality:  Did not attend  Affect:  Did not attend  Cognitive:  Did not attend  Insight:  None  Engagement in Group:  Did not attend  Modes of Intervention:  Did not attend  Summary of Progress/Problems: Pt did not attend Psychoeducational group with topic healthy support systems.   Christina [name] 09/26/2018, 1:58 PM 

## 2018-09-26 NOTE — Progress Notes (Signed)
Writer spoke with patient 1:1 and she reports that she has no one and feels that people seem to betray her. She reported that she is working on replacing negative thoughts with positive. She rpoerts that she is also working on forgiving others and herself. She has been observed up in the dayroom working puzzles in her book. Writer informed her of medications scheduled and she requested to take them around 2230. Safety maintained on unit with 15 min checks.

## 2018-09-26 NOTE — Plan of Care (Signed)
Nurse discussed anxiety, depression, coping skills with patient. 

## 2018-09-26 NOTE — Progress Notes (Signed)
D:  Patient's self inventory sheet, patient has poor sleep, sleep medication does help "alittle".  Fair appetite, low energy level, poor concentration.  Rated depression and hopeless 12.  Denied withdrawals.  Denied SI.  Physical problems, headaches, pain, sciatica. Pain medication not helpful.  Goal is "making it through the day (I'm numb and I contract for safety)l"  No discharge plans. A:  Medications administered per MD orders.  Emotional support and encouragement given patient.  Safety maintained with 15 minute checks.

## 2018-09-26 NOTE — Progress Notes (Signed)
Writer had been off the unit doing an admission and when returning on the unit to show patient their room a mht was sitting with patient because she reported that she could not contract for safety. Patient reported that she got bad news from a phone call. Her ex-boyfriend informed her that she couldn't return to previous place she was living and he was packing her belongings. She reported that she was unable to contract. She was observed by Clinical research associate until patient was able to contract for safety. Support given and safety maintained on unit  with 15 min checks.
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
## 2018-09-27 MED ORDER — PANTOPRAZOLE SODIUM 40 MG PO TBEC
40.0000 mg | DELAYED_RELEASE_TABLET | Freq: Every day | ORAL | Status: DC
  Administered 2018-09-27 – 2018-10-06 (×10): 40 mg via ORAL
  Filled 2018-09-27 (×5): qty 1
  Filled 2018-09-27: qty 7
  Filled 2018-09-27 (×9): qty 1

## 2018-09-27 MED ORDER — GABAPENTIN 400 MG PO CAPS
400.0000 mg | ORAL_CAPSULE | Freq: Three times a day (TID) | ORAL | Status: DC
  Administered 2018-09-27 – 2018-09-29 (×6): 400 mg via ORAL
  Filled 2018-09-27 (×9): qty 1

## 2018-09-27 MED ORDER — DULOXETINE HCL 60 MG PO CPEP
60.0000 mg | ORAL_CAPSULE | Freq: Every day | ORAL | Status: DC
  Administered 2018-09-28 – 2018-10-06 (×9): 60 mg via ORAL
  Filled 2018-09-27: qty 1
  Filled 2018-09-27: qty 7
  Filled 2018-09-27 (×11): qty 1

## 2018-09-27 MED ORDER — DULOXETINE HCL 20 MG PO CPEP
20.0000 mg | ORAL_CAPSULE | Freq: Once | ORAL | Status: AC
  Administered 2018-09-27: 20 mg via ORAL
  Filled 2018-09-27 (×2): qty 1

## 2018-09-27 MED ORDER — TRAMADOL HCL 50 MG PO TABS
50.0000 mg | ORAL_TABLET | Freq: Four times a day (QID) | ORAL | Status: DC | PRN
  Administered 2018-09-27 – 2018-10-04 (×14): 50 mg via ORAL
  Filled 2018-09-27 (×14): qty 1
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
## 2018-09-27 NOTE — Progress Notes (Signed)
D    Pt is visible on the milieu and her behavior is appropriate   She endorses depression and anxiety   She interacts well with others  A   Verbal support given   Medications administered and effectiveness monitored   Q 15 min checks  R   Pt is safe at this time

## 2018-09-27 NOTE — Progress Notes (Signed)
Recreation Therapy Notes  Date:  2.3.20 Time: 0930 Location: [address] Dayroom  Group Topic: Stress Management  Goal Area(s) Addresses:  Patient will identify positive stress management techniques. Patient will identify benefits of using stress management post d/c.  Intervention:  Stress Management  Activity :  Guided Imagery.  LRT introduced the stress management technique of guided imagery.  LRT read a script that took patients on a journey to the beach at sunrise.  Patients were to listen and follow along as script was read by LRT to engage in activity.  Education:  Stress Management, Discharge Planning.   Education Outcome: Acknowledges Education  Clinical Observations/Feedback: Pt did not attend group.      Caroll RancherMarjette Obe Ahlers, LRT/CTRS         Lillia AbedLindsay, Mattheus Rauls A 09/27/2018 11:01 AM
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
## 2018-09-27 NOTE — Progress Notes (Signed)
[facility]  09/27/2018 12:56 PM Christina [name]  MRN:  [id number] Subjective: "I'm taking it one second at a time instead of one day at a time."  Christina [name] sitting in dayroom. Presents with flat affect initially but becomes tearful while talking. States "my heart aches" due to boyfriend breaking up with her and moving her belongings out of their place this weekend. She reports anxiety related to not knowing where she will live after discharge because she does not have income. Her boyfriend's mother is looking for a place for her to stay and will help her financially. She reports wanting her boyfriend and others who have hurt her to feel emotional pain but she denies thoughts of physically hurting them. Reports persistently depressed mood with crying and low energy. Denies SI. States talking to staff has helped some. Sleep is fair. Denies medication side effects. Complains of chronic back pain.  From admission H&P: Patient is a 48 year old female with a past psychiatric history significant for possible bipolar disorder, major depression and anxiety. [facility] until 4 months ago when she lost her insurance. [facility], and they sent her to the emergency department secondary to suicidal ideation. The patient stated she had not been on medications in a while. She stated that previously she had had medication, and it would initially work, but then would stop. She complained of musculoskeletal pain. She also reported problems with relationships with a boyfriend, and the death of her mother who died 6 months ago. She has previously been treated with lithium, Depakote, Latuda, gabapentin, Topamax, Risperdal.   Principal Problem: <principal problem not specified> Diagnosis: Active Problems:   MDD (major depressive disorder), recurrent episode, severe (HCC)  Total Time spent with patient:  15 minutes  Past Psychiatric History: See admission H&P  Past Medical History:  Past Medical History:  Diagnosis Date  . Anxiety   . Back pain   . 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 INTRAVASCULAR ULTRASOUND/IVUS Left 11/03/2016   Procedure: Intravascular Ultrasound/IVUS;  Surgeon: Maeola HarmanBrandon Christopher Cain, MD;  [facility];  Service: Cardiovascular;  Laterality: Left;  . LOWER EXTREMITY VENOGRAPHY Left 11/04/2016   Procedure: Lower Extremity Venography;  Surgeon: Nada LibmanVance W Brabham, MD;  [facility];  Service: Cardiovascular;  Laterality: Left;  . PERIPHERAL VASCULAR INTERVENTION Left 11/04/2016   Procedure: Peripheral Vascular Intervention;  Surgeon: Nada LibmanVance W Brabham, MD;  [facility];  Service: Cardiovascular;  Laterality: Left;  common external  . PERIPHERAL VASCULAR THROMBECTOMY Left 11/03/2016   Procedure: Peripheral Vascular Thrombectomy;  Surgeon: Maeola HarmanBrandon Christopher Cain, MD;  [facility];  Service: Cardiovascular;  Laterality: Left;   Family History: History reviewed. No pertinent family history. Family Psychiatric  History: See admission H&P Social History:  Social History   Substance and Sexual Activity  Alcohol Use No     Social History   Substance and Sexual Activity  Drug Use No    Social History   Socioeconomic History  . Marital status: Divorced    Spouse name: Not on file  . Number of children: Not on file  . Years of education: Not on file  . Highest education level: Not on file  Occupational History  . Not on file  Social Needs  .  Financial resource strain: Very hard  . Food insecurity:    Worry: Sometimes true    Inability: Sometimes true  . Transportation needs:    Medical: No    Non-medical: No  Tobacco Use  . Smoking status: Current Every Day Smoker    Packs/day: 0.25    Types: Cigarettes  .  Smokeless tobacco: Never Used  Substance and Sexual Activity  . Alcohol use: No  . Drug use: No  . Sexual activity: Yes    Birth control/protection: Other-see comments    Comment: Tubal ligation  Lifestyle  . Physical activity:    Days per week: 2 days    Minutes per session: 10 min  . Stress: Very much  Relationships  . Social connections:    Talks on phone: Three times a week    Gets together: More than three times a week    Attends religious service: Never    Active member of club or organization: No    Attends meetings of clubs or organizations: Never    Relationship status: Divorced  Other Topics Concern  . Not on file  Social History Narrative  . Not on file   Additional Social History:                         Sleep: Fair  Appetite:  Fair  Current Medications: Current Facility-Administered Medications  Medication Dose Route Frequency Provider Last Rate Last Dose  . acetaminophen (TYLENOL) tablet 650 mg  650 mg Oral Q6H PRN Kerry Hough, PA-C   650 mg at 09/26/18 1823  . alum & mag hydroxide-simeth (MAALOX/MYLANTA) 200-200-20 MG/5ML suspension 30 mL  30 mL Oral Q4H PRN Kerry Hough, PA-C      . [START ON 09/28/2018] DULoxetine (CYMBALTA) DR capsule 60 mg  60 mg Oral Daily Antonieta Pert, MD      . gabapentin (NEURONTIN) capsule 400 mg  400 mg Oral TID Antonieta Pert, MD   400 mg at 09/27/18 1131  . hydrOXYzine (ATARAX/VISTARIL) tablet 50 mg  50 mg Oral Q6H PRN Antonieta Pert, MD   50 mg at 09/26/18 1713  . magnesium hydroxide (MILK OF MAGNESIA) suspension 30 mL  30 mL Oral Daily PRN Donell Sievert E, PA-C      . nicotine (NICODERM CQ - dosed in mg/24 hours) patch 21 mg  21 mg Transdermal Daily Antonieta Pert, MD   21 mg at 09/27/18 0914  . pantoprazole (PROTONIX) EC tablet 40 mg  40 mg Oral Daily Antonieta Pert, MD   40 mg at 09/27/18 1131  . prazosin (MINIPRESS) capsule 1 mg  1 mg Oral QHS Antonieta Pert, MD   1 mg at 09/26/18  2229  . QUEtiapine (SEROQUEL) tablet 300 mg  300 mg Oral QHS Aldean Baker, NP   300 mg at 09/26/18 2229  . rivaroxaban (XARELTO) tablet 20 mg  20 mg Oral Q supper Aldean Baker, NP   20 mg at 09/26/18 1710  . traMADol (ULTRAM) tablet 50 mg  50 mg Oral Q6H PRN Antonieta Pert, MD   50 mg at 09/27/18 1130  . traZODone (DESYREL) tablet 100 mg  100 mg Oral QHS,MR X 1 Kerry Hough, PA-C   100 mg at 09/26/18 2229    Lab Results: No results found for this or any previous visit (from the past 48 hour(s)).  Blood Alcohol level:  Lab Results  Component Value Date  ETH <10 09/22/2018    Metabolic Disorder Labs: No results found for: HGBA1C, MPG No results found for: PROLACTIN No results found for: CHOL, TRIG, HDL, CHOLHDL, VLDL, LDLCALC  Physical Findings: AIMS: Facial and Oral Movements Muscles of Facial Expression: None, normal Lips and Perioral Area: None, normal Jaw: None, normal Tongue: None, normal,Extremity Movements Upper (arms, wrists, hands, fingers): None, normal Lower (legs, knees, ankles, toes): None, normal, Trunk Movements Neck, shoulders, hips: None, normal, Overall Severity Severity of abnormal movements (highest score from questions above): None, normal Incapacitation due to abnormal movements: None, normal Patient's awareness of abnormal movements (rate only patient's report): No Awareness, Dental Status Current problems with teeth and/or dentures?: No Does patient usually wear dentures?: No  CIWA:  CIWA-Ar Total: 1 COWS:  COWS Total Score: 3  Musculoskeletal: Strength & Muscle Tone: within normal limits Gait & Station: normal Patient leans: N/A  Psychiatric Specialty Exam: Physical Exam  Nursing note and vitals reviewed. Constitutional: She is oriented to person, place, and time. She appears well-developed and well-nourished.  Cardiovascular: Normal rate.  Respiratory: Effort normal.  Neurological: She is alert and oriented to person, place, and  time.    Review of Systems  Constitutional: Negative.   Psychiatric/Behavioral: Positive for depression and substance abuse (UDS +THC). Negative for hallucinations, memory loss and suicidal ideas. The patient is not nervous/anxious and does not have insomnia.     Blood pressure 128/87, pulse 85, temperature 98.2 F (36.8 C), temperature source Oral, resp. rate 18, height 5\' 6"  (1.676 m), weight 70.8 kg, last menstrual period 09/22/2018.Body mass index is 25.18 kg/m.  General Appearance: Fairly Groomed  Eye Contact:  Fair  Speech:  Normal Rate  Volume:  Decreased  Mood:  Depressed  Affect:  Congruent and Tearful  Thought Process:  Coherent  Orientation:  Full (Time, Place, and Person)  Thought Content:  WDL  Suicidal Thoughts:  No  Homicidal Thoughts:  No  Memory:  Immediate;   Good Recent;   Fair  Judgement:  Fair  Insight:  Fair  Psychomotor Activity:  Normal  Concentration:  Concentration: Fair  Recall:  [employer] of Knowledge:  Fair  Language:  Good  Akathisia:  No  Handed:  Right  AIMS (if indicated):     Assets:  Communication Skills Desire for Improvement Resilience Social Support  ADL's:  Intact  Cognition:  WNL  Sleep:  Number of Hours: 5.5     Treatment Plan Summary: Daily contact with patient to assess and evaluate symptoms and progress in treatment and Medication management   Continue inpatient hospitalization.  Increase Cymbalta to 60 mg PO daily for depression Increase Gabapentin to 400 mg PO TID for agitation/pain Continue Vistaril 50 mg PO Q6HR PRN anxiety Continue Prazosin 1 mg PO QHS for nightmares Continue Seroquel 300 mg PO QHS for mood Continue Trazodone 100 mg PO QHS for insomnia Continue Xarelto 20 mg PO daily for DVT  Patient will participate in the therapeutic group milieu.  Discharge disposition in progress.   Aldean Baker, NP 09/27/2018, 12:56 PM

## 2018-09-27 NOTE — Plan of Care (Signed)
Progress note  D: pt found in the dayroom; compliant with medication. Pt states she is having lower back pain that she rates at an 8-10/10. Pt is pained in her assignment. Pt denies any si/hi/ah/vh and verbally agrees to approach staff if these become apparent or before harming herself or others while at [facility]. Pt is pleasant on the unit.  A: pt provided support and encouragement. Pt given medication per protocol and standing orders. Q55m safety checks implemented and continued.  R: pt safe on the unit. Will continue to monitor.   Pt progressing in the following metrics  Problem: Coping: Goal: Ability to demonstrate self-control will improve Outcome: Progressing   Problem: Health Behavior/Discharge Planning: Goal: Identification of resources available to assist in meeting health care needs will improve Outcome: Progressing Goal: Compliance with treatment plan for underlying cause of condition will improve Outcome: Progressing   Problem: Physical Regulation: Goal: Ability to maintain clinical measurements within normal limits will improve Outcome: Progressing

## 2018-09-28 MED ORDER — LIDOCAINE 5 % EX PTCH
1.0000 | MEDICATED_PATCH | Freq: Every day | CUTANEOUS | Status: DC | PRN
  Administered 2018-09-28: 1 via TRANSDERMAL
  Filled 2018-09-28 (×2): qty 1

## 2018-09-28 NOTE — Plan of Care (Signed)
Progress note  D: pt found in the dayroom; compliant with medication administration. Pt states she slept fair. Pt rates her depression/hopelessness/anxiety 8/8/8 out of 10 respectively. Pt complains of pain in her lower back that she rates at a 9/10. Pt states her goal for today is to get through the day and she will achieve this by praying. Pt denies si/hi/ah/vh and verbally agrees to approach staff if these become apparent or before harming herself or others while at [facility].  A: pt provided support and encouragement. Pt given medication per protocol and standing orders. q72m safety checks implemented and continued.  R: pt safe on the unit. Will continue to monitor.   Pt progressing in the following metrics  Problem: Safety: Goal: Periods of time without injury will increase Outcome: Progressing   Problem: Education: Goal: Utilization of techniques to improve thought processes will improve Outcome: Progressing Goal: Knowledge of the prescribed therapeutic regimen will improve Outcome: Progressing   Problem: Activity: Goal: Interest or engagement in leisure activities will improve Outcome: Progressing

## 2018-09-28 NOTE — Progress Notes (Signed)
Patient attended grief and loss group facilitated by Burnis KingfisherMatthew Stalnaker, Mdiv, BCC, and Ethel Ranaaroline Kimela Malstrom, MS, [facility], N[facility].   Group focuses on change and loss experienced by group members; topics include loss due to death, loss of relationships, loss of a place, loss of sense of self, etc. Group members are invited to share the changes and losses that are currently affecting their lives. Facilitators tie together shared experiences between group members and validate emotions felt by participants.  Patient Christina [name] attended and participated in group. Pt discussed a recent loss of a relationship and felt like her faith was shifted in the process. Pt affirmed her worth and stated she deserved different than what her relationship had given her. Pt reported feeling like she had renewed her faith over the past few days. Pt engaged with other patients and connected her experiences to theirs.

## 2018-09-28 NOTE — Progress Notes (Signed)
Patient described her day as a 4 out of 10. She states that pet therapy was the highlight of her day and that the rest of her day felt like a "roller coaster" of emotions. Her goal for tomorrow is to address her emotions.

## 2018-09-28 NOTE — Progress Notes (Signed)
Recreation Therapy Notes  Animal-Assisted Activity (AAA) Program Checklist/Progress Notes Patient Eligibility Criteria Checklist & Daily Group note for Rec Tx Intervention  Date: 2.4.20 Time: 1430 Location: [address]   AAA/T Program Assumption of Risk Form signed by Engineer, production or Parent Legal Guardian  YES   Patient is free of allergies or sever asthma   YES   Patient reports no fear of animals  YES   Patient reports no history of cruelty to animals  YES   Patient understands his/her participation is voluntary  YES   Patient washes hands before animal contact  YES   Patient washes hands after animal contact  YES   Behavioral Response: Engaged  Education: Charity fundraiser, Appropriate Animal Interaction   Education Outcome: Acknowledges understanding/In group clarification offered/Needs additional education.   Clinical Observations/Feedback: Pt attended and participated in group.    Caroll Rancher, LRT/CTRS         Caroll Rancher A 09/28/2018 3:46 PM

## 2018-09-28 NOTE — Progress Notes (Addendum)
[facility] MD Progress Note  09/28/2018 11:23 AM Khalyn Lab  MRN:  [id number] Subjective:  "I feel mentally messed up and my back hurts."  Ms. Corporan sitting in the dayroom. Continues to appear depressed, reporting sadness and depression. Rates mood 3/10 with 10 being best mood. States she feels angry, tired, and frustrated as well as depressed. Denies SI. She is participating in some groups. Sleep is "ok." Appetite is "up and down." She did have a good visit with friends last night. Cymbalta and gabapentin were increased yesterday. She continues to report chronic back pain. Denies trying Lidoderm patches before.  From admission H&P: Patient is a 48 year old female with a past psychiatric history significant for possible bipolar disorder, major depression and anxiety. She was previously seen at [facility] behavioral health as an outpatient until 4 months ago when she lost her insurance. She had her first appointment at [facility] on the day of admission, and they sent her to the emergency department secondary to suicidal ideation. The patient stated she had not been on medications in a while. She stated that previously she had had medication, and it would initially work, but then would stop. She complained of musculoskeletal pain. She also reported problems with relationships with a boyfriend, and the death of her mother who died 6 months ago. She has previously been treated with lithium, Depakote, Latuda, gabapentin, Topamax, Risperdal.   Principal Problem: MDD (major depressive disorder), recurrent episode, severe (HCC) Diagnosis: Principal Problem:   MDD (major depressive disorder), recurrent episode, severe (HCC)  Total Time spent with patient: 15 minutes  Past Psychiatric History: See admission H&P  Past Medical History:  Past Medical History:  Diagnosis Date  . Anxiety   . Back pain   . DVT (deep venous thrombosis) (HCC)    left leg with stent    Past Surgical History:  Procedure Laterality  Date  . INTRAVASCULAR ULTRASOUND/IVUS Left 11/03/2016   Procedure: Intravascular Ultrasound/IVUS;  Surgeon: Maeola Harman, MD;  Location: [facility] INVASIVE CV LAB;  Service: Cardiovascular;  Laterality: Left;  . LOWER EXTREMITY VENOGRAPHY Left 11/04/2016   Procedure: Lower Extremity Venography;  Surgeon: Nada Libman, MD;  Location: [facility] INVASIVE CV LAB;  Service: Cardiovascular;  Laterality: Left;  . PERIPHERAL VASCULAR INTERVENTION Left 11/04/2016   Procedure: Peripheral Vascular Intervention;  Surgeon: Nada Libman, MD;  Location: [facility] INVASIVE CV LAB;  Service: Cardiovascular;  Laterality: Left;  common external  . PERIPHERAL VASCULAR THROMBECTOMY Left 11/03/2016   Procedure: Peripheral Vascular Thrombectomy;  Surgeon: Maeola Harman, MD;  Location: [facility] INVASIVE CV LAB;  Service: Cardiovascular;  Laterality: Left;   Family History: History reviewed. No pertinent family history. Family Psychiatric  History: See admission H&P Social History:  Social History   Substance and Sexual Activity  Alcohol Use No     Social History   Substance and Sexual Activity  Drug Use No    Social History   Socioeconomic History  . Marital status: Divorced    Spouse name: Not on file  . Number of children: Not on file  . Years of education: Not on file  . Highest education level: Not on file  Occupational History  . Not on file  Social Needs  . Financial resource strain: Very hard  . Food insecurity:    Worry: Sometimes true    Inability: Sometimes true  . Transportation needs:    Medical: No    Non-medical: No  Tobacco Use  . Smoking status: Current Every Day Smoker  Packs/day: 0.25    Types: Cigarettes  . Smokeless tobacco: Never Used  Substance and Sexual Activity  . Alcohol use: No  . Drug use: No  . Sexual activity: Yes    Birth control/protection: Other-see comments    Comment: Tubal ligation  Lifestyle  . Physical activity:    Days per week: 2 days    Minutes  per session: 10 min  . Stress: Very much  Relationships  . Social connections:    Talks on phone: Three times a week    Gets together: More than three times a week    Attends religious service: Never    Active member of club or organization: No    Attends meetings of clubs or organizations: Never    Relationship status: Divorced  Other Topics Concern  . Not on file  Social History Narrative  . Not on file   Additional Social History:                         Sleep: Fair  Appetite:  Fair  Current Medications: Current Facility-Administered Medications  Medication Dose Route Frequency Provider Last Rate Last Dose  . acetaminophen (TYLENOL) tablet 650 mg  650 mg Oral Q6H PRN Kerry HoughSimon, Spencer E, PA-C   650 mg at 09/26/18 1823  . alum & mag hydroxide-simeth (MAALOX/MYLANTA) 200-200-20 MG/5ML suspension 30 mL  30 mL Oral Q4H PRN Kerry HoughSimon, Spencer E, PA-C      . DULoxetine (CYMBALTA) DR capsule 60 mg  60 mg Oral Daily Antonieta Pertlary, Greg Lawson, MD   60 mg at 09/28/18 0743  . gabapentin (NEURONTIN) capsule 400 mg  400 mg Oral TID Antonieta Pertlary, Greg Lawson, MD   400 mg at 09/28/18 0743  . hydrOXYzine (ATARAX/VISTARIL) tablet 50 mg  50 mg Oral Q6H PRN Antonieta Pertlary, Greg Lawson, MD   50 mg at 09/26/18 1713  . lidocaine (LIDODERM) 5 % 1 patch  1 patch Transdermal Daily PRN Aldean BakerSykes, Janet E, NP      . magnesium hydroxide (MILK OF MAGNESIA) suspension 30 mL  30 mL Oral Daily PRN Donell SievertSimon, Spencer E, PA-C      . nicotine (NICODERM CQ - dosed in mg/24 hours) patch 21 mg  21 mg Transdermal Daily Antonieta Pertlary, Greg Lawson, MD   Stopped at 09/28/18 [phone]  . pantoprazole (PROTONIX) EC tablet 40 mg  40 mg Oral Daily Antonieta Pertlary, Greg Lawson, MD   40 mg at 09/28/18 0743  . prazosin (MINIPRESS) capsule 1 mg  1 mg Oral QHS Antonieta Pertlary, Greg Lawson, MD   1 mg at 09/27/18 2212  . QUEtiapine (SEROQUEL) tablet 300 mg  300 mg Oral QHS Aldean BakerSykes, Janet E, NP   300 mg at 09/27/18 2212  . rivaroxaban (XARELTO) tablet 20 mg  20 mg Oral Q supper Aldean BakerSykes, Janet  E, NP   20 mg at 09/27/18 1734  . traMADol (ULTRAM) tablet 50 mg  50 mg Oral Q6H PRN Antonieta Pertlary, Greg Lawson, MD   50 mg at 09/28/18 0743  . traZODone (DESYREL) tablet 100 mg  100 mg Oral QHS,MR X 1 Kerry HoughSimon, Spencer E, PA-C   100 mg at 09/27/18 2212    Lab Results: No results found for this or any previous visit (from the past 48 hour(s)).  Blood Alcohol level:  Lab Results  Component Value Date   ETH <10 09/22/2018    Metabolic Disorder Labs: No results found for: HGBA1C, MPG No results found for: PROLACTIN No results found for: CHOL, TRIG,  HDL, CHOLHDL, VLDL, LDLCALC  Physical Findings: AIMS: Facial and Oral Movements Muscles of Facial Expression: None, normal Lips and Perioral Area: None, normal Jaw: None, normal Tongue: None, normal,Extremity Movements Upper (arms, wrists, hands, fingers): None, normal Lower (legs, knees, ankles, toes): None, normal, Trunk Movements Neck, shoulders, hips: None, normal, Overall Severity Severity of abnormal movements (highest score from questions above): None, normal Incapacitation due to abnormal movements: None, normal Patient's awareness of abnormal movements (rate only patient's report): No Awareness, Dental Status Current problems with teeth and/or dentures?: No Does patient usually wear dentures?: No  CIWA:  CIWA-Ar Total: 1 COWS:  COWS Total Score: 3  Musculoskeletal: Strength & Muscle Tone: within normal limits Gait & Station: normal Patient leans: N/A  Psychiatric Specialty Exam: Physical Exam  Nursing note and vitals reviewed. Constitutional: She is oriented to person, place, and time. She appears well-developed and well-nourished.  Cardiovascular: Normal rate.  Respiratory: Effort normal.  Neurological: She is alert and oriented to person, place, and time.    Review of Systems  Constitutional: Negative.   Psychiatric/Behavioral: Positive for depression and substance abuse (UDS +THC). Negative for hallucinations, memory loss  and suicidal ideas. The patient is not nervous/anxious and does not have insomnia.     Blood pressure 113/74, pulse 82, temperature 98.2 F (36.8 C), temperature source Oral, resp. rate 18, height 5\' 6"  (1.676 m), weight 70.8 kg, last menstrual period 09/22/2018.Body mass index is 25.18 kg/m.  General Appearance: Fairly Groomed  Eye Contact:  Poor  Speech:  Normal Rate  Volume:  Decreased  Mood:  Depressed  Affect:  Congruent  Thought Process:  Coherent  Orientation:  Full (Time, Place, and Person)  Thought Content:  WDL  Suicidal Thoughts:  No  Homicidal Thoughts:  No  Memory:  Immediate;   Good Recent;   Good  Judgement:  Fair  Insight:  Fair  Psychomotor Activity:  Normal  Concentration:  Concentration: Good  Recall:  Good  Fund of Knowledge:  Fair  Language:  Good  Akathisia:  No  Handed:  Right  AIMS (if indicated):     Assets:  Communication Skills Desire for Improvement Resilience Social Support  ADL's:  Intact  Cognition:  WNL  Sleep:  Number of Hours: 6.25     Treatment Plan Summary: Daily contact with patient to assess and evaluate symptoms and progress in treatment and Medication management   Continue inpatient hospitalization.  Start lidocaine 5% patch daily PRN back pain, remove after 12 hours Continue Tylenol 650 mg PO Q6HR PRN pain Continue Cymbalta to 60 mg PO daily for depression Continue Gabapentin to 400 mg PO TID for agitation/pain Continue Vistaril 50 mg PO Q6HR PRN anxiety Continue Prazosin 1 mg PO QHS for nightmares Continue Seroquel 300 mg PO QHS for mood Continue Trazodone 100 mg PO QHS for insomnia Continue Xarelto 20 mg PO daily for DVT  Patient will participate in the therapeutic group milieu.  Discharge disposition in progress.   Aldean Baker, NP 09/28/2018, 11:23 AM    ..Marland KitchenAgree with NP Progress Note

## 2018-09-29 MED ORDER — GABAPENTIN 300 MG PO CAPS
600.0000 mg | ORAL_CAPSULE | Freq: Three times a day (TID) | ORAL | Status: DC
  Administered 2018-09-29 – 2018-10-03 (×12): 600 mg via ORAL
  Filled 2018-09-29 (×17): qty 2

## 2018-09-29 MED ORDER — QUETIAPINE FUMARATE 400 MG PO TABS
400.0000 mg | ORAL_TABLET | Freq: Every day | ORAL | Status: DC
  Administered 2018-09-29 – 2018-09-30 (×2): 400 mg via ORAL
  Filled 2018-09-29 (×3): qty 1

## 2018-09-29 MED ORDER — TRAZODONE HCL 50 MG PO TABS
50.0000 mg | ORAL_TABLET | Freq: Every evening | ORAL | Status: DC | PRN
  Administered 2018-09-29 – 2018-10-05 (×7): 50 mg via ORAL
  Filled 2018-09-29 (×5): qty 1
  Filled 2018-09-29: qty 7
  Filled 2018-09-29 (×2): qty 1

## 2018-09-29 NOTE — [facility] Group Notes (Signed)
[facility] Group Notes:  (Nursing/MHT/Case Management/Adjunct)  Date:  09/29/2018  Time:  3:15 pm  Type of Therapy:  Psychoeducational Skills  Participation Level:  Active  Participation Quality:  Appropriate  Affect:  Appropriate  Cognitive:  Appropriate  Insight:  Appropriate  Engagement in Group:  Engaged  Modes of Intervention:  Education  Summary of Progress/Problems: Patient alert and involved in group.    Quintella Reichert [place] 09/29/2018, 8:07 PM

## 2018-09-29 NOTE — Tx Team (Signed)
Interdisciplinary Treatment and Diagnostic Plan Update  09/29/2018 Time of Session:  Christina [name] MRN: [id number]  Principal Diagnosis: MDD (major depressive disorder), recurrent episode, severe (HCC)  Secondary Diagnoses: Principal Problem:   MDD (major depressive disorder), recurrent episode, severe (HCC)   Current Medications:  Current Facility-Administered Medications  Medication Dose Route Frequency Provider Last Rate Last Dose  . acetaminophen (TYLENOL) tablet 650 mg  650 mg Oral Q6H PRN Kerry HoughSimon, Spencer E, PA-C   650 mg at 09/26/18 1823  . alum & mag hydroxide-simeth (MAALOX/MYLANTA) 200-200-20 MG/5ML suspension 30 mL  30 mL Oral Q4H PRN Kerry HoughSimon, Spencer E, PA-C      . DULoxetine (CYMBALTA) DR capsule 60 mg  60 mg Oral Daily Antonieta Pertlary, Greg Lawson, MD   60 mg at 09/29/18 0739  . gabapentin (NEURONTIN) capsule 600 mg  600 mg Oral TID Cobos, Fernando A, MD      . hydrOXYzine (ATARAX/VISTARIL) tablet 50 mg  50 mg Oral Q6H PRN Antonieta Pertlary, Greg Lawson, MD   50 mg at 09/26/18 1713  . lidocaine (LIDODERM) 5 % 1 patch  1 patch Transdermal Daily PRN Aldean BakerSykes, Janet E, NP   1 patch at 09/28/18 1715  . magnesium hydroxide (MILK OF MAGNESIA) suspension 30 mL  30 mL Oral Daily PRN Donell SievertSimon, Spencer E, PA-C      . nicotine (NICODERM CQ - dosed in mg/24 hours) patch 21 mg  21 mg Transdermal Daily Antonieta Pertlary, Greg Lawson, MD   Stopped at 09/28/18 [phone]  . pantoprazole (PROTONIX) EC tablet 40 mg  40 mg Oral Daily Antonieta Pertlary, Greg Lawson, MD   40 mg at 09/29/18 0739  . prazosin (MINIPRESS) capsule 1 mg  1 mg Oral QHS Antonieta Pertlary, Greg Lawson, MD   1 mg at 09/28/18 2238  . QUEtiapine (SEROQUEL) tablet 400 mg  400 mg Oral QHS Cobos, Fernando A, MD      . rivaroxaban (XARELTO) tablet 20 mg  20 mg Oral Q supper Aldean BakerSykes, Janet E, NP   20 mg at 09/28/18 1720  . traMADol (ULTRAM) tablet 50 mg  50 mg Oral Q6H PRN Antonieta Pertlary, Greg Lawson, MD   50 mg at 09/29/18 0739  . traZODone (DESYREL) tablet 50 mg  50 mg Oral QHS PRN Cobos, Rockey SituFernando A, MD        PTA Medications: Medications Prior to Admission  Medication Sig Dispense Refill Last Dose  . acetaminophen (TYLENOL) 500 MG tablet Take 500 mg by mouth every 6 (six) hours as needed.   09/21/2018 at Unknown time  . acetaminophen (TYLENOL) 650 MG CR tablet Take 975 mg by mouth daily as needed for pain.   09/21/2018 at Unknown time  . amLODipine (NORVASC) 10 MG tablet Take 1 tablet (10 mg total) by mouth daily. (Patient not taking: Reported on 09/22/2018) 90 tablet 1 Not Taking at Unknown time  . carvedilol (COREG) 12.5 MG tablet Take 1 tablet (12.5 mg total) by mouth 2 (two) times daily with a meal. (Patient not taking: Reported on 09/22/2018) 90 tablet 1 Not Taking at Unknown time  . escitalopram (LEXAPRO) 20 MG tablet Take 1 tablet (20 mg total) by mouth daily. (Patient not taking: Reported on 09/22/2018) 90 tablet 0 Not Taking at Unknown time  . gabapentin (NEURONTIN) 300 MG capsule Take 2 capsules (600 mg total) by mouth 3 (three) times daily. (Patient not taking: Reported on 09/22/2018) 180 capsule 3 Not Taking at Unknown time  . HYDROcodone-acetaminophen (NORCO) 5-325 MG tablet Take 1 tablet by mouth at bedtime. (Patient not  taking: Reported on 09/22/2018) 14 tablet 0 Not Taking at Unknown time  . ibuprofen (ADVIL,MOTRIN) 200 MG tablet Take 600 mg by mouth daily as needed for moderate pain.   Past Week at Unknown time  . nitrofurantoin, macrocrystal-monohydrate, (MACROBID) 100 MG capsule Take 1 capsule (100 mg total) by mouth 2 (two) times daily. (Patient not taking: Reported on 09/22/2018) 10 capsule 0 Not Taking at Unknown time  . QUEtiapine (SEROQUEL) 200 MG tablet Take 1 tablet (200 mg total) by mouth at bedtime. (Patient not taking: Reported on 09/22/2018) 90 tablet 0 Not Taking at Unknown time  . rivaroxaban (XARELTO) 20 MG TABS tablet Take 1 tablet (20 mg total) by mouth daily with supper. (Patient not taking: Reported on 09/22/2018) 90 tablet 3 Not Taking at Unknown time  . SUMAtriptan (IMITREX)  25 MG tablet Take 1 tablet (25 mg total) by mouth daily. May take one more tablet two hours after the first. No more than two tablets per day. (Patient not taking: Reported on 09/22/2018) 9 tablet 1 Not Taking at Unknown time  . topiramate (TOPAMAX) 25 MG tablet TAKE 1 TABLET BY MOUTH 2 TIMES DAILY. (Patient not taking: Reported on 09/22/2018) 60 tablet 1 Not Taking at Unknown time    Patient Stressors: Health problems Marital or family conflict  Patient Strengths: Ability for insight Average or above average intelligence Capable of independent living Communication skills General fund of knowledge Motivation for treatment/growth  Treatment Modalities: Medication Management, Group therapy, Case management,  1 to 1 session with clinician, Psychoeducation, Recreational therapy.   Physician Treatment Plan for Primary Diagnosis: MDD (major depressive disorder), recurrent episode, severe (HCC) Long Term Goal(s): Improvement in symptoms so as ready for discharge Improvement in symptoms so as ready for discharge   Short Term Goals: Ability to identify changes in lifestyle to reduce recurrence of condition will improve Ability to verbalize feelings will improve Ability to disclose and discuss suicidal ideas Ability to demonstrate self-control will improve Ability to identify and develop effective coping behaviors will improve Ability to maintain clinical measurements within normal limits will improve Compliance with prescribed medications will improve Ability to identify triggers associated with substance abuse/mental health issues will improve Ability to identify changes in lifestyle to reduce recurrence of condition will improve Ability to verbalize feelings will improve Ability to disclose and discuss suicidal ideas Ability to demonstrate self-control will improve Ability to identify and develop effective coping behaviors will improve Ability to maintain clinical measurements within normal  limits will improve Compliance with prescribed medications will improve Ability to identify triggers associated with substance abuse/mental health issues will improve  Medication Management: Evaluate patient's response, side effects, and tolerance of medication regimen.  Therapeutic Interventions: 1 to 1 sessions, Unit Group sessions and Medication administration.  Evaluation of Outcomes: Progressing  Physician Treatment Plan for Secondary Diagnosis: Principal Problem:   MDD (major depressive disorder), recurrent episode, severe (HCC)  Long Term Goal(s): Improvement in symptoms so as ready for discharge Improvement in symptoms so as ready for discharge   Short Term Goals: Ability to identify changes in lifestyle to reduce recurrence of condition will improve Ability to verbalize feelings will improve Ability to disclose and discuss suicidal ideas Ability to demonstrate self-control will improve Ability to identify and develop effective coping behaviors will improve Ability to maintain clinical measurements within normal limits will improve Compliance with prescribed medications will improve Ability to identify triggers associated with substance abuse/mental health issues will improve Ability to identify changes in lifestyle to reduce  recurrence of condition will improve Ability to verbalize feelings will improve Ability to disclose and discuss suicidal ideas Ability to demonstrate self-control will improve Ability to identify and develop effective coping behaviors will improve Ability to maintain clinical measurements within normal limits will improve Compliance with prescribed medications will improve Ability to identify triggers associated with substance abuse/mental health issues will improve     Medication Management: Evaluate patient's response, side effects, and tolerance of medication regimen.  Therapeutic Interventions: 1 to 1 sessions, Unit Group sessions and Medication  administration.  Evaluation of Outcomes: Progressing   RN Treatment Plan for Primary Diagnosis: MDD (major depressive disorder), recurrent episode, severe (HCC) Long Term Goal(s): Knowledge of disease and therapeutic regimen to maintain health will improve  Short Term Goals: Ability to participate in decision making will improve, Ability to verbalize feelings will improve, Ability to disclose and discuss suicidal ideas, Ability to identify and develop effective coping behaviors will improve and Compliance with prescribed medications will improve  Medication Management: RN will administer medications as ordered by provider, will assess and evaluate patient's response and provide education to patient for prescribed medication. RN will report any adverse and/or side effects to prescribing provider.  Therapeutic Interventions: 1 on 1 counseling sessions, Psychoeducation, Medication administration, Evaluate responses to treatment, Monitor vital signs and CBGs as ordered, Perform/monitor CIWA, COWS, AIMS and Fall Risk screenings as ordered, Perform wound care treatments as ordered.  Evaluation of Outcomes: Progressing   LCSW Treatment Plan for Primary Diagnosis: MDD (major depressive disorder), recurrent episode, severe (HCC) Long Term Goal(s): Safe transition to appropriate next level of care at discharge, Engage patient in therapeutic group addressing interpersonal concerns.  Short Term Goals: Engage patient in aftercare planning with referrals and resources  Therapeutic Interventions: Assess for all discharge needs, 1 to 1 time with Social worker, Explore available resources and support systems, Assess for adequacy in community support network, Educate family and significant other(s) on suicide prevention, Complete Psychosocial Assessment, Interpersonal group therapy.  Evaluation of Outcomes: Progressing   Progress in Treatment: Attending groups: Yes. Participating in groups: Yes. Taking  medication as prescribed: Yes. Toleration medication: Yes. Family/Significant other contact made: No, will contact:  patient declined consent for collateral contacts Patient understands diagnosis: Yes. Discussing patient identified problems/goals with staff: Yes. Medical problems stabilized or resolved: Yes. Denies suicidal/homicidal ideation: Yes. Issues/concerns per patient self-inventory: No. Other:   New problem(s) identified: None   New Short Term/Long Term Goal(s): medication stabilization, elimination of SI thoughts, development of comprehensive mental wellness plan.    Patient Goals: I don't know what I need but I know I need help    Discharge Plan or Barriers: Patient reports that she is no longer returning to her ex-boyfriend's home. The patient states that she does not have a plan for housing at this time. CSW provided additional housing resources. The patient plans to follow up with [facility] for outpatient medication management and therapy services. CSW will continue to follow and assess for additional referrals and discharge planning.   Reason for Continuation of Hospitalization: Depression Medication stabilization Suicidal ideation  Estimated Length of Stay: 10/01/2018  Attendees: Patient: 09/29/2018 10:50 AM  Physician: Dr. Landry Mellow, MD; Dr. Nehemiah Massed, MD 09/29/2018 10:50 AM  Nursing: Rayfield Citizen.B, RN; Casimiro Needle.Kathie Rhodes, RN 09/29/2018 10:50 AM  RN Care Manager: Onnie Boer, RN 09/29/2018 10:50 AM  Social Worker: Baldo Daub, LCSWA 09/29/2018 10:50 AM  Recreational Therapist:  09/29/2018 10:50 AM  Other: Marciano Sequin, NP  09/29/2018 10:50 AM  Other:  09/29/2018 10:50 AM  Other: 09/29/2018 10:50 AM    Scribe for Treatment Team: Maeola Sarah, LCSWA 09/29/2018 10:50 AM

## 2018-09-29 NOTE — Plan of Care (Signed)
Progress note  D: pt found in the dayroom interacting; compliant with medication administration. Pt states she didn't sleep well. Pt is still having chronic back pain that he rates at a 9/10. Pt is still hopeful for a new start though. Pt denies si/hi/ah/vh and verbally agrees to approach staff if these become apparent or before harming himself or others. A: pt provided support and encouragement. Pt given medication per protocol and standing orders. Q47m safety checks implemented and continued.  R: pt safe on the unit. Will continue to monitor.   Pt progressing in the following metrics  Problem: Coping: Goal: Coping ability will improve Outcome: Progressing Goal: Will verbalize feelings Outcome: Progressing   Problem: Health Behavior/Discharge Planning: Goal: Ability to make decisions will improve Outcome: Progressing Goal: Compliance with therapeutic regimen will improve Outcome: Progressing   Problem: Coping: Goal: Coping ability will improve Outcome: Progressing Goal: Will verbalize feelings Outcome: Progressing   Problem: Health Behavior/Discharge Planning: Goal: Ability to make decisions will improve Outcome: Progressing Goal: Compliance with therapeutic regimen will improve Outcome: Progressing

## 2018-09-29 NOTE — Progress Notes (Signed)
Patient attended group but refused to share.  

## 2018-09-29 NOTE — Plan of Care (Signed)
D: Patient is alert, oriented, and cooperative. Patient endorses passive SI. Patient denies HI, AVH, and verbally contracts for safety. Patient reports feeling anxious and depressed. Patient reports "I can't turn my brain off" "I feel like I'm on an emotional roller coaster" "I'm heart broken and exhausted". Patient reports insomnia. Patient reports chronic low back pain rated 9/10.    A: Scheduled medications administered per MD order. Support provided. Patient educated on safety on the unit and medications. Routine safety checks every 15 minutes. Patient stated understanding to tell nurse about any new physical symptoms. Patient understands to tell staff of any needs.     R: No adverse drug reactions noted. Patient verbally contracts for safety. Patient remains safe at this time and will continue to monitor.    Problem: Education: Goal: Knowledge of [facility] Education information/materials will improve Outcome: Progressing   Problem: Safety: Goal: Periods of time without injury will increase Outcome: Progressing   Patient oriented to the unit. Patient remains safe and will continue to monitor.

## 2018-09-29 NOTE — Progress Notes (Addendum)
[facility] MD Progress Note  09/29/2018 10:58 AM Laikyn Gewirtz  MRN:  [id number] Subjective: Reports persistent depression which she attributes at least partly to chronic back pain.  States she has suffered from lower back pain for years following an epidural procedure during the past pregnancy. She denies medication side effects at this time.  Expresses frustration regarding persistent lower back pain in spite of current medication regimen.  Denies suicidal plans  or intention, contracts for safety on unit.   Objective:  I have discussed case with treatment team and have met with patient .  48 year old female, history of mood disorder, has been diagnosed with bipolar disorder in the past.  Presented with worsening depression and suicidal ideations in the context of significant losses, including death of her mother 6 months ago and unstable relationship with boyfriend. She also endorses chronic lower back pain, which refers to lower extremities, as contributing stressor.  Today remains depressed, constricted in affect, denies suicidal ideations, contracts for safety on unit.  Does not endorse medication side effects, but feels medications are not addressing her mood or her chronic pain as much as she would hoped they would. As above, currently denies suicidal plan or intention, she has endorsed persistent/intermittent passive SI to nursing staff.  No disruptive or agitated behaviors on unit.   Principal Problem: MDD (major depressive disorder), recurrent episode, severe ([place]) Diagnosis: Principal Problem:   MDD (major depressive disorder), recurrent episode, severe ([place])  Total Time spent with patient: 20 minutes  Past Psychiatric History: See admission H&P  Past Medical History:  Past Medical History:  Diagnosis Date  . Anxiety   . Back pain   . 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 INTRAVASCULAR ULTRASOUND/IVUS  Left 11/03/2016   Procedure: Intravascular Ultrasound/IVUS;  Surgeon: Waynetta Sandy, MD;  Location: [facility];  Service: Cardiovascular;  Laterality: Left;  . LOWER EXTREMITY VENOGRAPHY Left 11/04/2016   Procedure: Lower Extremity Venography;  Surgeon: Serafina Mitchell, MD;  Location: [place] [facility];  Service: Cardiovascular;  Laterality: Left;  . PERIPHERAL VASCULAR INTERVENTION Left 11/04/2016   Procedure: Peripheral Vascular Intervention;  Surgeon: Serafina Mitchell, MD;  Location: [facility];  Service: Cardiovascular;  Laterality: Left;  common external  . PERIPHERAL VASCULAR THROMBECTOMY Left 11/03/2016   Procedure: Peripheral Vascular Thrombectomy;  Surgeon: Waynetta Sandy, MD;  Location: [place] [facility];  Service: Cardiovascular;  Laterality: Left;   Family History: History reviewed. No pertinent family history. Family Psychiatric  History: See admission H&P Social History:  Social History   Substance and Sexual Activity  Alcohol Use No     Social History   Substance and Sexual Activity  Drug Use No    Social History   Socioeconomic History  . Marital status: Divorced    Spouse name: Not on file  . Number of children: Not on file  . Years of education: Not on file  . Highest education level: Not on file  Occupational History  . Not on file  Social Needs  . Financial resource strain: Very hard  . Food insecurity:    Worry: Sometimes true    Inability: Sometimes true  . Transportation needs:    Medical: No    Non-medical: No  Tobacco Use  . Smoking status: Current Every Day Smoker    Packs/day: 0.25    Types: Cigarettes  . Smokeless tobacco:  Never Used  Substance and Sexual Activity  . Alcohol use: No  . Drug use: No  . Sexual activity: Yes    Birth control/protection: Other-see comments    Comment: Tubal ligation  Lifestyle  . Physical activity:    Days per week: 2 days    Minutes per session: 10 min  . Stress: Very  much  Relationships  . Social connections:    Talks on phone: Three times a week    Gets together: More than three times a week    Attends religious service: Never    Active member of club or organization: No    Attends meetings of clubs or organizations: Never    Relationship status: Divorced  Other Topics Concern  . Not on file  Social History Narrative  . Not on file   Additional Social History:   Sleep: Fair/improving  Appetite:  Fair  Current Medications: Current Facility-Administered Medications  Medication Dose Route Frequency Provider Last Rate Last Dose  . acetaminophen (TYLENOL) tablet 650 mg  650 mg Oral Q6H PRN Laverle Hobby, PA-C   650 mg at 09/26/18 1823  . alum & mag hydroxide-simeth (MAALOX/MYLANTA) 200-200-20 MG/5ML suspension 30 mL  30 mL Oral Q4H PRN Laverle Hobby, PA-C      . DULoxetine (CYMBALTA) DR capsule 60 mg  60 mg Oral Daily Sharma Covert, MD   60 mg at 09/29/18 0739  . gabapentin (NEURONTIN) capsule 600 mg  600 mg Oral TID Cobos, Fernando A, MD      . hydrOXYzine (ATARAX/VISTARIL) tablet 50 mg  50 mg Oral Q6H PRN Sharma Covert, MD   50 mg at 09/26/18 1713  . lidocaine (LIDODERM) 5 % 1 patch  1 patch Transdermal Daily PRN Connye Burkitt, NP   1 patch at 09/28/18 1715  . magnesium hydroxide (MILK OF MAGNESIA) suspension 30 mL  30 mL Oral Daily PRN Patriciaann Clan E, PA-C      . nicotine (NICODERM CQ - dosed in mg/24 hours) patch 21 mg  21 mg Transdermal Daily Sharma Covert, MD   Stopped at 09/28/18 [phone]  . pantoprazole (PROTONIX) EC tablet 40 mg  40 mg Oral Daily Sharma Covert, MD   40 mg at 09/29/18 0739  . prazosin (MINIPRESS) capsule 1 mg  1 mg Oral QHS Sharma Covert, MD   1 mg at 09/28/18 2238  . QUEtiapine (SEROQUEL) tablet 400 mg  400 mg Oral QHS Cobos, Fernando A, MD      . rivaroxaban (XARELTO) tablet 20 mg  20 mg Oral Q supper Connye Burkitt, NP   20 mg at 09/28/18 1720  . traMADol (ULTRAM) tablet 50 mg  50 mg Oral  Q6H PRN Sharma Covert, MD   50 mg at 09/29/18 0739  . traZODone (DESYREL) tablet 50 mg  50 mg Oral QHS PRN Cobos, Myer Peer, MD        Lab Results: No results found for this or any previous visit (from the past 48 hour(s)).  Blood Alcohol level:  Lab Results  Component Value Date   ETH <10 96/11/5407    Metabolic Disorder Labs: No results found for: HGBA1C, MPG No results found for: PROLACTIN No results found for: CHOL, TRIG, HDL, CHOLHDL, VLDL, LDLCALC  Physical Findings: AIMS: Facial and Oral Movements Muscles of Facial Expression: None, normal Lips and Perioral Area: None, normal Jaw: None, normal Tongue: None, normal,Extremity Movements Upper (arms, wrists, hands, fingers): None, normal Lower (legs,  knees, ankles, toes): None, normal, Trunk Movements Neck, shoulders, hips: None, normal, Overall Severity Severity of abnormal movements (highest score from questions above): None, normal Incapacitation due to abnormal movements: None, normal Patient's awareness of abnormal movements (rate only patient's report): No Awareness, Dental Status Current problems with teeth and/or dentures?: No Does patient usually wear dentures?: No  CIWA:  CIWA-Ar Total: 1 COWS:  COWS Total Score: 3  Musculoskeletal: Strength & Muscle Tone: within normal limits Gait & Station: normal Patient leans: N/A  Psychiatric Specialty Exam: Physical Exam  Nursing note and vitals reviewed. Constitutional: She is oriented to person, place, and time. She appears well-developed and well-nourished.  Cardiovascular: Normal rate.  Respiratory: Effort normal.  Neurological: She is alert and oriented to person, place, and time.    Review of Systems  Constitutional: Negative.   Psychiatric/Behavioral: Positive for depression and substance abuse (UDS +THC). Negative for hallucinations, memory loss and suicidal ideas. The patient is not nervous/anxious and does not have insomnia.   No chest pain, no  shortness of breath at room air, chronic lower back pain.  Of note, patient is on Xarelto for history of DVT.  Currently denies any bleeding or bruising  Blood pressure 105/76, pulse 92, temperature 99.1 F (37.3 C), temperature source Oral, resp. rate 18, height 5' 6"  (1.676 m), weight 70.8 kg, last menstrual period 09/22/2018.Body mass index is 25.18 kg/m.  General Appearance: Fairly Groomed  Eye Contact:  Improved eye contact  Speech:  Normal Rate  Volume:  Normal  Mood:  Remains depressed/sad  Affect:  Congruent and Constricted  Thought Process:  Linear and Descriptions of Associations: Intact  Orientation:  Full (Time, Place, and Person)  Thought Content:  No hallucinations, no delusions, not internally preoccupied  Suicidal Thoughts:  No-at this time does not endorse suicidal ideations, denies suicidal plan or intention  Homicidal Thoughts:  No  Memory:  Recent and remote grossly intact  Judgement:  Other:  Fair/improving  Insight:  Fair  Psychomotor Activity:  Decreased  Concentration:  Concentration: Good  Recall:  Good  Fund of Knowledge:  Good  Language:  Good  Akathisia:  No  Handed:  Right  AIMS (if indicated):     Assets:  Communication Skills Desire for Improvement Resilience Social Support  ADL's:  Intact  Cognition:  WNL  Sleep:  Number of Hours: 5.25   Assessment: 48 year old female, history of mood disorder, has been diagnosed with bipolar disorder in the past.  Presented with worsening depression and suicidal ideations in the context of significant losses, including death of her mother 6 months ago and unstable relationship with boyfriend. She also endorses chronic lower back pain, which refers to lower extremities, as contributing stressor.   Today patient remains depressed, vaguely dysphoric, sad.  Denies suicidal ideations/contracts for safety on unit but has endorsed intermittent passive SI to staff.  Denies medication side effects.  Reports chronic lower  back pain, and states she continues to experience pain in spite of current regimen.  Does not currently endorse medication side effects.  We reviewed options: Thus far tolerating Cymbalta/Neurontin/Seroquel well.  We will continue to gradually titrate Seroquel to address mood disorder/mood instability, titrate Neurontin further to help with chronic back pain and anxiety, continue Cymbalta trial.   Treatment Plan Summary: Daily contact with patient to assess and evaluate symptoms and progress in treatment and Medication management  Treatment plan reviewed as below today 2/5 Encourage group and milieu participation to work on coping skills and symptom reduction  Continue Lidocaine 5% patch daily PRN back pain, remove after 12 hours Continue Tylenol 650 mg  Q6HR PRN pain Continue Cymbalta 60 mg  daily for depression Increase Gabapentin to 600 mg PO TID for anxiety/pain Continue Vistaril 50 mg PO Q6HR PRN anxiety Continue Prazosin 1 mg PO QHS for nightmares Increase Seroquel to 400 mg PO QHS for mood Decrease  Trazodone to 50  mg PO QHS for insomnia Continue Xarelto 20 mg PO daily for DVT Treatment team working on disposition planning options Check HgbA1c / lipid panel / EKG to monitor QTc    Jenne Campus, MD 09/29/2018, 10:58 AM   Patient ID: Winfield Cunas, female   DOB: 1970-09-16, 48 y.o.   MRN: [id number]

## 2018-09-29 NOTE — Therapy (Signed)
Occupational Therapy Group Note  Date:  09/29/2018 Time:  11:20 AM  Group Topic/Focus:  Self Esteem Action Plan:   The focus of this group is to help patients create a plan to continue to build self-esteem after discharge.  Participation Level:  Active  Participation Quality:  Appropriate  Affect:  Flat  Cognitive:  Appropriate  Insight: Lacking  Engagement in Group:  Engaged  Modes of Intervention:  Activity, Discussion, Education and Support  Additional Comments:     S: "Stigma is a big contributor to lower self esteem"  O: OT tx with focus on self esteem building this date. Education given on definition of self esteem, with both causes of low and high self esteem identified. Activity given for pt to identify a positive/aspiring trait for each letter of the alphabet. Pt to work with peers to help complete activity and build positive thinking.   A: Pt presents to group with flat/depressed affect, originally not wanting to engage in group. Once conversation beginning with group members, pt began to warm up and participate appropriately with others. Pt shares that stigma and failures contribute to low self esteem. She also shares that a negative home environment can easily decrease her self esteem. Pt completing A-Z activity independently.   P: Education given on self esteem and how to improve this date. Handouts and activities given to help facilitate skills when reintegrating into community.   Dalphine HandingKaylee Kalief Kattner, MSOT, OTR/L Behavioral Health OT/ Acute Relief OT PHP Office: [phone]  Dalphine HandingKaylee Alonso Gapinski 09/29/2018, 11:20 AM

## 2018-09-30 LAB — HEMOGLOBIN A1C
Hgb A1c MFr Bld: 6 % — ABNORMAL HIGH (ref 4.8–5.6)
Mean Plasma Glucose: 125.5 mg/dL

## 2018-09-30 LAB — LIPID PANEL
Cholesterol: 195 mg/dL (ref 0–200)
HDL: 57 mg/dL (ref >40)
LDL Cholesterol: 117 mg/dL — ABNORMAL HIGH (ref 0–99)
Total CHOL/HDL Ratio: 3.4 RATIO
Triglycerides: 105 mg/dL (ref <150)
VLDL: 21 mg/dL (ref 0–40)

## 2018-09-30 LAB — TSH: TSH: 1.773 u[IU]/mL (ref 0.350–4.500)

## 2018-09-30 NOTE — Progress Notes (Signed)
D:  Patient's self inventory sheet, patient has poor sleep, no sleep medication.  Fair appetite, low energy level, OK concentration.  Rate depression, hopeless and anxiety 9.  Denied withdrawals.  Denied SI.  Physical problems, pain, back, buttocks, leg, sciatica, worst pain #8, pain medicine not helpful.  Goal is make it through day.  Plans to pray.  Does have discharge plans. A:  Medications administered per MD orders.  Emotional support and encouragement given patient. R:  Denied SI and HI, contracts for safety.  Denied A/V hallucinations.  Safety maintained with 15 minute checks.

## 2018-09-30 NOTE — [facility] Group Notes (Signed)
[facility] Group Therapy      09/30/2018 3:22 PM  Type of Therapy: Mental Health Association Presentation  Participation Level: Active  Participation Quality: Attentive  Affect: Appropriate  Cognitive: Oriented  Insight: Developing/Improving  Engagement in Therapy: Engaged  Modes of Intervention: Discussion, Education and Socialization  Summary of Progress/Problems: Mental Health Association (MHA) Speaker came to talk about his personal journey with mental health. The pt processed ways by which to relate to the speaker. MHA speaker provided handouts and educational information pertaining to groups and services offered by the [facility]. Pt was engaged in speaker's presentation and was receptive to resources provided.    Alcario Drought Clinical Social Worker

## 2018-09-30 NOTE — Progress Notes (Signed)
Pt sitting in the day room talking and laughing other pt. Christina [name] came in the day room complaint about the staff and the facility. This place is no good the staff do not care what happens to you. And they both began to find things to complain about. They talk loud enough so the other pt's could hear.Christina [name] said I can not wait until I leave this place. I do not know why I am here? This place is not helping me to recover.

## 2018-09-30 NOTE — Progress Notes (Signed)
VASCULAR LAB APPOINTMENT ON FRIDAY, 09/30/2018 AT 0900.  GO TO RADIOLOGY AT [facility].  PER JILL AT VASCULAR LAB [phone].

## 2018-09-30 NOTE — Progress Notes (Signed)
D:  Christina [name] was noted sitting in the day room all evening.  She interacted well with peers and was noted laughing and joking in the day room.  She reported to RN that she was having some swelling in her feet and legs.  Non pitting edema noted in her feet bilaterally.  She did state that they felt tight and hurt to walk.  Talked with her about keeping her feet elevated when she was in bed.  She was given extra pillow for bedtime.  She stated she was worried because she has had DVT previously.  Explained that DVT would more than likely be on one side only.  No redness or heat noted and vital signs were stable.  She agreed to use pillow tonight and RN will evaluate in AM.  Encouraged her to discuss this with MD tomorrow.  She denied SI/HI or A/V hallucinations.  She took her hs medications without difficulty and is currently resting with her eyes closed and appears to be asleep. A:  1:1 with RN for support and encouragement.  Medications as ordered.  Q 15 minute checks maintained for safety.  Encouraged participation in group and unit activities.   R:  Christina [name] remains safe on the unit.  We will continue to monitor the progress towards her goals.

## 2018-09-30 NOTE — Progress Notes (Signed)
[facility] MD Progress Note  09/30/2018 2:14 PM Christina [name]  MRN:  [id number] Subjective: Patient reports she is still depressed.  Endorses intermittent passive SI, currently denies any suicidal plan or intention.  Does not endorse medication side effects.  Does report bilateral pedal edema.   Objective:  I have discussed case with treatment team and have met with patient .  48 year old female, history of mood disorder, has been diagnosed with bipolar disorder in the past.  Presented with worsening depression and suicidal ideations in the context of significant losses, including death of her mother 6 months ago and unstable relationship with boyfriend. She also endorses chronic lower back pain, which refers to lower extremities, as contributing stressor.   At this time patient presents with partially improved mood.  She acknowledges some improvement compared to her admission status.  She states she remains depressed and ruminates about her stressors, mainly regarding unstable housing.  Affect seems more reactive and she is noted to be smiling/laughing/interacting appropriately with peers in dayroom.  No disruptive or agitated behaviors on unit.  Endorses passive thoughts of death/dying but currently denies any plan or intention of hurting herself, is able to contract for safety on unit Nonpitting edema noted on both feet.  Labs reviewed-TSH 1.77 (within normal limits), hemoglobin A1c 6.0, lipid panel unremarkable.   Principal Problem: MDD (major depressive disorder), recurrent episode, severe ([place]) Diagnosis: Principal Problem:   MDD (major depressive disorder), recurrent episode, severe ([place])  Total Time spent with patient: 20 minutes  Past Psychiatric History: See admission H&P  Past Medical History:  Past Medical History:  Diagnosis Date  . Anxiety   . Back pain   . 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  INTRAVASCULAR ULTRASOUND/IVUS Left 11/03/2016   Procedure: Intravascular Ultrasound/IVUS;  Surgeon: Waynetta Sandy, MD;  Location: [facility];  Service: Cardiovascular;  Laterality: Left;  . LOWER EXTREMITY VENOGRAPHY Left 11/04/2016   Procedure: Lower Extremity Venography;  Surgeon: Serafina Mitchell, MD;  Location: [facility];  Service: Cardiovascular;  Laterality: Left;  . PERIPHERAL VASCULAR INTERVENTION Left 11/04/2016   Procedure: Peripheral Vascular Intervention;  Surgeon: Serafina Mitchell, MD;  Location: [facility];  Service: Cardiovascular;  Laterality: Left;  common external  . PERIPHERAL VASCULAR THROMBECTOMY Left 11/03/2016   Procedure: Peripheral Vascular Thrombectomy;  Surgeon: Waynetta Sandy, MD;  Location: [facility];  Service: Cardiovascular;  Laterality: Left;   Family History: History reviewed. No pertinent family history. Family Psychiatric  History: See admission H&P Social History:  Social History   Substance and Sexual Activity  Alcohol Use No     Social History   Substance and Sexual Activity  Drug Use No    Social History   Socioeconomic History  . Marital status: Divorced    Spouse name: Not on file  . Number of children: Not on file  . Years of education: Not on file  . Highest education level: Not on file  Occupational History  . Not on file  Social Needs  . Financial resource strain: Very hard  . Food insecurity:    Worry: Sometimes true    Inability: Sometimes true  . Transportation needs:    Medical: No    Non-medical: No  Tobacco Use  . Smoking status: Current Every Day Smoker    Packs/day: 0.25    Types: Cigarettes  .  Smokeless tobacco: Never Used  Substance and Sexual Activity  . Alcohol use: No  . Drug use: No  . Sexual activity: Yes    Birth control/protection: Other-see comments    Comment: Tubal ligation  Lifestyle  . Physical activity:    Days per week: 2 days    Minutes per  session: 10 min  . Stress: Very much  Relationships  . Social connections:    Talks on phone: Three times a week    Gets together: More than three times a week    Attends religious service: Never    Active member of club or organization: No    Attends meetings of clubs or organizations: Never    Relationship status: Divorced  Other Topics Concern  . Not on file  Social History Narrative  . Not on file   Additional Social History:   Sleep: Fair/improving  Appetite:  Fair  Current Medications: Current Facility-Administered Medications  Medication Dose Route Frequency Provider Last Rate Last Dose  . acetaminophen (TYLENOL) tablet 650 mg  650 mg Oral Q6H PRN Laverle Hobby, PA-C   650 mg at 09/26/18 1823  . alum & mag hydroxide-simeth (MAALOX/MYLANTA) 200-200-20 MG/5ML suspension 30 mL  30 mL Oral Q4H PRN Laverle Hobby, PA-C      . DULoxetine (CYMBALTA) DR capsule 60 mg  60 mg Oral Daily Sharma Covert, MD   60 mg at 09/30/18 0757  . gabapentin (NEURONTIN) capsule 600 mg  600 mg Oral TID Cobos, Myer Peer, MD   600 mg at 09/30/18 1300  . hydrOXYzine (ATARAX/VISTARIL) tablet 50 mg  50 mg Oral Q6H PRN Sharma Covert, MD   50 mg at 09/30/18 0802  . lidocaine (LIDODERM) 5 % 1 patch  1 patch Transdermal Daily PRN Connye Burkitt, NP   1 patch at 09/28/18 1715  . magnesium hydroxide (MILK OF MAGNESIA) suspension 30 mL  30 mL Oral Daily PRN Patriciaann Clan E, PA-C      . nicotine (NICODERM CQ - dosed in mg/24 hours) patch 21 mg  21 mg Transdermal Daily Sharma Covert, MD   Stopped at 09/28/18 [id number]  . pantoprazole (PROTONIX) EC tablet 40 mg  40 mg Oral Daily Sharma Covert, MD   40 mg at 09/30/18 0758  . prazosin (MINIPRESS) capsule 1 mg  1 mg Oral QHS Sharma Covert, MD   1 mg at 09/29/18 2229  . QUEtiapine (SEROQUEL) tablet 400 mg  400 mg Oral QHS Cobos, Myer Peer, MD   400 mg at 09/29/18 2229  . rivaroxaban (XARELTO) tablet 20 mg  20 mg Oral Q supper Connye Burkitt,  NP   20 mg at 09/29/18 1641  . traMADol (ULTRAM) tablet 50 mg  50 mg Oral Q6H PRN Sharma Covert, MD   50 mg at 09/30/18 0802  . traZODone (DESYREL) tablet 50 mg  50 mg Oral QHS PRN Cobos, Myer Peer, MD   50 mg at 09/29/18 2229    Lab Results:  Results for orders placed or performed during the hospital encounter of 09/22/18 (from the past 48 hour(s))  Hemoglobin A1c     Status: Abnormal   Collection Time: 09/30/18  6:45 AM  Result Value Ref Range   Hgb A1c MFr Bld 6.0 (H) 4.8 - 5.6 %    Comment: (NOTE) Pre diabetes:          5.7%-6.4% Diabetes:              >  6.4% Glycemic control for   <7.0% adults with diabetes    Mean Plasma Glucose 125.5 mg/dL    Comment: Performed at [facility] [address], [place], [place] [address]  Lipid panel     Status: Abnormal   Collection Time: 09/30/18  6:45 AM  Result Value Ref Range   Cholesterol 195 0 - 200 mg/dL   Triglycerides 105 <150 mg/dL   HDL 57 >40 mg/dL   Total CHOL/HDL Ratio 3.4 RATIO   VLDL 21 0 - 40 mg/dL   LDL Cholesterol 117 (H) 0 - 99 mg/dL    Comment:        Total Cholesterol/HDL:CHD Risk Coronary Heart Disease Risk Table                     Men   Women  1/2 Average Risk   3.4   3.3  Average Risk       5.0   4.4  2 X Average Risk   9.6   7.1  3 X Average Risk  23.4   11.0        Use the calculated Patient Ratio above and the CHD Risk Table to determine the patient's CHD Risk.        ATP III CLASSIFICATION (LDL):  <100     mg/dL   Optimal  100-129  mg/dL   Near or Above                    Optimal  130-159  mg/dL   Borderline  160-189  mg/dL   High  >190     mg/dL   Very High Performed at [facility] [address], [place], [place] [address]   TSH     Status: None   Collection Time: 09/30/18  6:45 AM  Result Value Ref Range   TSH 1.773 0.350 - 4.500 uIU/mL    Comment: Performed by a 3rd Generation assay with a functional sensitivity of <=0.01 uIU/mL. Performed at [facility], [place] [address] [place], [place] [address]     Blood Alcohol level:  Lab Results  Component Value Date   ETH <10 94/17/4081    Metabolic Disorder Labs: Lab Results  Component Value Date   HGBA1C 6.0 (H) 09/30/2018   MPG 125.5 09/30/2018   No results found for: PROLACTIN Lab Results  Component Value Date   CHOL 195 09/30/2018   TRIG 105 09/30/2018   HDL 57 09/30/2018   CHOLHDL 3.4 09/30/2018   VLDL 21 09/30/2018   LDLCALC 117 (H) 09/30/2018    Physical Findings: AIMS: Facial and Oral Movements Muscles of Facial Expression: None, normal Lips and Perioral Area: None, normal Jaw: None, normal Tongue: None, normal,Extremity Movements Upper (arms, wrists, hands, fingers): None, normal Lower (legs, knees, ankles, toes): None, normal, Trunk Movements Neck, shoulders, hips: None, normal, Overall Severity Severity of abnormal movements (highest score from questions above): None, normal Incapacitation due to abnormal movements: None, normal Patient's awareness of abnormal movements (rate only patient's report): No Awareness, Dental Status Current problems with teeth and/or dentures?: No Does patient usually wear dentures?: No  CIWA:  CIWA-Ar Total: 1 COWS:  COWS Total Score: 3  Musculoskeletal: Strength & Muscle Tone: within normal limits Gait & Station: normal Patient leans: N/A  Psychiatric Specialty Exam: Physical Exam  Nursing note and vitals reviewed. Constitutional: She is oriented to person, place, and time. She appears well-developed and well-nourished.  Cardiovascular:  Normal rate.  Respiratory: Effort normal.  Neurological: She is alert and oriented to person, place, and time.    Review of Systems  Constitutional: Negative.   Psychiatric/Behavioral: Positive for depression and substance abuse (UDS +THC). Negative for hallucinations, memory loss and suicidal ideas. The patient is not nervous/anxious and does not have insomnia.   No chest  pain, no shortness of breath at room air, chronic lower back pain.  Of note, patient is on Xarelto for history of DVT.  Currently denies any bleeding or bruising  Blood pressure 109/71, pulse 90, temperature 99.1 F (37.3 C), temperature source Oral, resp. rate 18, height 5' 6"  (1.676 m), weight 70.8 kg, last menstrual period 09/22/2018.Body mass index is 25.18 kg/m.  General Appearance: Improving  grooming  Eye Contact:  Good  Speech:  Normal Rate  Volume:  Normal  Mood:  Acknowledges some improvement compared to admission, reports still feels depressed  Affect:  Constricted but noted to be reactive, fuller in range  Thought Process:  Linear and Descriptions of Associations: Intact  Orientation:  Full (Time, Place, and Person)  Thought Content:  No hallucinations, no delusions, not internally preoccupied  Suicidal Thoughts:  Yes.  without intent/plan-reports some thoughts of death/dying when thinking of her stressors but at this time denies  suicidal plan or intention  Homicidal Thoughts:  No  Memory:  Recent and remote grossly intact  Judgement:  Other:  Fair/improving  Insight:  Fair  Psychomotor Activity:  Normal-visible in dayroom  Concentration:  Concentration: Good  Recall:  Good  Fund of Knowledge:  Good  Language:  Good  Akathisia:  No  Handed:  Right  AIMS (if indicated):     Assets:  Communication Skills Desire for Improvement Resilience Social Support  ADL's:  Intact  Cognition:  WNL  Sleep:  Number of Hours: 5.25   Assessment: 48 year old female, history of mood disorder, has been diagnosed with bipolar disorder in the past.  Presented with worsening depression and suicidal ideations in the context of significant losses, including death of her mother 6 months ago and unstable relationship with boyfriend. She also endorses chronic lower back pain, which refers to lower extremities, as contributing stressor.   Patient reports persistent depression although does  acknowledge some improvement compared to admission.  Continues to present with a constricted affect although is noted by staff to have a more reactive and full range of affect particularly when interacting with peers.  She is focused on stressors, which include unstable housing/homelessness.  Currently endorses some passive thoughts of death/dying but denies any suicidal plan or intention and contracts for safety on unit.  She is tolerating medications well, has developed mild bilateral pedal edema, which can be side effect of Seroquel. Patient has chronic back pain, some improvement on current medication regimen, currently does not appear to be in any acute distress or discomfort.  Of note, however, patient has a history of DVT (currently on Xarelto), so will order a lower extremity Duplex/Doppler.  Treatment Plan Summary: Daily contact with patient to assess and evaluate symptoms and progress in treatment and Medication management  Treatment plan reviewed as below today 2/6 Encourage group and milieu participation to work on coping skills and symptom reduction Continue Lidocaine 5% patch daily PRN back pain, remove after 12 hours Continue Tylenol 650 mg  Q6HR PRN pain Continue Cymbalta 60 mg  daily for depression Continue Gabapentin  600 mg PO TID for anxiety/pain Continue Vistaril 50 mg PO Q6HR PRN anxiety Continue Prazosin  1 mg PO QHS for nightmares Continue  Seroquel  400 mg PO QHS for mood Continue  Trazodone  50  mg PO QHS for insomnia Continue Xarelto 20 mg PO daily for DVT Treatment team working on disposition planning options [place] LE doppler due to history of DVT/new onset peripheral edema Jenne Campus, MD 09/30/2018, 2:14 PM   Patient ID: Winfield Cunas, female   DOB: 1970/11/21, 48 y.o.   MRN: [id number]

## 2018-10-01 ENCOUNTER — Ambulatory Visit (HOSPITAL_COMMUNITY): Payer: Self-pay | Attending: Psychiatry

## 2018-10-01 ENCOUNTER — Encounter (HOSPITAL_COMMUNITY): Payer: Self-pay | Admitting: Behavioral Health

## 2018-10-01 DIAGNOSIS — R600 Localized edema: Secondary | ICD-10-CM | POA: Insufficient documentation

## 2018-10-01 DIAGNOSIS — Z7901 Long term (current) use of anticoagulants: Secondary | ICD-10-CM | POA: Insufficient documentation

## 2018-10-01 DIAGNOSIS — R609 Edema, unspecified: Secondary | ICD-10-CM

## 2018-10-01 DIAGNOSIS — Z86718 Personal history of other venous thrombosis and embolism: Secondary | ICD-10-CM | POA: Insufficient documentation

## 2018-10-01 MED ORDER — QUETIAPINE FUMARATE 200 MG PO TABS
200.0000 mg | ORAL_TABLET | Freq: Every day | ORAL | Status: DC
  Administered 2018-10-01 – 2018-10-03 (×3): 200 mg via ORAL
  Filled 2018-10-01 (×4): qty 1

## 2018-10-01 MED ORDER — OSELTAMIVIR PHOSPHATE 75 MG PO CAPS
75.0000 mg | ORAL_CAPSULE | Freq: Every day | ORAL | Status: DC
  Administered 2018-10-01 – 2018-10-06 (×6): 75 mg via ORAL
  Filled 2018-10-01: qty 5
  Filled 2018-10-01 (×8): qty 1

## 2018-10-01 NOTE — Progress Notes (Signed)
Pt attended wrap-up group this evening. Pt denies SI/HI/AVH/Pain at this time. Pt appears flat in affect and mood. Pt was minimal/cautious with Clinical research associate. Pt states she might get to go home on monday.No new c/o's. Support offered. Will continue with POC.

## 2018-10-01 NOTE — Progress Notes (Signed)
Patient off unit getting ultrasound bilateral legs.  She is complaining to swelling lower bilateral legs.  She has a hx of DVT.

## 2018-10-01 NOTE — Progress Notes (Signed)
Recreation Therapy Notes  Date:  2.7..20 Time: 0930 Location: [address] Hall Dayroom  Group Topic: Stress Management  Goal Area(s) Addresses:  Patient will identify positive stress management techniques. Patient will identify benefits of using stress management post d/c.  Intervention:  Stress Management  Activity :   Meditation.  LRT introduced the stress management technique of meditation.  LRT played [name] meditation that focused on the idea of "pure possibility" to bring into the day.  Patients were to listen to the meditation and follow along as it played to engage.  Education:  Stress Management, Discharge Planning.   Education Outcome: Acknowledges Education  Clinical Observations/Feedback:  Pt did not attend group.      Christina [name], LRT/CTRS         Christina [name] 10/01/2018 11:30 AM 

## 2018-10-01 NOTE — Progress Notes (Signed)
LE venous duplex       has been completed. Preliminary results can be found under CV proc through chart review. Saifan Rayford, BS, RDMS, RVT   

## 2018-10-01 NOTE — Progress Notes (Signed)
[facility]  10/01/2018 9:21 AM Sonia Sideimuira Tomasini  MRN:  [id number] Subjective: Patient continues to endorse ongoing depression and describes lingering  passive suicidal thoughts, although denies any plan or intention. Describes persistent subjective sense of emotional lability and states," I feel like I am on an emotional rollercoaster." Does not endorse medication side effects.   Reports bipedal peripheral edema x 2 days, bilateral but a little more pronounced on R foot .  Objective: Patient seen,  chart reviewed,and case discussed with treatment team.  In brief; this is a 48 year old female, history of mood disorder, has been diagnosed with bipolar disorder in the past.  Presented with worsening depression and suicidal ideations in the context of significant losses, including death of her mother 6 months ago, unstable relationship with boyfriend, unstable housing situation. She has a history of chronic lower back pain, which refers to lower extremities, as contributing stressor.   During this evaluation, patient endorses persistent  depression with passive suicidal thoughts and continues to  ruminate about her stressors ( homelessness being major current stressor), but is noted to be future oriented, focusing more on disposition planning options and expressing intent to apply for disability. She contracts for safety on unit . Denies medication side effects. Seroquel may be associated with bipedal edema, and it appears to have been temporally related to Seroquel titration. Due to history of DVT a LE doppler was ordered- reported as negative for DVT. Of note, affect is reactive and in milieu is noted to smile, engage with peers appropriately .  We discussed disposition options , including [facility] and other shelters. She states she prefers to stay in [place] area as her outpatient treatment is local.  patient reported she preferred to stay on the [place] area.  She presents  without  disruptive or agitated behaviors on unit.     Principal Problem: MDD (major depressive disorder), recurrent episode, severe (HCC) Diagnosis: Principal Problem:   MDD (major depressive disorder), recurrent episode, severe (HCC)  Total Time spent with patient: 20 minutes  Past Psychiatric History: See admission H&P  Past Medical History:  Past Medical History:  Diagnosis Date  . Anxiety   . Back pain   . 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 INTRAVASCULAR ULTRASOUND/IVUS Left 11/03/2016   Procedure: Intravascular Ultrasound/IVUS;  Surgeon: Maeola HarmanBrandon Christopher Cain, MD;  [facility];  Service: Cardiovascular;  Laterality: Left;  . LOWER EXTREMITY VENOGRAPHY Left 11/04/2016   Procedure: Lower Extremity Venography;  Surgeon: Nada LibmanVance W Brabham, MD;  [facility];  Service: Cardiovascular;  Laterality: Left;  . PERIPHERAL VASCULAR INTERVENTION Left 11/04/2016   Procedure: Peripheral Vascular Intervention;  Surgeon: Nada LibmanVance W Brabham, MD;  [facility];  Service: Cardiovascular;  Laterality: Left;  common external  . PERIPHERAL VASCULAR THROMBECTOMY Left 11/03/2016   Procedure: Peripheral Vascular Thrombectomy;  Surgeon: Maeola HarmanBrandon Christopher Cain, MD;  [facility];  Service: Cardiovascular;  Laterality: Left;   Family History: History reviewed. No pertinent family history. Family Psychiatric  History: See admission H&P Social History:  Social History   Substance and Sexual Activity  Alcohol Use No     Social History   Substance and Sexual Activity  Drug Use No    Social History   Socioeconomic History  . Marital status: Divorced    Spouse name: Not on file  . Number  of children: Not on file  . Years of education: Not on file  . Highest education level: Not on file  Occupational History  . Not on file  Social Needs  . Financial  resource strain: Very hard  . Food insecurity:    Worry: Sometimes true    Inability: Sometimes true  . Transportation needs:    Medical: No    Non-medical: No  Tobacco Use  . Smoking status: Current Every Day Smoker    Packs/day: 0.25    Types: Cigarettes  . Smokeless tobacco: Never Used  Substance and Sexual Activity  . Alcohol use: No  . Drug use: No  . Sexual activity: Yes    Birth control/protection: Other-see comments    Comment: Tubal ligation  Lifestyle  . Physical activity:    Days per week: 2 days    Minutes per session: 10 min  . Stress: Very much  Relationships  . Social connections:    Talks on phone: Three times a week    Gets together: More than three times a week    Attends religious service: Never    Active member of club or organization: No    Attends meetings of clubs or organizations: Never    Relationship status: Divorced  Other Topics Concern  . Not on file  Social History Narrative  . Not on file   Additional Social History:   Sleep: Fair/improving  Appetite:  Fair  Current Medications: Current Facility-Administered Medications  Medication Dose Route Frequency Provider Last Rate Last Dose  . acetaminophen (TYLENOL) tablet 650 mg  650 mg Oral Q6H PRN Kerry Hough, PA-C   650 mg at 09/26/18 1823  . alum & mag hydroxide-simeth (MAALOX/MYLANTA) 200-200-20 MG/5ML suspension 30 mL  30 mL Oral Q4H PRN Kerry Hough, PA-C      . DULoxetine (CYMBALTA) DR capsule 60 mg  60 mg Oral Daily Antonieta Pert, MD   60 mg at 10/01/18 1610  . gabapentin (NEURONTIN) capsule 600 mg  600 mg Oral TID , Rockey Situ, MD   600 mg at 10/01/18 9604  . hydrOXYzine (ATARAX/VISTARIL) tablet 50 mg  50 mg Oral Q6H PRN Antonieta Pert, MD   50 mg at 09/30/18 0802  . lidocaine (LIDODERM) 5 % 1 patch  1 patch Transdermal Daily PRN Aldean Baker, NP   1 patch at 09/28/18 1715  . magnesium hydroxide (MILK OF MAGNESIA) suspension 30 mL  30 mL Oral Daily PRN Donell Sievert E, PA-C      . nicotine (NICODERM CQ - dosed in mg/24 hours) patch 21 mg  21 mg Transdermal Daily Antonieta Pert, MD   Stopped at 09/28/18 [phone]  . pantoprazole (PROTONIX) EC tablet 40 mg  40 mg Oral Daily Antonieta Pert, MD   40 mg at 10/01/18 8119  . prazosin (MINIPRESS) capsule 1 mg  1 mg Oral QHS Antonieta Pert, MD   1 mg at 09/30/18 2222  . QUEtiapine (SEROQUEL) tablet 200 mg  200 mg Oral QHS ,  A, MD      . rivaroxaban (XARELTO) tablet 20 mg  20 mg Oral Q supper Aldean Baker, NP   20 mg at 09/30/18 1710  . traMADol (ULTRAM) tablet 50 mg  50 mg Oral Q6H PRN Antonieta Pert, MD   50 mg at 09/30/18 0802  . traZODone (DESYREL) tablet 50 mg  50 mg Oral QHS PRN , Rockey Situ, MD   50 mg at  09/30/18 2223    Lab Results:  Results for orders placed or performed during the hospital encounter of 09/22/18 (from the past 48 hour(s))  Hemoglobin A1c     Status: Abnormal   Collection Time: 09/30/18  6:45 AM  Result Value Ref Range   Hgb A1c MFr Bld 6.0 (H) 4.8 - 5.6 %    Comment: (NOTE) Pre diabetes:          5.7%-6.4% Diabetes:              >6.4% Glycemic control for   <7.0% adults with diabetes    Mean Plasma Glucose 125.5 mg/dL    Comment: Performed at [facility], [address], [place], [place] [address]  Lipid panel     Status: Abnormal   Collection Time: 09/30/18  6:45 AM  Result Value Ref Range   Cholesterol 195 0 - 200 mg/dL   Triglycerides 604 <540 mg/dL   HDL 57 >98 mg/dL   Total CHOL/HDL Ratio 3.4 RATIO   VLDL 21 0 - 40 mg/dL   LDL Cholesterol 119 (H) 0 - 99 mg/dL    Comment:        Total Cholesterol/HDL:CHD Risk Coronary Heart Disease Risk Table                     Men   Women  1/2 Average Risk   3.4   3.3  Average Risk       5.0   4.4  2 X Average Risk   9.6   7.1  3 X Average Risk  23.4   11.0        Use the calculated Patient Ratio above and the CHD Risk Table to determine the patient's CHD Risk.        ATP III  CLASSIFICATION (LDL):  <100     mg/dL   Optimal  147-829  mg/dL   Near or Above                    Optimal  130-159  mg/dL   Borderline  562-130  mg/dL   High  >865     mg/dL   Very High Performed at [facility], [address], [place], [place] [address]   TSH     Status: None   Collection Time: 09/30/18  6:45 AM  Result Value Ref Range   TSH 1.773 0.350 - 4.500 uIU/mL    Comment: Performed by a 3rd Generation assay with a functional sensitivity of <=0.01 uIU/mL. Performed at [facility], [address], [place], [place] [address]     Blood Alcohol level:  Lab Results  Component Value Date   ETH <10 09/22/2018    Metabolic Disorder Labs: Lab Results  Component Value Date   HGBA1C 6.0 (H) 09/30/2018   MPG 125.5 09/30/2018   No results found for: PROLACTIN Lab Results  Component Value Date   CHOL 195 09/30/2018   TRIG 105 09/30/2018   HDL 57 09/30/2018   CHOLHDL 3.4 09/30/2018   VLDL 21 09/30/2018   LDLCALC 117 (H) 09/30/2018    Physical Findings: AIMS: Facial and Oral Movements Muscles of Facial Expression: None, normal Lips and Perioral Area: None, normal Jaw: None, normal Tongue: None, normal,Extremity Movements Upper (arms, wrists, hands, fingers): None, normal Lower (legs, knees, ankles, toes): None, normal, Trunk Movements Neck, shoulders, hips: None, normal, Overall Severity Severity of abnormal movements (highest score from questions above): None,  normal Incapacitation due to abnormal movements: None, normal Patient's awareness of abnormal movements (rate only patient's report): No Awareness, Dental Status Current problems with teeth and/or dentures?: No Does patient usually wear dentures?: No  CIWA:  CIWA-Ar Total: 1 COWS:  COWS Total Score: 2  Musculoskeletal: Strength & Muscle Tone: within normal limits Gait & Station: normal Patient leans: N/A  Psychiatric Specialty Exam: Physical Exam  Nursing note  and vitals reviewed. Constitutional: She is oriented to person, place, and time. She appears well-developed and well-nourished.  Cardiovascular: Normal rate.  Respiratory: Effort normal.  Neurological: She is alert and oriented to person, place, and time.    Review of Systems  Constitutional: Negative.   Psychiatric/Behavioral: Positive for depression and substance abuse (UDS +THC). Negative for hallucinations, memory loss and suicidal ideas. The patient is not nervous/anxious and does not have insomnia.   No chest pain, no shortness of breath at room air, chronic lower back pain.  Of note, patient is on Xarelto for history of DVT.  Currently denies any bleeding or bruising  Blood pressure 113/81, pulse 89, temperature 98.1 F (36.7 C), temperature source Oral, resp. rate 20, height 5\' 6"  (1.676 m), weight 70.8 kg, last menstrual period 09/22/2018.Body mass index is 25.18 kg/m.  General Appearance: Well Groomed  Eye Contact:  Good  Speech:  Normal Rate  Volume:  Normal  Mood:  partial improvement , remains depressed   Affect:  constricted, reactive  Thought Process:  Linear and Descriptions of Associations: Intact  Orientation:  Full (Time, Place, and Person)  Thought Content:  No hallucinations, no delusions, not internally preoccupied.   Suicidal Thoughts:  Yes.  without intent/plan- has endorsed intermittent passive thoughts of death, dying , but denies suicidal plan or intention and presents future oriented at present   Homicidal Thoughts:  No  Memory:  Recent and remote grossly intact  Judgement:  Other:  Fair/improving  Insight:  Fair  Psychomotor Activity:  Normal- no psychomotor agitation or restlessness  Concentration:  Concentration: Good  Recall:  Good  Fund of Knowledge:  Good  Language:  Good  Akathisia:  No  Handed:  Right  AIMS (if indicated):     Assets:  Communication Skills Desire for Improvement Resilience Social Support  ADL's:  Intact  Cognition:  WNL   Sleep:  Number of Hours: 6   Assessment: 48 year old female, history of mood disorder, has been diagnosed with bipolar disorder in the past.  Presented with worsening depression and suicidal ideations in the context of significant losses, including death of her mother 6 months ago and unstable relationship with boyfriend. She also endorses chronic lower back pain, which refers to lower extremities, as contributing stressor.   Patient reports persistent/lingering depression which she attributes to significant stressors, mainly homelessness and financial constraints.  She describes intermittent passive thoughts of death/dying but at this time presents more future oriented, more focused on disposition planning and denies any suicidal plan or intent.  She is tolerating medications well-it is noted that bipedal edema may have coincided with Seroquel dose titration.  Lower extremity Doppler was negative for DVT bilaterally.   Treatment Plan Summary: Daily contact with patient to assess and evaluate symptoms and progress in treatment and Medication management  Treatment plan reviewed as below today 10/01/2018. Encourage group and milieu participation to work on coping skills and symptom reduction Continue Lidocaine 5% patch daily PRN back pain, remove after 12 hours Continue Tylenol 650 mg  Q6HR PRN pain Continue Cymbalta 60 mg  daily for depression Continue Gabapentin  600 mg PO TID for anxiety/pain Continue Vistaril 50 mg PO Q6HR PRN anxiety Continue Prazosin 1 mg PO QHS for nightmares Decrease Seroquel  to 200 mg PO QHS for mood as it may be a contributor to peripheral edema.  Continue  Trazodone  50  mg PO QHS for insomnia Continue Xarelto 20 mg PO daily for DVT Treatment team working on disposition planning options  FCobos MD  Patient ID: Magin Michon, female   DOB: 1971-08-09, 48 y.o.   MRN: [id number]

## 2018-10-01 NOTE — Progress Notes (Signed)
D:  Christina [name] was up and visible on the unit.  She was noted sitting in the day room watching movies with her peers.  She reported her day was pretty good.  She denied SI/HI or A/V hallucinations.  She continues to report difficulty with swelling in her lower extremities and is looking forward to the test tomorrow.  She took her hs medications without difficulty and requested trazodone prn for sleep.  She is currently resting with her eyes closed and appears to be asleep. A:  1:1 with RN for support and encouragement.  Medications as ordered.  Q 15 minute checks maintained for safety.  Encouraged participation in group and unit activities.   R:  Christina [name] remains safe on the unit.  We will continue to monitor the progress towards her goals.

## 2018-10-01 NOTE — Progress Notes (Signed)
Progress note: Patient has been identified as potentially exposed to other patient with influenza. We will provide prophylaxis with tamiflu. 

## 2018-10-01 NOTE — Progress Notes (Signed)
D: Patient denies any thoughts of self harm today.  She rates her depression, hopelessness and anxiety as a 10.  She reports poor sleep and concentration.  Her energy is low.  Her goal today is to "get through the day slowing my brain down getting off this emotional roller coaster."  A: Continue to monitor medication management and MD orders.  Safety checks completed every 15 minutes per protocol.  Offer support and encouragement as needed.  R: Patient is receptive to staff; his/her behavior is appropriate.

## 2018-10-02 NOTE — Progress Notes (Signed)
D: Patient reports her pain from her fall is improving. She feels that the cold is not helping much, but the tramadol is improving it. Patient respirations are even and unlabored. She is in NAD. She is hunched over. A: Encouraged ambulation. R: Patient believes ambulation is helpful to preventing stiffness.

## 2018-10-02 NOTE — BHH Group Notes (Signed)
Adult Psychoeducational Group Note  Date:  10/02/2018 Time:  9:26 PM  Group Topic/Focus:  Wrap-Up Group:   The focus of this group is to help patients review their daily goal of treatment and discuss progress on daily workbooks.  Participation Level:  Active  Participation Quality:  Appropriate and Attentive  Affect:  Appropriate  Cognitive:  Alert and Appropriate  Insight: Appropriate and Good  Engagement in Group:  Engaged  Modes of Intervention:  Discussion and Education  Additional Comments:  Pt attended and participated in wrap up group this evening. Pt rated their day a 4/10, due to them having fun in the rec area. Pt completed their goal, which was to make it through the day and to restore their faith in God.     Chrisandra Netters 10/02/2018, 9:26 PM

## 2018-10-02 NOTE — [facility] Group Notes (Signed)
[facility]  Date/Time:    10/02/2018 10:00-11:00AM  Type of Therapy and Topic:  Group Therapy:  Healthy vs Unhealthy Coping Skills  Participation Level:  Active   Description of Group:  The focus of this group was to determine what unhealthy coping techniques typically are used by group members and what healthy coping techniques would be helpful in coping with various problems. Patients were guided in becoming aware of the differences between healthy and unhealthy coping techniques.  Patients were asked to identify 1-2 healthy coping skills they would like to learn to use more effectively, and many mentioned meditation, breathing, and relaxation.  These were explained, samples demonstrated, and resources shared for how to learn more at discharge.     Therapeutic Goals 1. Patients learned that coping is what human beings do all day long to deal with various situations in their lives 2. Patients defined and discussed healthy vs unhealthy coping techniques 3. Patients identified their preferred coping techniques and identified whether these were healthy or unhealthy 4. Patients determined 1-2 healthy coping skills they would like to become more familiar with and use more often, and practiced a few meditations 5. Patients provided support and ideas to each other  Summary of Patient Progress: During group, patient expressed that she tends to stuff things inside herself until she explodes and actually recently became homicidal when she was so angry.  She talked about her faith-based activities as positive coping techniques, but also was focused on wanting to steer others to use this as well "without shoving it down their throats."  She continued to engage in side conversations and had to be redirected multiple times.   Therapeutic Modalities Cognitive Behavioral Therapy Motivational Interviewing   Ambrose MantleMareida Grossman-Orr, LCSW 10/02/2018, 11:27 AM

## 2018-10-02 NOTE — Progress Notes (Signed)
Pt was playing basketball in the gym. Fell while playing basketball, got back up and said she was ok.-JAB

## 2018-10-02 NOTE — Progress Notes (Signed)
D: Patient came to nurse's station after recreation time complaining of having had a fall. She is standing upright, and had good range of motion in BLE and BUE. It was reported by the MHT who saw her that she fell while playing basketball, but got right back up and kept playing. She was asked if she was hurt at that time, but she denied injury. I had her sit down to get a set of vitals and assess her further. Neuro intact and PERRLA intact. Patient denies hitting her head. She does c/o lightheadedness caused by the pain. Patient c/o pain 11/10 in lower back where her sciatica is. She then began to stand up and immediately went from full range of motion to a hunched over position when I offered pain medication.  A: Administered Tramadol and gave ice packs for pain. Will reassess in 2 hrs for any changes. R: Patient directed to lay down and rest

## 2018-10-02 NOTE — Plan of Care (Signed)
D: Patient presents depressed, tearful. She report feeling numb and anhedonic, she "don't feel good, don't feel bad." She states that she doesn't want to commit suicide, but rather wants "to take the pain away." She presents mildly irritable. She does endorse seeing illusions out of the corner of her eye, that aren't there when she turns to look. She slept fair last night. Her appetite is fair, energy low and concentration poor. She rates her depression, hopelessness and anxiety 8/10. She denies withdrawal symptoms. She does c/o sciatica and a headache. Patient denies SI/HI.  A: Patient checked q15 min, and checks reviewed. Reviewed medication changes with patient and educated on side effects. Educated patient on importance of attending group therapy sessions and educated on several coping skills. Encouarged participation in milieu through recreation therapy and attending meals with peers. Support and encouragement provided. Fluids offered. R: Patient receptive to education on medications, and is medication compliant. Patient contracts for safety on the unit. Her goal today is "getting through the day" and "think better."
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
## 2018-10-03 DIAGNOSIS — R609 Edema, unspecified: Secondary | ICD-10-CM | POA: Diagnosis not present

## 2018-10-03 MED ORDER — GABAPENTIN 300 MG PO CAPS
300.0000 mg | ORAL_CAPSULE | Freq: Three times a day (TID) | ORAL | Status: DC
  Administered 2018-10-03 – 2018-10-06 (×10): 300 mg via ORAL
  Filled 2018-10-03 (×4): qty 1
  Filled 2018-10-03: qty 21
  Filled 2018-10-03 (×4): qty 1
  Filled 2018-10-03: qty 21
  Filled 2018-10-03 (×2): qty 1
  Filled 2018-10-03: qty 21
  Filled 2018-10-03 (×4): qty 1

## 2018-10-03 MED ORDER — QUETIAPINE FUMARATE 25 MG PO TABS
25.0000 mg | ORAL_TABLET | Freq: Two times a day (BID) | ORAL | Status: DC
  Administered 2018-10-03 – 2018-10-04 (×3): 25 mg via ORAL
  Filled 2018-10-03 (×5): qty 1
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
## 2018-10-03 NOTE — Progress Notes (Signed)
D. Pt observed in the dayroom throughout the shift, interacting well with peers and attending group. Per pt's self inventory, pt rated her depression, hopelessness and anxiety all 7's this am. Pt continues to complain of "sciatica pain and swollen feet". 2+ pitting edema noted bilateral  lower extremity.Pt currently denies SI/HI and AV hallucinations A. Labs and vitals monitored. Contacted [employer] re: compression hose. [employer] closed at 1530 - compression hose will have to be delivered tomorrow.  Pt compliant with medications. Pt supported emotionally and encouraged to express concerns and ask questions.   R. Pt remains safe with 15 minute checks. Will continue POC.

## 2018-10-03 NOTE — [facility] Group Notes (Signed)
[facility] LCSW Group Therapy Note  10/03/2018   10:00-11:00AM  Type of Therapy and Topic:  Group Therapy:  Unhealthy versus Healthy Supports, Which Am I?  Participation Level:  Active   Description of Group:  Patients in this group were introduced to the concept that additional supports including self-support are an essential part of recovery.  Initially a discussion was held about the differences between healthy versus unhealthy supports.  Patients were asked to share what unhealthy supports in their lives need to be addressed, as well as what additional healthy supports could be added for greater help in reaching their goals.   A song entitled "My Own Hero" was played and a group discussion ensued in which patients stated they could relate to the song and it inspired them to realize they have be willing to help themselves in order to succeed, because other people cannot achieve sobriety or stability for them.  We discussed adding a variety of healthy supports to address the various needs in patient lives, including becoming more self-supportive.  Therapeutic Goals: 1)  Highlight the differences between healthy and unhealthy supports 2)  Suggest the importance of being a part of one's own support system 2)  Discuss reasons people in one's life may eventually be unable to be continually supportive  3)  Identify the patient's current support system and   4)  elicit commitments to add healthy supports and to become more conscious of being self-supportive   Summary of Patient Progress:  The patient arrived late to group but was very involved in the discussion.  Therapeutic Modalities:   Motivational Interviewing Activity  Lynnell Chad

## 2018-10-03 NOTE — Progress Notes (Signed)
Adult Psychoeducational Group Note  Date:  10/03/2018 Time:  9:08 PM  Group Topic/Focus:  Wrap-Up Group:   The focus of this group is to help patients review their daily goal of treatment and discuss progress on daily workbooks.  Participation Level:  Active  Participation Quality:  Appropriate  Affect:  Appropriate  Cognitive:  Appropriate  Insight: Appropriate  Engagement in Group:  Engaged  Modes of Intervention:  Discussion  Additional Comments:  Patient attended group and participated. Christina [name] 10/03/2018, 9:08 PM

## 2018-10-03 NOTE — Progress Notes (Addendum)
[facility] MD Progress Note  10/03/2018 2:09 PM Christina [name]  MRN:  [id number]   Subjective: Patient reports today that she is in a lot of pain.  She reports that she was playing basketball yesterday and fell down and it aggravated her sciatica.  Patient also reports complaint of bilateral lower extremity edema and feels that it may be due to the medications.  She denies any history of edema states that it only started after she started medications approximately 4 days ago.  Currently the patient still reports having some depression, but denies any suicidal homicidal ideations and denies any hallucinations.  Patient denies any other medication side effects.  Objective: Patient's chart and findings reviewed and discussed with treatment team.  Patient presents in the day room and is pleasant, calm, and cooperative.  Patient has been seen interacting with peers and staff appropriately.  Patient has been attending groups and has been active during groups.  Discussed patient's symptoms with Dr. Jama Flavors and have decided to decrease the Neurontin with a plan to discontinue tomorrow due to the side effect being peripheral edema.  We will start Seroquel 25 mg p.o. twice daily to assist with patient's anxiety and help improve her mood.  Principal Problem: MDD (major depressive disorder), recurrent episode, severe (HCC) Diagnosis: Principal Problem:   MDD (major depressive disorder), recurrent episode, severe (HCC) Active Problems:   Chronic bilateral low back pain with bilateral sciatica   Edema, peripheral  Total Time spent with patient: 30 minutes  Past Psychiatric History: See H&P  Past Medical History:  Past Medical History:  Diagnosis Date  . Anxiety   . Back pain   . 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 INTRAVASCULAR ULTRASOUND/IVUS Left 11/03/2016   Procedure: Intravascular Ultrasound/IVUS;  Surgeon: Maeola Harman, MD;  Location: [facility] INVASIVE CV LAB;  Service: Cardiovascular;  Laterality: Left;  . LOWER EXTREMITY VENOGRAPHY Left 11/04/2016   Procedure: Lower Extremity Venography;  Surgeon: Nada Libman, MD;  Location: [facility] INVASIVE CV LAB;  Service: Cardiovascular;  Laterality: Left;  . PERIPHERAL VASCULAR INTERVENTION Left 11/04/2016   Procedure: Peripheral Vascular Intervention;  Surgeon: Nada Libman, MD;  Location: [facility] INVASIVE CV LAB;  Service: Cardiovascular;  Laterality: Left;  common external  . PERIPHERAL VASCULAR THROMBECTOMY Left 11/03/2016   Procedure: Peripheral Vascular Thrombectomy;  Surgeon: Maeola Harman, MD;  Location: [facility] INVASIVE CV LAB;  Service: Cardiovascular;  Laterality: Left;   Family History: History reviewed. No pertinent family history. Family Psychiatric  History: See H&P Social History:  Social History   Substance and Sexual Activity  Alcohol Use No     Social History   Substance and Sexual Activity  Drug Use No    Social History   Socioeconomic History  . Marital status: Divorced    Spouse name: Not on file  . Number of children: Not on file  . Years of education: Not on file  . Highest education level: Not on file  Occupational History  . Not on file  Social Needs  . Financial resource strain: Very hard  . Food insecurity:    Worry: Sometimes true    Inability: Sometimes true  . Transportation needs:    Medical: No    Non-medical: No  Tobacco Use  . Smoking status: Current Every Day Smoker    Packs/day: 0.25    Types: Cigarettes  . Smokeless tobacco: Never Used  Substance and Sexual Activity  . Alcohol use: No  . Drug use: No  . Sexual activity: Yes    Birth control/protection: Other-see comments    Comment: Tubal ligation  Lifestyle  . Physical activity:    Days per week: 2 days    Minutes per session: 10 min  . Stress: Very much  Relationships  . Social connections:    Talks on phone: Three times a week    Gets together:  More than three times a week    Attends religious service: Never    Active member of club or organization: No    Attends meetings of clubs or organizations: Never    Relationship status: Divorced  Other Topics Concern  . Not on file  Social History Narrative  . Not on file   Additional Social History:                         Sleep: Good  Appetite:  Good  Current Medications: Current Facility-Administered Medications  Medication Dose Route Frequency Provider Last Rate Last Dose  . acetaminophen (TYLENOL) tablet 650 mg  650 mg Oral Q6H PRN Kerry HoughSimon, Spencer E, PA-C   650 mg at 10/03/18 1147  . alum & mag hydroxide-simeth (MAALOX/MYLANTA) 200-200-20 MG/5ML suspension 30 mL  30 mL Oral Q4H PRN Kerry HoughSimon, Spencer E, PA-C      . DULoxetine (CYMBALTA) DR capsule 60 mg  60 mg Oral Daily Antonieta Pertlary, Greg Lawson, MD   60 mg at 10/03/18 0847  . gabapentin (NEURONTIN) capsule 300 mg  300 mg Oral TID Money, Feliz Beamravis B, FNP   300 mg at 10/03/18 1147  . hydrOXYzine (ATARAX/VISTARIL) tablet 50 mg  50 mg Oral Q6H PRN Antonieta Pertlary, Greg Lawson, MD   50 mg at 10/02/18 2220  . lidocaine (LIDODERM) 5 % 1 patch  1 patch Transdermal Daily PRN Aldean BakerSykes, Janet E, NP   1 patch at 09/28/18 1715  . magnesium hydroxide (MILK OF MAGNESIA) suspension 30 mL  30 mL Oral Daily PRN Donell SievertSimon, Spencer E, PA-C      . nicotine (NICODERM CQ - dosed in mg/24 hours) patch 21 mg  21 mg Transdermal Daily Antonieta Pertlary, Greg Lawson, MD   21 mg at 10/03/18 0849  . oseltamivir (TAMIFLU) capsule 75 mg  75 mg Oral Daily Judyann MunsonSnider, Cynthia, MD   75 mg at 10/03/18 0847  . pantoprazole (PROTONIX) EC tablet 40 mg  40 mg Oral Daily Antonieta Pertlary, Greg Lawson, MD   40 mg at 10/03/18 0847  . prazosin (MINIPRESS) capsule 1 mg  1 mg Oral QHS Antonieta Pertlary, Greg Lawson, MD   1 mg at 10/02/18 2220  . QUEtiapine (SEROQUEL) tablet 200 mg  200 mg Oral QHS Zaydan Papesh, Rockey SituFernando A, MD   200 mg at 10/02/18 2220  . QUEtiapine (SEROQUEL) tablet 25 mg  25 mg Oral BID Money, Gerlene Burdockravis B, FNP   25 mg at  10/03/18 1148  . rivaroxaban (XARELTO) tablet 20 mg  20 mg Oral Q supper Aldean BakerSykes, Janet E, NP   20 mg at 10/02/18 1709  . traMADol (ULTRAM) tablet 50 mg  50 mg Oral Q6H PRN Antonieta Pertlary, Greg Lawson, MD   50 mg at 10/03/18 0847  . traZODone (DESYREL) tablet 50 mg  50 mg Oral QHS PRN Myisha Pickerel, Rockey SituFernando A, MD   50 mg at 10/02/18 2221    Lab Results: No results found for this or any previous visit (from the past 48 hour(s)).  Blood Alcohol level:  Lab Results  Component Value Date   ETH <10 09/22/2018    Metabolic Disorder Labs: Lab Results  Component Value Date   HGBA1C 6.0 (H) 09/30/2018   MPG 125.5 09/30/2018   No results found for: PROLACTIN Lab Results  Component Value Date   CHOL 195 09/30/2018   TRIG 105 09/30/2018   HDL 57 09/30/2018   CHOLHDL 3.4 09/30/2018   VLDL 21 09/30/2018   LDLCALC 117 (H) 09/30/2018    Physical Findings: AIMS: Facial and Oral Movements Muscles of Facial Expression: None, normal Lips and Perioral Area: None, normal Jaw: None, normal Tongue: None, normal,Extremity Movements Upper (arms, wrists, hands, fingers): None, normal Lower (legs, knees, ankles, toes): None, normal, Trunk Movements Neck, shoulders, hips: None, normal, Overall Severity Severity of abnormal movements (highest score from questions above): None, normal Incapacitation due to abnormal movements: None, normal Patient's awareness of abnormal movements (rate only patient's report): No Awareness, Dental Status Current problems with teeth and/or dentures?: No Does patient usually wear dentures?: No  CIWA:  CIWA-Ar Total: 1 COWS:  COWS Total Score: 2  Musculoskeletal: Strength & Muscle Tone: within normal limits Gait & Station: normal Patient leans: N/A  Psychiatric Specialty Exam: Physical Exam  Nursing note and vitals reviewed. Constitutional: She is oriented to person, place, and time. She appears well-developed and well-nourished.  Cardiovascular: Normal rate.  Respiratory:  Effort normal.  Musculoskeletal: Normal range of motion.  Neurological: She is alert and oriented to person, place, and time.  Skin: Skin is warm.    Review of Systems  Constitutional: Negative.   HENT: Negative.   Eyes: Negative.   Respiratory: Negative.   Cardiovascular: Negative.   Gastrointestinal: Negative.   Genitourinary: Negative.   Musculoskeletal: Negative.   Skin: Negative.   Neurological: Negative.   Endo/Heme/Allergies: Negative.   Psychiatric/Behavioral: Positive for depression. Negative for hallucinations and suicidal ideas.    Blood pressure 111/82, pulse 96, temperature 97.7 F (36.5 C), temperature source Oral, resp. rate 16, height 5\' 6"  (1.676 m), weight 70.8 kg, last menstrual period 09/22/2018, SpO2 99 %.Body mass index is 25.18 kg/m.  General Appearance: Casual  Eye Contact:  Good  Speech:  Clear and Coherent and Normal Rate  Volume:  Normal  Mood:  Depressed  Affect:  Congruent  Thought Process:  Coherent and Descriptions of Associations: Intact  Orientation:  Full (Time, Place, and Person)  Thought Content:  WDL  Suicidal Thoughts:  No  Homicidal Thoughts:  No  Memory:  Immediate;   Good Recent;   Good Remote;   Good  Judgement:  Fair  Insight:  Fair  Psychomotor Activity:  Normal  Concentration:  Concentration: Good and Attention Span: Good  Recall:  Good  Fund of Knowledge:  Good  Language:  Good  Akathisia:  No  Handed:  Right  AIMS (if indicated):     Assets:  Communication Skills Desire for Improvement Financial Resources/Insurance Housing Physical Health Social Support Transportation  ADL's:  Intact  Cognition:  WNL  Sleep:  Number of Hours: 5.75   Problems Addressed Peripheral edema MDD severe recurrent Chronic low back pain with bilateral sciatica  Treatment Plan Summary: Daily contact with patient to assess and evaluate symptoms and progress in treatment, Medication management and Plan is to:  Decrease Neurontin 300 mg  p.o. 3 times daily Continue prazosin 1 mg p.o. nightly Continue Vistaril 50 mg p.o. every 6 hours as needed for anxiety Continue Seroquel 200 mg p.o. daily for mood stability Start Seroquel 25 mg  p.o. twice daily for mood stability Continue trazodone 50 mg p.o. nightly as needed for insomnia Continue tramadol 50 mg p.o. every 6 hours as needed for sciatica pain Continue prazosin 1 mg p.o. nightly for nightmares Plan is to discontinue Neurontin tomorrow Encourage group therapy participation Order compression stockings for peripheral edema bilateral lower extremities Continue Cymbalta 60 mg p.o. daily  Maryfrances Bunnell, FNP 10/03/2018, 2:09 PM    ..Agree with NP Progress Note

## 2018-10-04 MED ORDER — LURASIDONE HCL 20 MG PO TABS
20.0000 mg | ORAL_TABLET | Freq: Every day | ORAL | Status: DC
  Administered 2018-10-05 – 2018-10-06 (×2): 20 mg via ORAL
  Filled 2018-10-04 (×3): qty 1
  Filled 2018-10-04: qty 7

## 2018-10-04 MED ORDER — FUROSEMIDE 20 MG PO TABS
20.0000 mg | ORAL_TABLET | Freq: Every day | ORAL | Status: DC
  Administered 2018-10-04 – 2018-10-06 (×3): 20 mg via ORAL
  Filled 2018-10-04 (×2): qty 1
  Filled 2018-10-04: qty 7
  Filled 2018-10-04 (×3): qty 1

## 2018-10-04 MED ORDER — HYDROXYZINE HCL 25 MG PO TABS
25.0000 mg | ORAL_TABLET | Freq: Four times a day (QID) | ORAL | Status: DC | PRN
  Administered 2018-10-04 – 2018-10-05 (×2): 25 mg via ORAL
  Filled 2018-10-04 (×2): qty 1
  Filled 2018-10-04: qty 10

## 2018-10-04 MED ORDER — TRAMADOL HCL 50 MG PO TABS
50.0000 mg | ORAL_TABLET | Freq: Two times a day (BID) | ORAL | Status: DC | PRN
  Administered 2018-10-04 – 2018-10-05 (×3): 50 mg via ORAL
  Filled 2018-10-04 (×3): qty 1

## 2018-10-04 NOTE — Progress Notes (Signed)
Recreation Therapy Notes  Date:  2.10.20 Time: 0930 Location: [address] Dayroom  Group Topic: Stress Management  Goal Area(s) Addresses:  Patient will identify positive stress management techniques. Patient will identify benefits of using stress management post d/c.  Intervention: Stress Management  Activity :  Meditation.  LRT introduced the stress management technique of meditation.  LRT played a meditation that focused on being resilient in the face of adversity.  Patients were to listen as meditation played to engage in activity.    Education:  Stress Management, Discharge Planning.   Education Outcome: Acknowledges Education  Clinical Observations/Feedback:  Pt did not attend group.    Caroll Rancher, LRT/CTRS         Caroll Rancher A 10/04/2018 12:24 PM

## 2018-10-04 NOTE — Progress Notes (Signed)
D:Pt rates depression, anxiety and hopelessness as a "12" on 0-10 scale with 10 being the most. On pt self inventory she wrote "not yet" for the question Have you had thoughts about suicide or hurting yourself today? The same question asked verbally by writer, pt responded "no." Pt has been out in the dayroom interacting with peers. She c/o sciatica pain and rated it a "12." A:Gave prn medication for pain. She has been sitting in the dayroom with her legs elevated. Called [employer] to order compression hose and courier service. Offered encouragement and 15 minute checks.  R:Pt verbally denies si and hi. Safety maintained on the unit.

## 2018-10-04 NOTE — [facility] Group Notes (Signed)
LCSW Group Therapy Note 10/04/2018 3:13 PM  Type of Therapy and Topic: Group Therapy: Overcoming Obstacles  Participation Level: Active  Description of Group:  In this group patients will be encouraged to explore what they see as obstacles to their own wellness and recovery. They will be guided to discuss their thoughts, feelings, and behaviors related to these obstacles. The group will process together ways to cope with barriers, with attention given to specific choices patients can make. Each patient will be challenged to identify changes they are motivated to make in order to overcome their obstacles. This group will be process-oriented, with patients participating in exploration of their own experiences as well as giving and receiving support and challenge from other group members.  Therapeutic Goals: 1. Patient will identify personal and current obstacles as they relate to admission. 2. Patient will identify barriers that currently interfere with their wellness or overcoming obstacles.  3. Patient will identify feelings, thought process and behaviors related to these barriers. 4. Patient will identify two changes they are willing to make to overcome these obstacles:   Summary of Patient Progress  Christina [name] was engaged and participated throughout the group session. Christina [name] reports that her main obstacle is "my thoughts". Christina [name] reports that she has learned that her body responds to her negative thoughts and that if she can control her thoughts she can control her overall health. Christina [name] reports that "if you think it, believe, it will come to".    Therapeutic Modalities:  Cognitive Behavioral Therapy Solution Focused Therapy Motivational Interviewing Relapse Prevention Therapy   Alcario Drought Clinical Social Worker

## 2018-10-04 NOTE — Progress Notes (Signed)
Adult Psychoeducational Group Note  Date:  10/04/2018 Time:  10:21 PM  Group Topic/Focus:  Wrap-Up Group:   The focus of this group is to help patients review their daily goal of treatment and discuss progress on daily workbooks.  Participation Level:  Active  Participation Quality:  Appropriate  Affect:  Appropriate  Cognitive:  Appropriate  Insight: Appropriate  Engagement in Group:  Engaged  Modes of Intervention:  Discussion  Additional Comments:  Patient attended group and participated.   Nayquan Evinger W Romen Yutzy 10/04/2018, 10:21 PM

## 2018-10-04 NOTE — [facility] Group Notes (Signed)
[facility] Group Notes:  (Nursing/MHT/Case Management/Adjunct)  Date:  10/04/2018  Time:  4:00 pm  Type of Therapy:  Psychoeducational Skills  Participation Level:  Active  Participation Quality:  Appropriate  Affect:  Appropriate  Cognitive:  Appropriate  Insight:  Appropriate  Engagement in Group:  Engaged  Modes of Intervention:  Education  Summary of Progress/Problems: Patient was alert and appropriate during group.  Christina [name] 10/04/2018, 7:00 PM

## 2018-10-04 NOTE — Tx Team (Signed)
Interdisciplinary Treatment and Diagnostic Plan Update  10/04/2018 Time of Session:  Sonia Sideimuira Hern MRN: [id number]  Principal Diagnosis: MDD (major depressive disorder), recurrent episode, severe (HCC)  Secondary Diagnoses: Principal Problem:   MDD (major depressive disorder), recurrent episode, severe (HCC) Active Problems:   Chronic bilateral low back pain with bilateral sciatica   Edema, peripheral   Current Medications:  Current Facility-Administered Medications  Medication Dose Route Frequency Provider Last Rate Last Dose  . acetaminophen (TYLENOL) tablet 650 mg  650 mg Oral Q6H PRN Kerry HoughSimon, Spencer E, PA-C   650 mg at 10/03/18 1147  . alum & mag hydroxide-simeth (MAALOX/MYLANTA) 200-200-20 MG/5ML suspension 30 mL  30 mL Oral Q4H PRN Kerry HoughSimon, Spencer E, PA-C      . DULoxetine (CYMBALTA) DR capsule 60 mg  60 mg Oral Daily Antonieta Pertlary, Greg Lawson, MD   60 mg at 10/04/18 [id number]  . furosemide (LASIX) tablet 20 mg  20 mg Oral Daily Cobos, Rockey SituFernando A, MD   20 mg at 10/04/18 1123  . gabapentin (NEURONTIN) capsule 300 mg  300 mg Oral TID Money, Gerlene Burdockravis B, FNP   300 mg at 10/04/18 1124  . hydrOXYzine (ATARAX/VISTARIL) tablet 50 mg  50 mg Oral Q6H PRN Antonieta Pertlary, Greg Lawson, MD   50 mg at 10/03/18 2215  . lidocaine (LIDODERM) 5 % 1 patch  1 patch Transdermal Daily PRN Aldean BakerSykes, Janet E, NP   1 patch at 09/28/18 1715  . [START ON 10/05/2018] lurasidone (LATUDA) tablet 20 mg  20 mg Oral Q breakfast Cobos, Fernando A, MD      . magnesium hydroxide (MILK OF MAGNESIA) suspension 30 mL  30 mL Oral Daily PRN Donell SievertSimon, Spencer E, PA-C      . nicotine (NICODERM CQ - dosed in mg/24 hours) patch 21 mg  21 mg Transdermal Daily Antonieta Pertlary, Greg Lawson, MD   21 mg at 10/04/18 0829  . oseltamivir (TAMIFLU) capsule 75 mg  75 mg Oral Daily Judyann MunsonSnider, Cynthia, MD   75 mg at 10/04/18 0829  . pantoprazole (PROTONIX) EC tablet 40 mg  40 mg Oral Daily Antonieta Pertlary, Greg Lawson, MD   40 mg at 10/04/18 [id number]  . prazosin (MINIPRESS) capsule 1 mg  1 mg  Oral QHS Antonieta Pertlary, Greg Lawson, MD   1 mg at 10/03/18 2215  . rivaroxaban (XARELTO) tablet 20 mg  20 mg Oral Q supper Aldean BakerSykes, Janet E, NP   20 mg at 10/03/18 1732  . traMADol (ULTRAM) tablet 50 mg  50 mg Oral Q6H PRN Antonieta Pertlary, Greg Lawson, MD   50 mg at 10/04/18 [id number]  . traZODone (DESYREL) tablet 50 mg  50 mg Oral QHS PRN Cobos, Rockey SituFernando A, MD   50 mg at 10/03/18 2215   PTA Medications: Medications Prior to Admission  Medication Sig Dispense Refill Last Dose  . acetaminophen (TYLENOL) 500 MG tablet Take 500 mg by mouth every 6 (six) hours as needed.   09/21/2018 at Unknown time  . acetaminophen (TYLENOL) 650 MG CR tablet Take 975 mg by mouth daily as needed for pain.   09/21/2018 at Unknown time  . amLODipine (NORVASC) 10 MG tablet Take 1 tablet (10 mg total) by mouth daily. (Patient not taking: Reported on 09/22/2018) 90 tablet 1 Not Taking at Unknown time  . carvedilol (COREG) 12.5 MG tablet Take 1 tablet (12.5 mg total) by mouth 2 (two) times daily with a meal. (Patient not taking: Reported on 09/22/2018) 90 tablet 1 Not Taking at Unknown time  . escitalopram (LEXAPRO) 20 MG tablet  Take 1 tablet (20 mg total) by mouth daily. (Patient not taking: Reported on 09/22/2018) 90 tablet 0 Not Taking at Unknown time  . gabapentin (NEURONTIN) 300 MG capsule Take 2 capsules (600 mg total) by mouth 3 (three) times daily. (Patient not taking: Reported on 09/22/2018) 180 capsule 3 Not Taking at Unknown time  . HYDROcodone-acetaminophen (NORCO) 5-325 MG tablet Take 1 tablet by mouth at bedtime. (Patient not taking: Reported on 09/22/2018) 14 tablet 0 Not Taking at Unknown time  . ibuprofen (ADVIL,MOTRIN) 200 MG tablet Take 600 mg by mouth daily as needed for moderate pain.   Past Week at Unknown time  . nitrofurantoin, macrocrystal-monohydrate, (MACROBID) 100 MG capsule Take 1 capsule (100 mg total) by mouth 2 (two) times daily. (Patient not taking: Reported on 09/22/2018) 10 capsule 0 Not Taking at Unknown time  . QUEtiapine  (SEROQUEL) 200 MG tablet Take 1 tablet (200 mg total) by mouth at bedtime. (Patient not taking: Reported on 09/22/2018) 90 tablet 0 Not Taking at Unknown time  . rivaroxaban (XARELTO) 20 MG TABS tablet Take 1 tablet (20 mg total) by mouth daily with supper. (Patient not taking: Reported on 09/22/2018) 90 tablet 3 Not Taking at Unknown time  . SUMAtriptan (IMITREX) 25 MG tablet Take 1 tablet (25 mg total) by mouth daily. May take one more tablet two hours after the first. No more than two tablets per day. (Patient not taking: Reported on 09/22/2018) 9 tablet 1 Not Taking at Unknown time  . topiramate (TOPAMAX) 25 MG tablet TAKE 1 TABLET BY MOUTH 2 TIMES DAILY. (Patient not taking: Reported on 09/22/2018) 60 tablet 1 Not Taking at Unknown time    Patient Stressors: Health problems Marital or family conflict  Patient Strengths: Ability for insight Average or above average intelligence Capable of independent living Communication skills General fund of knowledge Motivation for treatment/growth  Treatment Modalities: Medication Management, Group therapy, Case management,  1 to 1 session with clinician, Psychoeducation, Recreational therapy.   Physician Treatment Plan for Primary Diagnosis: MDD (major depressive disorder), recurrent episode, severe (HCC) Long Term Goal(s): Improvement in symptoms so as ready for discharge Improvement in symptoms so as ready for discharge   Short Term Goals: Ability to identify changes in lifestyle to reduce recurrence of condition will improve Ability to verbalize feelings will improve Ability to disclose and discuss suicidal ideas Ability to demonstrate self-control will improve Ability to identify and develop effective coping behaviors will improve Ability to maintain clinical measurements within normal limits will improve Compliance with prescribed medications will improve Ability to identify triggers associated with substance abuse/mental health issues will  improve Ability to identify changes in lifestyle to reduce recurrence of condition will improve Ability to verbalize feelings will improve Ability to disclose and discuss suicidal ideas Ability to demonstrate self-control will improve Ability to identify and develop effective coping behaviors will improve Ability to maintain clinical measurements within normal limits will improve Compliance with prescribed medications will improve Ability to identify triggers associated with substance abuse/mental health issues will improve  Medication Management: Evaluate patient's response, side effects, and tolerance of medication regimen.  Therapeutic Interventions: 1 to 1 sessions, Unit Group sessions and Medication administration.  Evaluation of Outcomes: Adequate for Discharge  Physician Treatment Plan for Secondary Diagnosis: Principal Problem:   MDD (major depressive disorder), recurrent episode, severe (HCC) Active Problems:   Chronic bilateral low back pain with bilateral sciatica   Edema, peripheral  Long Term Goal(s): Improvement in symptoms so as ready for discharge Improvement  in symptoms so as ready for discharge   Short Term Goals: Ability to identify changes in lifestyle to reduce recurrence of condition will improve Ability to verbalize feelings will improve Ability to disclose and discuss suicidal ideas Ability to demonstrate self-control will improve Ability to identify and develop effective coping behaviors will improve Ability to maintain clinical measurements within normal limits will improve Compliance with prescribed medications will improve Ability to identify triggers associated with substance abuse/mental health issues will improve Ability to identify changes in lifestyle to reduce recurrence of condition will improve Ability to verbalize feelings will improve Ability to disclose and discuss suicidal ideas Ability to demonstrate self-control will improve Ability to  identify and develop effective coping behaviors will improve Ability to maintain clinical measurements within normal limits will improve Compliance with prescribed medications will improve Ability to identify triggers associated with substance abuse/mental health issues will improve     Medication Management: Evaluate patient's response, side effects, and tolerance of medication regimen.  Therapeutic Interventions: 1 to 1 sessions, Unit Group sessions and Medication administration.  Evaluation of Outcomes: Adequate for Discharge   RN Treatment Plan for Primary Diagnosis: MDD (major depressive disorder), recurrent episode, severe (HCC) Long Term Goal(s): Knowledge of disease and therapeutic regimen to maintain health will improve  Short Term Goals: Ability to participate in decision making will improve, Ability to verbalize feelings will improve, Ability to disclose and discuss suicidal ideas, Ability to identify and develop effective coping behaviors will improve and Compliance with prescribed medications will improve  Medication Management: RN will administer medications as ordered by provider, will assess and evaluate patient's response and provide education to patient for prescribed medication. RN will report any adverse and/or side effects to prescribing provider.  Therapeutic Interventions: 1 on 1 counseling sessions, Psychoeducation, Medication administration, Evaluate responses to treatment, Monitor vital signs and CBGs as ordered, Perform/monitor CIWA, COWS, AIMS and Fall Risk screenings as ordered, Perform wound care treatments as ordered.  Evaluation of Outcomes: Adequate for Discharge   LCSW Treatment Plan for Primary Diagnosis: MDD (major depressive disorder), recurrent episode, severe (HCC) Long Term Goal(s): Safe transition to appropriate next level of care at discharge, Engage patient in therapeutic group addressing interpersonal concerns.  Short Term Goals: Engage patient  in aftercare planning with referrals and resources  Therapeutic Interventions: Assess for all discharge needs, 1 to 1 time with Social worker, Explore available resources and support systems, Assess for adequacy in community support network, Educate family and significant other(s) on suicide prevention, Complete Psychosocial Assessment, Interpersonal group therapy.  Evaluation of Outcomes: Adequate for Discharge   Progress in Treatment: Attending groups: Yes. Participating in groups: Yes. Taking medication as prescribed: Yes. Toleration medication: Yes. Family/Significant other contact made: No, will contact:  patient declined consent for collateral contacts Patient understands diagnosis: Yes. Discussing patient identified problems/goals with staff: Yes. Medical problems stabilized or resolved: Yes. Denies suicidal/homicidal ideation: Yes. Issues/concerns per patient self-inventory: No. Other:   New problem(s) identified: None   New Short Term/Long Term Goal(s): medication stabilization, elimination of SI thoughts, development of comprehensive mental wellness plan.    Patient Goals: I don't know what I need but I know I need help    Discharge Plan or Barriers: Patient reports that she is no longer returning to her ex-boyfriend's home. The patient states that she does not have a plan for housing at this time. CSW provided additional housing resources. The patient plans to follow up with [facility] for outpatient medication management and therapy services. CSW  will continue to follow and assess for additional referrals and discharge planning.   Reason for Continuation of Hospitalization: Depression Medication stabilization Suicidal ideation  Estimated Length of Stay: 10/05/2018  Attendees: Patient: 10/04/2018 11:27 AM  Physician: Dr. Landry MellowGreg Clary, MD; Dr. Nehemiah MassedFernando Cobos, MD 10/04/2018 11:27 AM  Nursing: Rayfield Citizenaroline.B, RN; Casimiro NeedleMichael.S, RN; Jan.W, RN 10/04/2018 11:27 AM  RN Care Manager:   10/04/2018 11:27 AM  Social Worker: Baldo DaubJolan Shizuko Wojdyla, LCSWA 10/04/2018 11:27 AM  Recreational Therapist:  10/04/2018 11:27 AM  Other: Marciano SequinJanet Sykes, NP  10/04/2018 11:27 AM  Other:  10/04/2018 11:27 AM  Other: 10/04/2018 11:27 AM    Scribe for Treatment Team: Maeola SarahJolan E Eura Radabaugh, LCSWA 10/04/2018 11:27 AM

## 2018-10-04 NOTE — Progress Notes (Signed)
[facility]  10/04/2018 3:16 PM Christina [name]  MRN:  [id number]   Subjective: Patient reports some lingering depression, acknowledges partial improvement in mood compared to admission.  Concerned about lower extremity edema, which she noted 2 to 3 days ago and which has "gotten worse".  Today denies suicidal ideations.   Objective: Chart notes reviewed, patient seen, case discussed with treatment team. Christina [name]  is a 48 year old female with a history of mood disorder. She  has been diagnosed with bipolar disorder in the past.  Presented with worsening depression and suicidal ideations in the context of significant losses, including death of her mother 6 months ago, unstable relationship with boyfriend, unstable housing situation. She has a history of chronic lower back pain, which refers to lower extremities, as contributing stressors.  Patient reports partially improved mood and affect does present more reactive, fuller in range.  She has reported bilateral lower extremity pitting edema.  Because she has a history of DVT, bilateral  lower extremity Doppler/ Vascular US was done, was negative for DVT bilaterally, enlarged lymph nodes in groin area noted. Of note patient does not have symptoms of CHF at this time-no dyspnea at room air , no nocturnal paroxystic dyspnea ,denies orthopnea. At this time it is felt that edema most likely related to Seroquel, as it appears to have generally coincided with Seroquel management/titration.  Patient states that Kasandra KnudsenLatuda was well-tolerated and effective in the past.  Denies suicidal or homicidal ideations. Visible in dayroom, no disruptive or agitated behaviors.  Principal Problem: MDD (major depressive disorder), recurrent episode, severe (HCC) Diagnosis: Principal Problem:   MDD (major depressive disorder), recurrent episode, severe (HCC) Active Problems:   Chronic bilateral low back pain with bilateral sciatica   Edema, peripheral  Total  Time spent with patient: 20 minutes  Past Psychiatric History: See H&P  Past Medical History:  Past Medical History:  Diagnosis Date  . Anxiety   . Back pain   . 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 INTRAVASCULAR ULTRASOUND/IVUS Left 11/03/2016   Procedure: Intravascular Ultrasound/IVUS;  Surgeon: Maeola HarmanBrandon Christopher Cain, MD;  [facility];  Service: Cardiovascular;  Laterality: Left;  . LOWER EXTREMITY VENOGRAPHY Left 11/04/2016   Procedure: Lower Extremity Venography;  Surgeon: Nada LibmanVance W Brabham, MD;  [facility];  Service: Cardiovascular;  Laterality: Left;  . PERIPHERAL VASCULAR INTERVENTION Left 11/04/2016   Procedure: Peripheral Vascular Intervention;  Surgeon: Nada LibmanVance W Brabham, MD;  [facility];  Service: Cardiovascular;  Laterality: Left;  common external  . PERIPHERAL VASCULAR THROMBECTOMY Left 11/03/2016   Procedure: Peripheral Vascular Thrombectomy;  Surgeon: Maeola HarmanBrandon Christopher Cain, MD;  [facility];  Service: Cardiovascular;  Laterality: Left;   Family History: History reviewed. No pertinent family history. Family Psychiatric  History: See H&P Social History:  Social History   Substance and Sexual Activity  Alcohol Use No     Social History   Substance and Sexual Activity  Drug Use No    Social History   Socioeconomic History  . Marital status: Divorced    Spouse name: Not on file  . Number of children: Not on file  . Years of education: Not on file  . Highest education level: Not on file  Occupational History  . Not on file  Social Needs  . Financial resource strain: Very hard  . Food  insecurity:    Worry: Sometimes true    Inability: Sometimes true  . Transportation needs:    Medical: No    Non-medical: No  Tobacco Use  . Smoking status: Current Every Day Smoker    Packs/day: 0.25    Types: Cigarettes  .  Smokeless tobacco: Never Used  Substance and Sexual Activity  . Alcohol use: No  . Drug use: No  . Sexual activity: Yes    Birth control/protection: Other-see comments    Comment: Tubal ligation  Lifestyle  . Physical activity:    Days per week: 2 days    Minutes per session: 10 min  . Stress: Very much  Relationships  . Social connections:    Talks on phone: Three times a week    Gets together: More than three times a week    Attends religious service: Never    Active member of club or organization: No    Attends meetings of clubs or organizations: Never    Relationship status: Divorced  Other Topics Concern  . Not on file  Social History Narrative  . Not on file   Additional Social History:   Sleep: Good  Appetite:  Good  Current Medications: Current Facility-Administered Medications  Medication Dose Route Frequency Provider Last Rate Last Dose  . acetaminophen (TYLENOL) tablet 650 mg  650 mg Oral Q6H PRN Kerry Hough, PA-C   650 mg at 10/03/18 1147  . alum & mag hydroxide-simeth (MAALOX/MYLANTA) 200-200-20 MG/5ML suspension 30 mL  30 mL Oral Q4H PRN Kerry Hough, PA-C      . DULoxetine (CYMBALTA) DR capsule 60 mg  60 mg Oral Daily Antonieta Pert, MD   60 mg at 10/04/18 9604  . furosemide (LASIX) tablet 20 mg  20 mg Oral Daily Cobos, Rockey Situ, MD   20 mg at 10/04/18 1123  . gabapentin (NEURONTIN) capsule 300 mg  300 mg Oral TID Money, Gerlene Burdock, FNP   300 mg at 10/04/18 1124  . hydrOXYzine (ATARAX/VISTARIL) tablet 50 mg  50 mg Oral Q6H PRN Antonieta Pert, MD   50 mg at 10/03/18 2215  . lidocaine (LIDODERM) 5 % 1 patch  1 patch Transdermal Daily PRN Aldean Baker, NP   1 patch at 09/28/18 1715  . [START ON 10/05/2018] lurasidone (LATUDA) tablet 20 mg  20 mg Oral Q breakfast Cobos, Fernando A, MD      . magnesium hydroxide (MILK OF MAGNESIA) suspension 30 mL  30 mL Oral Daily PRN Donell Sievert E, PA-C      . nicotine (NICODERM CQ - dosed in mg/24 hours)  patch 21 mg  21 mg Transdermal Daily Antonieta Pert, MD   21 mg at 10/04/18 0829  . oseltamivir (TAMIFLU) capsule 75 mg  75 mg Oral Daily Judyann Munson, MD   75 mg at 10/04/18 0829  . pantoprazole (PROTONIX) EC tablet 40 mg  40 mg Oral Daily Antonieta Pert, MD   40 mg at 10/04/18 5409  . prazosin (MINIPRESS) capsule 1 mg  1 mg Oral QHS Antonieta Pert, MD   1 mg at 10/03/18 2215  . rivaroxaban (XARELTO) tablet 20 mg  20 mg Oral Q supper Aldean Baker, NP   20 mg at 10/03/18 1732  . traMADol (ULTRAM) tablet 50 mg  50 mg Oral Q6H PRN Antonieta Pert, MD   50 mg at 10/04/18 8119  . traZODone (DESYREL) tablet 50 mg  50 mg Oral QHS PRN Cobos,  Rockey Situ, MD   50 mg at 10/03/18 2215    Lab Results: No results found for this or any previous visit (from the past 48 hour(s)).  Blood Alcohol level:  Lab Results  Component Value Date   ETH <10 09/22/2018    Metabolic Disorder Labs: Lab Results  Component Value Date   HGBA1C 6.0 (H) 09/30/2018   MPG 125.5 09/30/2018   No results found for: PROLACTIN Lab Results  Component Value Date   CHOL 195 09/30/2018   TRIG 105 09/30/2018   HDL 57 09/30/2018   CHOLHDL 3.4 09/30/2018   VLDL 21 09/30/2018   LDLCALC 117 (H) 09/30/2018    Physical Findings: AIMS: Facial and Oral Movements Muscles of Facial Expression: None, normal Lips and Perioral Area: None, normal Jaw: None, normal Tongue: None, normal,Extremity Movements Upper (arms, wrists, hands, fingers): None, normal Lower (legs, knees, ankles, toes): None, normal, Trunk Movements Neck, shoulders, hips: None, normal, Overall Severity Severity of abnormal movements (highest score from questions above): None, normal Incapacitation due to abnormal movements: None, normal Patient's awareness of abnormal movements (rate only patient's report): No Awareness, Dental Status Current problems with teeth and/or dentures?: No Does patient usually wear dentures?: No  CIWA:  CIWA-Ar  Total: 1 COWS:  COWS Total Score: 2  Musculoskeletal: Strength & Muscle Tone: within normal limits Gait & Station: normal Patient leans: N/A  Psychiatric Specialty Exam: Physical Exam  Nursing note and vitals reviewed. Constitutional: She is oriented to person, place, and time. She appears well-developed and well-nourished.  Cardiovascular: Normal rate.  Respiratory: Effort normal.  Musculoskeletal: Normal range of motion.  Neurological: She is alert and oriented to person, place, and time.  Skin: Skin is warm.    Review of Systems  Constitutional: Negative.   HENT: Negative.   Eyes: Negative.   Respiratory: Negative.   Cardiovascular: Negative.   Gastrointestinal: Negative.   Genitourinary: Negative.   Musculoskeletal: Negative.   Skin: Negative.   Neurological: Negative.   Endo/Heme/Allergies: Negative.   Psychiatric/Behavioral: Positive for depression. Negative for hallucinations and suicidal ideas.  No chest pain, no shortness of breath, no vomiting, bilateral lower extremity pitting edema  Blood pressure 117/85, pulse 78, temperature 98 F (36.7 C), temperature source Oral, resp. rate 16, height 5\' 6"  (1.676 m), weight 70.8 kg, last menstrual period 09/22/2018, SpO2 99 %.Body mass index is 25.18 kg/m.  General Appearance: Casual  Eye Contact:  Good  Speech:  Normal Rate  Volume:  Normal  Mood:  Gradually improving mood  Affect:  Mildly constricted, improves during session, overall fuller in range  Thought Process:  Linear and Descriptions of Associations: Intact  Orientation:  Full (Time, Place, and Person)  Thought Content:  No hallucinations, no delusions, not internally preoccupied  Suicidal Thoughts:  No-denies suicidal or self-injurious ideations at this time  Homicidal Thoughts:  No  Memory:  Recent and remote grossly intact  Judgement:  Other:  Improving  Insight:  Fair/improving  Psychomotor Activity:  Normal  Concentration:  Concentration: Good and  Attention Span: Good  Recall:  Good  Fund of Knowledge:  Good  Language:  Good  Akathisia:  No  Handed:  Right  AIMS (if indicated):     Assets:  Communication Skills Desire for Improvement Financial Resources/Insurance Housing Physical Health Social Support Transportation  ADL's:  Intact  Cognition:  WNL  Sleep:  Number of Hours: 5.5   Assessment -  Christina [name]  is a 48 year old female with a history of mood disorder.  She  has been diagnosed with bipolar disorder in the past.  Presented with worsening depression and suicidal ideations in the context of significant losses, including death of her mother 6 months ago, unstable relationship with boyfriend, unstable housing situation. She has a history of chronic lower back pain, which refers to lower extremities, as contributing stressors.  Patient presents with gradually improving mood and a more reactive affect.  No suicidal ideations.  Has developed bilateral lower extremity pitting edema.  Does not endorse symptoms of CHF and no dyspnea is noted or reported.  Bilateral lower extremity vascular ultrasound done-negative for DVT.  It is felt that edema likely secondary to Seroquel.  Patient reports history of good tolerance and response to Latuda in the past, would consider switching to JordanLatuda.  I have discussed case with hospitalist consultant-we will initiate Lasix 20 mg daily.  Check BMP in a.m. to monitor electrolytes.   Treatment Plan Summary: Daily contact with patient to assess and evaluate symptoms and progress in treatment, Medication management and Plan is to:  Encourage group and milieu participation to work on coping skills and symptom reduction Continue  Neurontin 300 mg p.o. 3 times daily for pain, anxiety Continue Prazosin 1 mg p.o. nightly Continue Vistaril 50 mg p.o. every 6 hours as needed for anxiety Discontinue Seroquel-see rationale above Start Latuda 20 mg daily for mood disorder Continue Trazodone 50 mg p.o.  nightly as needed for insomnia Continue tramadol 50 mg p.o. every 6 hours as needed for sciatica pain Compression stockings for peripheral edema bilateral lower extremities Continue Cymbalta 60 mg p.o. daily Lasix 20 mgrs QDAY for edema Check BMP in AM Treatment team working on disposition planning   Craige CottaFernando A Cobos, MD 10/04/2018, 3:16 PM    Patient ID: Christina [name], female   DOB: [birth year], 48 y.o.   MRN: [id number]

## 2018-10-05 LAB — BASIC METABOLIC PANEL
Anion gap: 7 (ref 5–15)
BUN: 8 mg/dL (ref 6–20)
CO2: 25 mmol/L (ref 22–32)
Calcium: 8.1 mg/dL — ABNORMAL LOW (ref 8.9–10.3)
Chloride: 102 mmol/L (ref 98–111)
Creatinine, Ser: 0.72 mg/dL (ref 0.44–1.00)
GFR calc Af Amer: >60 mL/min (ref >60)
GFR calc non Af Amer: >60 mL/min (ref >60)
Glucose, Bld: 97 mg/dL (ref 70–99)
Potassium: 4.1 mmol/L (ref 3.5–5.1)
Sodium: 134 mmol/L — ABNORMAL LOW (ref 135–145)

## 2018-10-05 NOTE — Progress Notes (Signed)
Patient ID: Christina [name], female   DOB: 04-24-1971, 48 y.o.   MRN: [id number] D: Patient observed sitting in dayroom watching TV. Pt c/o of pain rating as 8 on 0-10 scale. Pt reports she feels blessed and there is a purpose for her life because God allowed he to see another day. Pt presented with depressed mood and flat affect. Pt attended evening wrap up group. Denies  SI/HI/AVH.No behavioral issues noted.  A: Support and encouragement offered as needed to express needs. Medications administered as prescribed.  R: Patient is safe and cooperative on unit. Will continue to monitor  for safety and stability.

## 2018-10-05 NOTE — Plan of Care (Signed)
  Problem: Activity: Goal: Interest or engagement in activities will improve Outcome: Progressing   Problem: Safety: Goal: Periods of time without injury will increase Outcome: Progressing  DAR NOTE: Patient presents with anxious affect and depressed mood.  Report poor night sleep due to going to the bathroom frequently.  Patient on antidiuretic medication for bilateral edema.  Denies suicidal thoughts, auditory and visual hallucinations.  Described energy level as normal and concentration as good.  Rates depression at 8, hopelessness at 7, and anxiety at 9.  Maintained on routine safety checks.  Medications given as prescribed.  Support and encouragement offered as needed.  Attended group and participated.  States goal for today is "getting the swelling down on my legs and feet."  Patient observed socializing with peers in the dayroom.  Patient remain safe on and off the unit.

## 2018-10-05 NOTE — Progress Notes (Signed)
[facility] MD Progress Note  10/05/2018 11:46 AM Christina [name]  MRN:  [id number] Subjective:  "I'm nervous. I've got a lot of changes in my life."  Ms. Pau participating in group therapy. She reports some improvement in mood and presents with fuller range of affect. She continues to have BLE edema +1 today. Reports discomfort in her feet related to swelling. Patient continues on Xarelto and Doppler was negative for DVT bilaterally. She denies SOB or orthopnea. Compression stockings are ordered but have not arrived to unit yet. Patient was encouraged to elevate feet. Denies SI/HI, AVH. She complains of difficulty sleeping related to waking up to void overnight (started on Lasix yesterday). Lasix given at earlier time today. Seroquel was discontinued yesterday due to peripheral edema, and patient starting Latuda. Denies medication side effects. She expresses understanding for scheduled discharge tomorrow. She will be staying with her ex-husband's niece.   From admission H&P: Patient is a 48 year old female with a past psychiatric history significant for possible bipolar disorder, major depression and anxiety. She was previously seen at [facility] behavioral health as an outpatient until 4 months ago when she lost her insurance. She had her first appointment at [facility] on the day of admission, and they sent her to the emergency department secondary to suicidal ideation. The patient stated she had not been on medications in a while. She stated that previously she had had medication, and it would initially work, but then would stop. She complained of musculoskeletal pain. She also reported problems with relationships with a boyfriend, and the death of her mother who died 6 months ago. She has previously been treated with lithium, Depakote, Latuda, gabapentin, Topamax, Risperdal.   Principal Problem: MDD (major depressive disorder), recurrent episode, severe (HCC) Diagnosis: Principal Problem:   MDD (major  depressive disorder), recurrent episode, severe (HCC) Active Problems:   Chronic bilateral low back pain with bilateral sciatica   Edema, peripheral  Total Time spent with patient: 15 minutes  Past Psychiatric History: See admission H&P  Past Medical History:  Past Medical History:  Diagnosis Date  . Anxiety   . Back pain   . 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 INTRAVASCULAR ULTRASOUND/IVUS Left 11/03/2016   Procedure: Intravascular Ultrasound/IVUS;  Surgeon: Maeola Harman, MD;  Location: [facility] INVASIVE CV LAB;  Service: Cardiovascular;  Laterality: Left;  . LOWER EXTREMITY VENOGRAPHY Left 11/04/2016   Procedure: Lower Extremity Venography;  Surgeon: Nada Libman, MD;  Location: [facility] INVASIVE CV LAB;  Service: Cardiovascular;  Laterality: Left;  . PERIPHERAL VASCULAR INTERVENTION Left 11/04/2016   Procedure: Peripheral Vascular Intervention;  Surgeon: Nada Libman, MD;  Location: [facility] INVASIVE CV LAB;  Service: Cardiovascular;  Laterality: Left;  common external  . PERIPHERAL VASCULAR THROMBECTOMY Left 11/03/2016   Procedure: Peripheral Vascular Thrombectomy;  Surgeon: Maeola Harman, MD;  Location: [facility] INVASIVE CV LAB;  Service: Cardiovascular;  Laterality: Left;   Family History: History reviewed. No pertinent family history. Family Psychiatric  History: See admission H&P Social History:  Social History   Substance and Sexual Activity  Alcohol Use No     Social History   Substance and Sexual Activity  Drug Use No    Social History   Socioeconomic History  . Marital status: Divorced    Spouse name: Not on file  . Number of children: Not on file  . Years of education: Not on file  .  Highest education level: Not on file  Occupational History  . Not on file  Social Needs  . Financial resource strain: Very hard  . Food insecurity:    Worry: Sometimes true    Inability:  Sometimes true  . Transportation needs:    Medical: No    Non-medical: No  Tobacco Use  . Smoking status: Current Every Day Smoker    Packs/day: 0.25    Types: Cigarettes  . Smokeless tobacco: Never Used  Substance and Sexual Activity  . Alcohol use: No  . Drug use: No  . Sexual activity: Yes    Birth control/protection: Other-see comments    Comment: Tubal ligation  Lifestyle  . Physical activity:    Days per week: 2 days    Minutes per session: 10 min  . Stress: Very much  Relationships  . Social connections:    Talks on phone: Three times a week    Gets together: More than three times a week    Attends religious service: Never    Active member of club or organization: No    Attends meetings of clubs or organizations: Never    Relationship status: Divorced  Other Topics Concern  . Not on file  Social History Narrative  . Not on file   Additional Social History:                         Sleep: Fair  Appetite:  Good  Current Medications: Current Facility-Administered Medications  Medication Dose Route Frequency Provider Last Rate Last Dose  . acetaminophen (TYLENOL) tablet 650 mg  650 mg Oral Q6H PRN Kerry HoughSimon, Spencer E, PA-C   650 mg at 10/03/18 1147  . alum & mag hydroxide-simeth (MAALOX/MYLANTA) 200-200-20 MG/5ML suspension 30 mL  30 mL Oral Q4H PRN Kerry HoughSimon, Spencer E, PA-C      . DULoxetine (CYMBALTA) DR capsule 60 mg  60 mg Oral Daily Antonieta Pertlary, Greg Lawson, MD   60 mg at 10/05/18 0810  . furosemide (LASIX) tablet 20 mg  20 mg Oral Daily Cobos, Rockey SituFernando A, MD   20 mg at 10/05/18 0810  . gabapentin (NEURONTIN) capsule 300 mg  300 mg Oral TID Money, Gerlene Burdockravis B, FNP   300 mg at 10/05/18 0810  . hydrOXYzine (ATARAX/VISTARIL) tablet 25 mg  25 mg Oral Q6H PRN Cobos, Rockey SituFernando A, MD   25 mg at 10/04/18 2227  . lidocaine (LIDODERM) 5 % 1 patch  1 patch Transdermal Daily PRN Aldean BakerSykes, Cadence Haslam E, NP   1 patch at 09/28/18 1715  . lurasidone (LATUDA) tablet 20 mg  20 mg Oral Q  breakfast Cobos, Rockey SituFernando A, MD   20 mg at 10/05/18 0811  . magnesium hydroxide (MILK OF MAGNESIA) suspension 30 mL  30 mL Oral Daily PRN Donell SievertSimon, Spencer E, PA-C      . nicotine (NICODERM CQ - dosed in mg/24 hours) patch 21 mg  21 mg Transdermal Daily Antonieta Pertlary, Greg Lawson, MD   21 mg at 10/04/18 0829  . oseltamivir (TAMIFLU) capsule 75 mg  75 mg Oral Daily Judyann MunsonSnider, Cynthia, MD   75 mg at 10/05/18 0810  . pantoprazole (PROTONIX) EC tablet 40 mg  40 mg Oral Daily Antonieta Pertlary, Greg Lawson, MD   40 mg at 10/05/18 0810  . prazosin (MINIPRESS) capsule 1 mg  1 mg Oral QHS Antonieta Pertlary, Greg Lawson, MD   1 mg at 10/04/18 2227  . rivaroxaban (XARELTO) tablet 20 mg  20 mg Oral Q supper  Aldean Baker, NP   20 mg at 10/04/18 1711  . traMADol (ULTRAM) tablet 50 mg  50 mg Oral Q12H PRN Cobos, Rockey Situ, MD   50 mg at 10/04/18 2226  . traZODone (DESYREL) tablet 50 mg  50 mg Oral QHS PRN Cobos, Rockey Situ, MD   50 mg at 10/04/18 2227    Lab Results:  Results for orders placed or performed during the hospital encounter of 09/22/18 (from the past 48 hour(s))  Basic metabolic panel     Status: Abnormal   Collection Time: 10/05/18  6:35 AM  Result Value Ref Range   Sodium 134 (L) 135 - 145 mmol/L   Potassium 4.1 3.5 - 5.1 mmol/L   Chloride 102 98 - 111 mmol/L   CO2 25 22 - 32 mmol/L   Glucose, Bld 97 70 - 99 mg/dL   BUN 8 6 - 20 mg/dL   Creatinine, Ser 0.63 0.44 - 1.00 mg/dL   Calcium 8.1 (L) 8.9 - 10.3 mg/dL   GFR calc non Af Amer >60 >60 mL/min   GFR calc Af Amer >60 >60 mL/min   Anion gap 7 5 - 15    Comment: Performed at [facility], [address] [place], [place] [address]    Blood Alcohol level:  Lab Results  Component Value Date   ETH <10 09/22/2018    Metabolic Disorder Labs: Lab Results  Component Value Date   HGBA1C 6.0 (H) 09/30/2018   MPG 125.5 09/30/2018   No results found for: PROLACTIN Lab Results  Component Value Date   CHOL 195 09/30/2018   TRIG 105 09/30/2018   HDL  57 09/30/2018   CHOLHDL 3.4 09/30/2018   VLDL 21 09/30/2018   LDLCALC 117 (H) 09/30/2018    Physical Findings: AIMS: Facial and Oral Movements Muscles of Facial Expression: None, normal Lips and Perioral Area: None, normal Jaw: None, normal Tongue: None, normal,Extremity Movements Upper (arms, wrists, hands, fingers): None, normal Lower (legs, knees, ankles, toes): None, normal, Trunk Movements Neck, shoulders, hips: None, normal, Overall Severity Severity of abnormal movements (highest score from questions above): None, normal Incapacitation due to abnormal movements: None, normal Patient's awareness of abnormal movements (rate only patient's report): No Awareness, Dental Status Current problems with teeth and/or dentures?: No Does patient usually wear dentures?: No  CIWA:  CIWA-Ar Total: 1 COWS:  COWS Total Score: 2  Musculoskeletal: Strength & Muscle Tone: within normal limits Gait & Station: normal Patient leans: N/A  Psychiatric Specialty Exam: Physical Exam  Nursing note and vitals reviewed. Constitutional: She is oriented to person, place, and time. She appears well-developed and well-nourished.  Cardiovascular: Normal rate.  Respiratory: Effort normal.  Neurological: She is alert and oriented to person, place, and time.    Review of Systems  Constitutional: Negative.   Respiratory: Negative.   Cardiovascular: Negative.   Psychiatric/Behavioral: Positive for depression (improving) and substance abuse (UDS +THC). Negative for hallucinations, memory loss and suicidal ideas. The patient is not nervous/anxious and does not have insomnia.     Blood pressure 106/87, pulse 79, temperature 98 F (36.7 C), temperature source Oral, resp. rate 16, height 5\' 6"  (1.676 m), weight 70.8 kg, last menstrual period 09/22/2018, SpO2 99 %.Body mass index is 25.18 kg/m.  General Appearance: Casual  Eye Contact:  Good  Speech:  Normal Rate  Volume:  Normal  Mood:  Depressed   Affect:  Constricted  Thought Process:  Coherent  Orientation:  Full (Time, Place, and Person)  Thought Content:  WDL  Suicidal Thoughts:  No  Homicidal Thoughts:  No  Memory:  Immediate;   Good Recent;   Fair  Judgement:  Fair  Insight:  Fair  Psychomotor Activity:  Normal  Concentration:  Concentration: Good  Recall:  Fair  Fund of Knowledge:  Good  Language:  Good  Akathisia:  No  Handed:  Right  AIMS (if indicated):     Assets:  Communication Skills Desire for Improvement Resilience Social Support  ADL's:  Intact  Cognition:  WNL  Sleep:  Number of Hours: 5     Treatment Plan Summary: Daily contact with patient to assess and evaluate symptoms and progress in treatment and Medication management   Continue inpatient hospitalization.  Continue Cymbalta 60 mg PO daily for mood Continue Latuda 20 mg PO daily for mood Continue prazosin 1 mg PO QHS for nightmares Continue trazodone 50 mg PO QHS PRN insomnia Continue Lasix 20 mg PO daily for edema Continue Xarelto 20 mg PO daily for DVT prophylaxis Continue gabapentin 300 mg PO TID for pain Continue tramadol 50 mg PO Q12HR PRN pain Continue lidocaine 5% patch Q12HR PRN pain Continue Tamiflu 75 mg PO daily for influenza prophylaxis Continue Protonix 40 mg PO daily for GERD  Patient will participate in the therapeutic group milieu.  Discharge disposition in progress.    Aldean Baker, NP 10/05/2018, 11:46 AM

## 2018-10-05 NOTE — Progress Notes (Signed)
Patient attended grief and loss group facilitated by Burnis Kingfisher, Mdiv, B[facility], and Ethel Rana, MS, [facility], N[facility].   Group focuses on change and loss experienced by group members; topics include loss due to death, loss of relationships, loss of a place, loss of sense of self, etc. Group members are invited to share the changes and losses that are currently affecting their lives. Facilitators tie together shared experiences between group members and validate emotions felt by participants.  Patient Christina [name] attended and participated in group for approximately 1 of the 2 hours. Pt left group 1 hour in. Pt shared about the grief she feels due to losing her mother. She shared that she had a lot of disdain for her mother growing up, but when her mother became ill, pt began loving her more. Pt discussed an absent father from her life and asked if it is possible to grieve something she never had; facilitators validated this grief and the pt's emotions.

## 2018-10-05 NOTE — [facility] Group Notes (Signed)
Adult Psychoeducational Group Note  Date:  10/05/2018 Time:  10:24 PM  Group Topic/Focus:  Wrap-Up Group:   The focus of this group is to help patients review their daily goal of treatment and discuss progress on daily workbooks.  Participation Level:  Active  Participation Quality:  Appropriate and Attentive  Affect:  Appropriate  Cognitive:  Alert and Appropriate  Insight: Appropriate and Good  Engagement in Group:  Engaged  Modes of Intervention:  Discussion and Education  Additional Comments:  Pt attended and participated in wrap up group this evening. Pt felt resilient today. Pt states they feel resilient due to them being through a lot, but they are still pushing forward.   Christina [name] 10/05/2018, 10:24 PM

## 2018-10-06 MED ORDER — HYDROXYZINE HCL 25 MG PO TABS: 25.0000 mg | ORAL_TABLET | Freq: Four times a day (QID) | ORAL | 0 refills | Status: DC | PRN

## 2018-10-06 MED ORDER — NICOTINE 21 MG/24HR TD PT24: 21.0000 mg | MEDICATED_PATCH | Freq: Every day | TRANSDERMAL | 0 refills | Status: DC

## 2018-10-06 MED ORDER — DULOXETINE HCL 60 MG PO CPEP: 60.0000 mg | ORAL_CAPSULE | Freq: Every day | ORAL | 0 refills | Status: DC

## 2018-10-06 MED ORDER — LIDOCAINE 5 % EX PTCH: 1.0000 | MEDICATED_PATCH | Freq: Every day | CUTANEOUS | 0 refills | Status: DC | PRN

## 2018-10-06 MED ORDER — RIVAROXABAN 20 MG PO TABS: 20.0000 mg | ORAL_TABLET | Freq: Every day | ORAL | 0 refills | Status: DC

## 2018-10-06 MED ORDER — GABAPENTIN 300 MG PO CAPS: 300.0000 mg | ORAL_CAPSULE | Freq: Three times a day (TID) | ORAL | 0 refills | Status: DC

## 2018-10-06 MED ORDER — TRAZODONE HCL 50 MG PO TABS: 50.0000 mg | ORAL_TABLET | Freq: Every evening | ORAL | 0 refills | Status: DC | PRN

## 2018-10-06 MED ORDER — TRAMADOL HCL 50 MG PO TABS: 50.0000 mg | ORAL_TABLET | Freq: Two times a day (BID) | ORAL | 0 refills | Status: DC | PRN

## 2018-10-06 MED ORDER — PANTOPRAZOLE SODIUM 40 MG PO TBEC: 40.0000 mg | DELAYED_RELEASE_TABLET | Freq: Every day | ORAL | 0 refills | Status: DC

## 2018-10-06 MED ORDER — PRAZOSIN HCL 1 MG PO CAPS: 1.0000 mg | ORAL_CAPSULE | Freq: Every day | ORAL | 0 refills | Status: DC

## 2018-10-06 MED ORDER — FUROSEMIDE 20 MG PO TABS: 20.0000 mg | ORAL_TABLET | Freq: Every day | ORAL | 0 refills | Status: DC

## 2018-10-06 MED ORDER — ACETAMINOPHEN 325 MG PO TABS: 650.0000 mg | ORAL_TABLET | Freq: Four times a day (QID) | ORAL | 0 refills | Status: DC | PRN

## 2018-10-06 MED ORDER — OSELTAMIVIR PHOSPHATE 75 MG PO CAPS: 75.0000 mg | ORAL_CAPSULE | Freq: Every day | ORAL | 0 refills | Status: DC

## 2018-10-06 MED ORDER — LURASIDONE HCL 20 MG PO TABS: 20.0000 mg | ORAL_TABLET | Freq: Every day | ORAL | 0 refills | Status: DC

## 2018-10-06 NOTE — Progress Notes (Signed)
Discharge note: Patient reviewed discharge paperwork with RN including prescriptions, follow up appointments, and lab work. Patient given the opportunity to ask questions. All concerns were addressed. All belongings were returned to patient. Denied SI/HI/AVH. Patient thanked staff for their care while at the hospital.   

## 2018-10-06 NOTE — Discharge Summary (Signed)
Physician Discharge Summary Note  Patient:  Christina [name] is an 48 y.o., female MRN:  [id number] DOB:  [birth year] Patient phone:  [phone] (home)  Patient address:   [address] [place] [address],  Total Time spent with patient: 15 minutes  Date of Admission:  09/22/2018 Date of Discharge: 10/06/2018  Reason for Admission:  Suicidal ideation  Principal Problem: MDD (major depressive disorder), recurrent episode, severe (HCC) Discharge Diagnoses: Principal Problem:   MDD (major depressive disorder), recurrent episode, severe (HCC) Active Problems:   Chronic bilateral low back pain with bilateral sciatica   Edema, peripheral   Past Psychiatric History: Per admission H&P: Patient had previously been followed as an outpatient at the [facility] behavioral health clinic.  She had been treated most recently with several medications including Depakote, Latuda, gabapentin, Topamax and Risperdal.  She recently went to [facility] to be evaluated to become re-introduced to the outpatient psychiatric system.  Past Medical History:  Past Medical History:  Diagnosis Date  . Anxiety   . Back pain   . 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 INTRAVASCULAR ULTRASOUND/IVUS Left 11/03/2016   Procedure: Intravascular Ultrasound/IVUS;  Surgeon: Maeola Harman, MD;  Location: [facility] INVASIVE CV LAB;  Service: Cardiovascular;  Laterality: Left;  . LOWER EXTREMITY VENOGRAPHY Left 11/04/2016   Procedure: Lower Extremity Venography;  Surgeon: Nada Libman, MD;  Location: [facility] INVASIVE CV LAB;  Service: Cardiovascular;  Laterality: Left;  . PERIPHERAL VASCULAR INTERVENTION Left 11/04/2016   Procedure: Peripheral Vascular Intervention;  Surgeon: Nada Libman, MD;  Location: [facility] INVASIVE CV LAB;  Service: Cardiovascular;  Laterality: Left;  common external  . PERIPHERAL VASCULAR THROMBECTOMY Left 11/03/2016   Procedure:  Peripheral Vascular Thrombectomy;  Surgeon: Maeola Harman, MD;  Location: [facility] INVASIVE CV LAB;  Service: Cardiovascular;  Laterality: Left;   Family History: History reviewed. No pertinent family history. Family Psychiatric  History: Per admission H&P: Noncontributory Social History:  Social History   Substance and Sexual Activity  Alcohol Use No     Social History   Substance and Sexual Activity  Drug Use No    Social History   Socioeconomic History  . Marital status: Divorced    Spouse name: Not on file  . Number of children: Not on file  . Years of education: Not on file  . Highest education level: Not on file  Occupational History  . Not on file  Social Needs  . Financial resource strain: Very hard  . Food insecurity:    Worry: Sometimes true    Inability: Sometimes true  . Transportation needs:    Medical: No    Non-medical: No  Tobacco Use  . Smoking status: Current Every Day Smoker    Packs/day: 0.25    Types: Cigarettes  . Smokeless tobacco: Never Used  Substance and Sexual Activity  . Alcohol use: No  . Drug use: No  . Sexual activity: Yes    Birth control/protection: Other-see comments    Comment: Tubal ligation  Lifestyle  . Physical activity:    Days per week: 2 days    Minutes per session: 10 min  . Stress: Very much  Relationships  . Social connections:    Talks on phone: Three times a week    Gets together: More than three times a week    Attends religious service: Never    Active member of club  or organization: No    Attends meetings of clubs or organizations: Never    Relationship status: Divorced  Other Topics Concern  . Not on file  Social History Narrative  . Not on file    Hospital Course:  Per admission H&P 09/23/2018: Patient is a 48 year old female with a past psychiatric history significant for possible bipolar disorder, major depression and anxiety. She was [facility] as an outpatient  until 4 months ago when she lost her insurance. She had [facility] on the day of admission, and they sent her to the emergency department secondary to suicidal ideation. The patient stated she had not been on medications in a while. She stated that previously she had had medication, and it would initially work, but then would stop. She complained of musculoskeletal pain. She also reported problems with relationships with a boyfriend, and the death of her mother who died 6 months ago. She has previously been treated with lithium, Depakote, Latuda, gabapentin, Topamax, Risperdal. She was admitted to the hospital for evaluation and stabilization.  Ms. Christina [name] was admitted for suicidal ideation. She had been off all medications prior to admission due to lack of insurance. She had long history of depression and trauma history. She was started on Cymbalta, Neurontin for chronic pain, Minipress for nightmares, Seroquel, PRN trazodone and PRN Vistaril. She was restarted on Xarelto due to history of DVT. Lidocaine patches and PRN tramadol were started for chronic back pain. Her boyfriend whom she lived with broke up with her during this hospitalization and moved all of her things to a storage unit. Patient is unemployed and does not have income. Patient experienced increased depression related to breakup and homelessness. Patient also complained of chronic back pain during hospitalization. She had onset of bilateral lower extremity edema that was slightly worse on the right side; she was sent to the ED for lower extremity duplex/doppler which was negative for DVT bilaterally. Seroquel was decreased and then stopped due to peripheral edema, and Latuda was started. Hospitalist was consulted and recommended initiation of Lasix 20 mg PO daily for peripheral edema.   Ms. Christina [name]. She was an active participant in group therapy. She stabilized with medications and  therapy. She was discharged on the medications listed below. She has shown improvement with improved mood, affect, sleep, appetite, and interaction. She denies any SI/HI/AVH and contracts for safety. Patient agrees to follow [facility] well as [facility] for primary care provider for management of edema, DVT prophylaxis, and chronic pain (see below). Patient is provided with prescriptions and medication samples upon discharge. She is picked up by her ex-husband at discharge and will be staying with her ex-boyfriend's niece.  Physical Findings: AIMS: Facial and Oral Movements Muscles of Facial Expression: None, normal Lips and Perioral Area: None, normal Jaw: None, normal Tongue: None, normal,Extremity Movements Upper (arms, wrists, hands, fingers): None, normal Lower (legs, knees, ankles, toes): None, normal, Trunk Movements Neck, shoulders, hips: None, normal, Overall Severity Severity of abnormal movements (highest score from questions above): None, normal Incapacitation due to abnormal movements: None, normal Patient's awareness of abnormal movements (rate only patient's report): No Awareness, Dental Status Current problems with teeth and/or dentures?: No Does patient usually wear dentures?: No  CIWA:  CIWA-Ar Total: 1 COWS:  COWS Total Score: 2  Musculoskeletal: Strength & Muscle Tone: within normal limits Gait & Station: normal Patient leans: N/A  Psychiatric Specialty Exam: Physical Exam  Nursing note and vitals reviewed. Constitutional: She is oriented to person, place, and time. She appears well-developed and well-nourished.  Cardiovascular: Normal rate.  Respiratory: Effort normal.  Neurological: She is alert and oriented to person, place, and time.    Review of Systems  Constitutional: Negative.   Psychiatric/Behavioral: Positive for depression (improving) and substance abuse (UDS +THC). Negative for hallucinations, memory loss and suicidal ideas.  The patient is not nervous/anxious and does not have insomnia.     Blood pressure 118/81, pulse 76, temperature 98 F (36.7 C), temperature source Oral, resp. rate 20, height 5\' 6"  (1.676 m), weight 70.8 kg, last menstrual period 09/22/2018, SpO2 97 %.Body mass index is 25.18 kg/m.  See MD's discharge SRA     Have you used any form of tobacco in the last 30 days? (Cigarettes, Smokeless Tobacco, Cigars, and/or Pipes): Yes  Has this patient used any form of tobacco in the last 30 days? (Cigarettes, Smokeless Tobacco, Cigars, and/or Pipes) Yes, a prescription for an FDA-approved medication for tobacco cessation was offered at discharge.   Blood Alcohol level:  Lab Results  Component Value Date   ETH <10 09/22/2018    Metabolic Disorder Labs:  Lab Results  Component Value Date   HGBA1C 6.0 (H) 09/30/2018   MPG 125.5 09/30/2018   No results found for: PROLACTIN Lab Results  Component Value Date   CHOL 195 09/30/2018   TRIG 105 09/30/2018   HDL 57 09/30/2018   CHOLHDL 3.4 09/30/2018   VLDL 21 09/30/2018   LDLCALC 117 (H) 09/30/2018    See Psychiatric Specialty Exam and Suicide Risk Assessment completed by Attending Physician prior to discharge.  Discharge destination:  Home  Is patient on multiple antipsychotic therapies at discharge:  No   Has Patient had three or more failed trials of antipsychotic monotherapy by history:  No  Recommended Plan for Multiple Antipsychotic Therapies: NA  Discharge Instructions    Discharge instructions   Complete by:  As directed    Patient is instructed to take all prescribed medications as recommended. Report any side effects or adverse reactions to your outpatient psychiatrist. Patient is instructed to abstain from alcohol and illegal drugs while on prescription medications. In the event of worsening symptoms, patient is instructed to call the crisis hotline, 911, or go to the nearest emergency department for evaluation and treatment.      Allergies as of 10/06/2018   No Known Allergies     Medication List    STOP taking these medications   amLODipine 10 MG tablet Commonly known as:  NORVASC   carvedilol 12.5 MG tablet Commonly known as:  COREG   escitalopram 20 MG tablet Commonly known as:  LEXAPRO   HYDROcodone-acetaminophen 5-325 MG tablet Commonly known as:  NORCO   nitrofurantoin (macrocrystal-monohydrate) 100 MG capsule Commonly known as:  MACROBID   QUEtiapine 200 MG tablet Commonly known as:  SEROQUEL   SUMAtriptan 25 MG tablet Commonly known as:  IMITREX   topiramate 25 MG tablet Commonly known as:  TOPAMAX     TAKE these medications     Indication  acetaminophen 325 MG tablet Commonly known as:  TYLENOL Take 2 tablets (650 mg total) by mouth every 6 (six) hours as needed for mild pain. (May buy over the counter) What changed:    medication strength  how much to take  reasons to take this  additional instructions  Another medication with the same name was removed. Continue  taking this medication, and follow the directions you see here.  Indication:  Pain   DULoxetine 60 MG capsule Commonly known as:  CYMBALTA Take 1 capsule (60 mg total) by mouth daily. For mood Start taking on:  October 07, 2018  Indication:  Mood   furosemide 20 MG tablet Commonly known as:  LASIX Take 1 tablet (20 mg total) by mouth daily. For swelling Start taking on:  October 07, 2018  Indication:  Edema   gabapentin 300 MG capsule Commonly known as:  NEURONTIN Take 1 capsule (300 mg total) by mouth 3 (three) times daily. For pain What changed:    how much to take  additional instructions  Indication:  Pain   hydrOXYzine 25 MG tablet Commonly known as:  ATARAX/VISTARIL Take 1 tablet (25 mg total) by mouth every 6 (six) hours as needed for anxiety.  Indication:  Feeling Anxious   ibuprofen 200 MG tablet Commonly known as:  ADVIL,MOTRIN Take 600 mg by mouth daily as needed for moderate  pain.  Indication:  Mild to Moderate Pain   lidocaine 5 % Commonly known as:  LIDODERM Place 1 patch onto the skin daily as needed. Remove & Discard patch within 12 hours or as directed by MD for pain  Indication:  Pain   lurasidone 20 MG Tabs tablet Commonly known as:  LATUDA Take 1 tablet (20 mg total) by mouth daily with breakfast. For mood Start taking on:  October 07, 2018  Indication:  Mood   nicotine 21 mg/24hr patch Commonly known as:  NICODERM CQ - dosed in mg/24 hours Place 1 patch (21 mg total) onto the skin daily. For smoking cessation Start taking on:  October 07, 2018  Indication:  Nicotine Addiction   oseltamivir 75 MG capsule Commonly known as:  TAMIFLU Take 1 capsule (75 mg total) by mouth daily. For flu prevention Start taking on:  October 07, 2018  Indication:  Influenza prophylaxis   pantoprazole 40 MG tablet Commonly known as:  PROTONIX Take 1 tablet (40 mg total) by mouth daily. For acid reflux Start taking on:  October 07, 2018  Indication:  Gastroesophageal Reflux Disease   prazosin 1 MG capsule Commonly known as:  MINIPRESS Take 1 capsule (1 mg total) by mouth at bedtime. For nightmares  Indication:  Frightening Dreams   rivaroxaban 20 MG Tabs tablet Commonly known as:  XARELTO Take 1 tablet (20 mg total) by mouth daily with supper. For DVT prevention What changed:  additional instructions  Indication:  Blood Clot in a Deep Vein   traMADol 50 MG tablet Commonly known as:  ULTRAM Take 1 tablet (50 mg total) by mouth every 12 (twelve) hours as needed for moderate pain.  Indication:  Pain   traZODone 50 MG tablet Commonly known as:  DESYREL Take 1 tablet (50 mg total) by mouth at bedtime as needed for sleep.  Indication:  Trouble Sleeping      Follow-up Information    [facility] Follow up on 10/08/2018.   Why:  Hospital follow up appointment is Friday, 2/14 at 8:00a. Please bring your photo ID, proof of insurance, SSN, current  medications, and discharge paperwork from this hospitalization.  Contact information: [address] [place] [address] [phone]        [facility] Follow up on 10/25/2018.   Why:  Please attend your hospital follow up appointment on Monday, 3/2 at 1:50p. Please bring your current medications and discharge paperwork form this hospitalization.  Contact information: [place] [address] [phone]          Follow-up recommendations: Activity as tolerated. Diet as recommended by primary care physician. Keep all scheduled follow-up appointments as recommended.   Comments:   Patient is instructed to take all prescribed medications as recommended. Report any side effects or adverse reactions to your outpatient psychiatrist. Patient is instructed to abstain from alcohol and illegal drugs while on prescription medications. In the event of worsening symptoms, patient is instructed to call the crisis hotline, 911, or go to the nearest emergency department for evaluation and treatment.  Signed: Aldean Baker, NP 10/06/2018, 2:12 PM

## 2018-10-06 NOTE — [facility] Suicide Risk Assessment (Signed)
[facility] Discharge Suicide Risk Assessment   Principal Problem: MDD (major depressive disorder), recurrent episode, severe (HCC) Discharge Diagnoses: Principal Problem:   MDD (major depressive disorder), recurrent episode, severe (HCC) Active Problems:   Chronic bilateral low back pain with bilateral sciatica   Edema, peripheral   Total Time spent with patient: 15 minutes  Musculoskeletal: Strength & Muscle Tone: within normal limits Gait & Station: normal Patient leans: N/A  Psychiatric Specialty Exam: Review of Systems  All other systems reviewed and are negative.   Blood pressure 118/81, pulse 76, temperature 98 F (36.7 C), temperature source Oral, resp. rate 20, height 5\' 6"  (1.676 m), weight 70.8 kg, last menstrual period 09/22/2018, SpO2 97 %.Body mass index is 25.18 kg/m.  General Appearance: Casual  Eye Contact::  Fair  Speech:  Normal Rate409  Volume:  Normal  Mood:  Euthymic  Affect:  Congruent  Thought Process:  Coherent and Descriptions of Associations: Intact  Orientation:  Full (Time, Place, and Person)  Thought Content:  Logical  Suicidal Thoughts:  No  Homicidal Thoughts:  No  Memory:  Immediate;   Fair Recent;   Fair Remote;   Fair  Judgement:  Intact  Insight:  Fair  Psychomotor Activity:  Normal  Concentration:  Fair  Recall:  [employer] of Knowledge:Fair  Language: Fair  Akathisia:  Negative  Handed:  Right  AIMS (if indicated):     Assets:  Communication Skills Desire for Improvement Physical Health Resilience  Sleep:  Number of Hours: 6.25  Cognition: WNL  ADL's:  Intact   Mental Status Per Nursing Assessment::   On Admission:  Self-harm thoughts  Demographic Factors:  Divorced or widowed, Low socioeconomic status and Unemployed  Loss Factors: Loss of significant relationship  Historical Factors: Impulsivity  Risk Reduction Factors:   Positive social support  Continued Clinical Symptoms:  Depression:   Impulsivity  Cognitive  Features That Contribute To Risk:  None    Suicide Risk:  Minimal: No identifiable suicidal ideation.  Patients presenting with no risk factors but with morbid ruminations; may be classified as minimal risk based on the severity of the depressive symptoms  Follow-up Information    [facility] Follow up on 10/08/2018.   Why:  Hospital follow up appointment is Friday, 2/14 at 8:00a. Please bring your photo ID, proof of insurance, SSN, current medications, and discharge paperwork from this hospitalization.  Contact information: [address] [place] [address] [phone]           Plan Of Care/Follow-up recommendations:  Activity:  ad lib  Antonieta Pert, MD 10/06/2018, 7:48 AM

## 2018-10-06 NOTE — Progress Notes (Signed)
  [facility] Case Management Discharge Plan :  Will you be returning to the same living situation after discharge:  No. Patient reports she is discharging to her ex-husband's niece home.  At discharge, do you have transportation home?: Yes,  patient reports her ex-husband is picking her up at discharge Do you have the ability to pay for your medications: No.  Release of information consent forms completed and in the chart;  Patient's signature needed at discharge.  Patient to Follow up at: Follow-up Information    [facility] Follow up on 10/08/2018.   Why:  Hospital follow up appointment is Friday, 2/14 at 8:00a. Please bring your photo ID, proof of insurance, SSN, current medications, and discharge paperwork from this hospitalization.  Contact information: [address] [place] [address] [phone]        [facility] Follow up on 10/25/2018.   Why:  Please attend your hospital follow up appointment on Monday, 3/2 at 1:50p. Please bring your current medications and discharge paperwork form this hospitalization.  Contact information: [address] [place] [address] [phone]          Next level of care provider has access to [facility] Link:yes  Safety Planning and Suicide Prevention discussed: Yes,  with the patient   Have you used any form of tobacco in the last 30 days? (Cigarettes, Smokeless Tobacco, Cigars, and/or Pipes): Yes  Has patient been referred to the Quitline?: Patient refused referral  Patient has been referred for addiction treatment: N/A  Maeola Sarah, LCSWA 10/06/2018, 10:13 AM

## 2018-10-06 NOTE — Progress Notes (Signed)
Patient ID: Christina [name], female   DOB: [birth year], 48 y.o.   MRN: [id number] D: Patient observed in dayroom playing game with peers. Pt mood and affect appears depressed. Pt reports is possible she might get discharged tomorrow which she is very nervous about because she is not sure what is out there for her. Pt attended evening wrap up group and engaged in discussions. Denies  SI/HI/AVH.No behavioral issues noted.  A: Support and encouragement offered as needed to express needs. Medications administered as prescribed.  R: Patient is safe and cooperative on unit. Will continue to monitor  for safety and stability.

## 2018-10-21 ENCOUNTER — Other Ambulatory Visit: Payer: Self-pay

## 2018-10-21 ENCOUNTER — Inpatient Hospital Stay (HOSPITAL_COMMUNITY)
Admission: RE | Admit: 2018-10-21 | Discharge: 2018-10-28 | DRG: 885 | Disposition: A | Discharge status: Home / Self Care | Payer: Medicaid Other | Attending: Psychiatry | Admitting: Psychiatry

## 2018-10-21 ENCOUNTER — Encounter (HOSPITAL_COMMUNITY): Payer: Self-pay | Admitting: *Deleted

## 2018-10-21 ENCOUNTER — Other Ambulatory Visit: Payer: Self-pay | Admitting: Registered Nurse

## 2018-10-21 DIAGNOSIS — F322 Major depressive disorder, single episode, severe without psychotic features: Secondary | ICD-10-CM | POA: Diagnosis present

## 2018-10-21 DIAGNOSIS — F17210 Nicotine dependence, cigarettes, uncomplicated: Secondary | ICD-10-CM | POA: Diagnosis present

## 2018-10-21 DIAGNOSIS — Z560 Unemployment, unspecified: Secondary | ICD-10-CM | POA: Diagnosis not present

## 2018-10-21 DIAGNOSIS — Z91120 Patient's intentional underdosing of medication regimen due to financial hardship: Secondary | ICD-10-CM | POA: Diagnosis not present

## 2018-10-21 DIAGNOSIS — G8929 Other chronic pain: Secondary | ICD-10-CM | POA: Diagnosis present

## 2018-10-21 DIAGNOSIS — M5442 Lumbago with sciatica, left side: Secondary | ICD-10-CM | POA: Diagnosis present

## 2018-10-21 DIAGNOSIS — R600 Localized edema: Secondary | ICD-10-CM | POA: Diagnosis present

## 2018-10-21 DIAGNOSIS — M5441 Lumbago with sciatica, right side: Secondary | ICD-10-CM | POA: Diagnosis present

## 2018-10-21 DIAGNOSIS — Z86718 Personal history of other venous thrombosis and embolism: Secondary | ICD-10-CM | POA: Diagnosis not present

## 2018-10-21 DIAGNOSIS — T43216A Underdosing of selective serotonin and norepinephrine reuptake inhibitors, initial encounter: Secondary | ICD-10-CM | POA: Diagnosis present

## 2018-10-21 DIAGNOSIS — G4700 Insomnia, unspecified: Secondary | ICD-10-CM | POA: Diagnosis present

## 2018-10-21 DIAGNOSIS — R45851 Suicidal ideations: Secondary | ICD-10-CM | POA: Diagnosis present

## 2018-10-21 MED ORDER — ACETAMINOPHEN 325 MG PO TABS
650.0000 mg | ORAL_TABLET | Freq: Four times a day (QID) | ORAL | Status: DC | PRN
  Administered 2018-10-21 – 2018-10-27 (×2): 650 mg via ORAL
  Filled 2018-10-21 (×2): qty 2

## 2018-10-21 MED ORDER — DULOXETINE HCL 60 MG PO CPEP
60.0000 mg | ORAL_CAPSULE | Freq: Every day | ORAL | Status: DC
  Administered 2018-10-21 – 2018-10-23 (×3): 60 mg via ORAL
  Filled 2018-10-21 (×6): qty 1

## 2018-10-21 MED ORDER — FUROSEMIDE 20 MG PO TABS
20.0000 mg | ORAL_TABLET | Freq: Every day | ORAL | Status: DC
  Administered 2018-10-21 – 2018-10-25 (×4): 20 mg via ORAL
  Filled 2018-10-21 (×7): qty 1

## 2018-10-21 MED ORDER — TRAZODONE HCL 50 MG PO TABS
50.0000 mg | ORAL_TABLET | Freq: Every evening | ORAL | Status: DC | PRN
  Administered 2018-10-21 – 2018-10-24 (×4): 50 mg via ORAL
  Filled 2018-10-21 (×3): qty 1

## 2018-10-21 MED ORDER — TRAMADOL HCL 50 MG PO TABS: 50.0000 mg | ORAL_TABLET | Freq: Four times a day (QID) | ORAL | Status: DC | PRN

## 2018-10-21 MED ORDER — PRAZOSIN HCL 1 MG PO CAPS
1.0000 mg | ORAL_CAPSULE | Freq: Every day | ORAL | Status: DC
  Administered 2018-10-21 – 2018-10-27 (×7): 1 mg via ORAL
  Filled 2018-10-21 (×6): qty 1
  Filled 2018-10-21: qty 7
  Filled 2018-10-21 (×3): qty 1

## 2018-10-21 MED ORDER — PANTOPRAZOLE SODIUM 40 MG PO TBEC
40.0000 mg | DELAYED_RELEASE_TABLET | Freq: Every day | ORAL | Status: DC
  Administered 2018-10-21: 40 mg via ORAL
  Filled 2018-10-21 (×7): qty 1

## 2018-10-21 MED ORDER — NICOTINE 21 MG/24HR TD PT24
21.0000 mg | MEDICATED_PATCH | Freq: Every day | TRANSDERMAL | Status: DC
  Filled 2018-10-21 (×11): qty 1

## 2018-10-21 MED ORDER — HYDROXYZINE HCL 25 MG PO TABS
25.0000 mg | ORAL_TABLET | Freq: Four times a day (QID) | ORAL | Status: DC | PRN
  Administered 2018-10-23 – 2018-10-26 (×4): 25 mg via ORAL
  Filled 2018-10-21: qty 10
  Filled 2018-10-21 (×4): qty 1

## 2018-10-21 MED ORDER — GABAPENTIN 300 MG PO CAPS
300.0000 mg | ORAL_CAPSULE | Freq: Three times a day (TID) | ORAL | Status: DC
  Administered 2018-10-21 – 2018-10-24 (×8): 300 mg via ORAL
  Filled 2018-10-21 (×13): qty 1

## 2018-10-21 MED ORDER — RIVAROXABAN 20 MG PO TABS
20.0000 mg | ORAL_TABLET | Freq: Every day | ORAL | Status: DC
  Administered 2018-10-21 – 2018-10-28 (×8): 20 mg via ORAL
  Filled 2018-10-21 (×5): qty 1
  Filled 2018-10-21: qty 7
  Filled 2018-10-21 (×5): qty 1

## 2018-10-21 MED ORDER — IBUPROFEN 200 MG PO TABS
200.0000 mg | ORAL_TABLET | Freq: Four times a day (QID) | ORAL | Status: DC | PRN
  Administered 2018-10-23 (×2): 200 mg via ORAL
  Filled 2018-10-21 (×2): qty 1
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
## 2018-10-21 NOTE — Progress Notes (Signed)
Nursing Progress Note: 7p-7a D: Pt currently presents with a depressed/sad/flat affect and behavior. Pt states "I may never not want to kill myself." Interacting appropriately with the milieu. Pt reports good sleep during the previous night with current medication regimen. Pt did attend wrap-up group.  A: Pt provided with medications per providers orders. Pt's labs and vitals were monitored throughout the night. Pt supported emotionally and encouraged to express concerns and questions. Pt educated on medications.  R: Pt's safety ensured with 15 minute and environmental checks. Pt currently denies HI and AVH and endorses passive SI. Pt verbally contracts to seek staff if SI,HI, or AVH occurs and to consult with staff before acting on any harmful thoughts. Will continue to monitor.

## 2018-10-21 NOTE — Progress Notes (Signed)
Skin Assessment:   Patient's legs/feet have dry skin.  No skin problems, no tattoos, no scars.

## 2018-10-21 NOTE — H&P (Signed)
Behavioral Health Medical Screening Exam  Christina [name] is an 48 y.o. female presents to [facility] with complaints of suicidal ideation and a plan to walk into traffic.  Patient is unable to contract for safety.  Patient reports she was in psychiatric hospital 2 weeks ago but since discharge she has not been able to get prescriptions for medications filled and a worsening of depression and stress.  Total Time spent with patient: 30 minutes  Psychiatric Specialty Exam: Physical Exam  Vitals reviewed. Constitutional: She is oriented to person, place, and time. She appears well-developed and well-nourished.  Neck: Normal range of motion.  Respiratory: Effort normal.  Musculoskeletal: Normal range of motion.  Neurological: She is alert and oriented to person, place, and time.  Skin: Skin is warm and dry.  Psychiatric: Her speech is normal and behavior is normal. Her mood appears anxious. Thought content is not paranoid and not delusional. Cognition and memory are normal. She expresses impulsivity. She exhibits a depressed mood. She expresses suicidal ideation. She expresses no homicidal ideation. She expresses suicidal plans.    Review of Systems  Psychiatric/Behavioral: Positive for depression. Substance abuse:  "I smoke weed daily"  Suicidal ideas: "I'm just going to walk in front of a car"   The patient is nervous/anxious.   All other systems reviewed and are negative.   Blood pressure (!) 140/94, pulse 70, temperature 97.9 F (36.6 C), resp. rate 16, last menstrual period 09/22/2018, SpO2 100 %.There is no height or weight on file to calculate BMI.  General Appearance: Casual  Eye Contact:  Minimal  Speech:  Clear and Coherent and Normal Rate  Volume:  Normal  Mood:  Anxious, Depressed, Hopeless and Worthless  Affect:  Depressed, Flat and Tearful  Thought Process:  Coherent and Goal Directed  Orientation:  Full (Time, Place, and Person)  Thought Content:  WDL   Suicidal Thoughts:  Yes.  with intent/plan  Homicidal Thoughts:  No  Memory:  Immediate;   Good Recent;   Good Remote;   Good  Judgement:  Impaired  Insight:  Lacking  Psychomotor Activity:  Normal  Concentration: Concentration: Fair and Attention Span: Fair  Recall:  Good  Fund of Knowledge:Fair  Language: Good  Akathisia:  No  Handed:  Right  AIMS (if indicated):     Assets:  Communication Skills Desire for Improvement  Sleep:       Musculoskeletal: Strength & Muscle Tone: within normal limits Gait & Station: normal Patient leans: N/A  Blood pressure (!) 140/94, pulse 70, temperature 97.9 F (36.6 C), resp. rate 16, last menstrual period 09/22/2018, SpO2 100 %.  Recommendations:  Inpatient psychiatric treatment  Based on my evaluation the patient does not appear to have an emergency medical condition.  Suhaan Perleberg, NP 10/21/2018, 2:39 PM

## 2018-10-21 NOTE — Tx Team (Signed)
Initial Treatment Plan 10/21/2018 5:04 PM Kassandre Wattley VBT:[id number]    PATIENT STRESSORS: Financial difficulties Medication change or noncompliance   PATIENT STRENGTHS: Ability for insight Average or above average intelligence Capable of independent living Communication skills General fund of knowledge Motivation for treatment/growth   PATIENT IDENTIFIED PROBLEMS: Depression Suicidal thoughts "I'm feeling suicidal" "Get back on my medicine while I'm here"                     DISCHARGE CRITERIA:  Ability to meet basic life and health needs Improved stabilization in mood, thinking, and/or behavior Reduction of life-threatening or endangering symptoms to within safe limits Verbal commitment to aftercare and medication compliance  PRELIMINARY DISCHARGE PLAN: Attend aftercare/continuing care group  PATIENT/FAMILY INVOLVEMENT: This treatment plan has been presented to and reviewed with the patient, [name], and/or family member, .  The patient and family have been given the opportunity to ask questions and make suggestions.  Sheala Dosh, [place], [place] 10/21/2018, 5:04 PM

## 2018-10-21 NOTE — [facility] Group Notes (Addendum)
[facility] Group Notes:  Nursing Psychoeducation  Date:  10/21/2018  Time:  4:00 PM  Type of Therapy:  Psychoeducational Skills   Group Topic: Anxiety: Triggers & Coping Skills Patients are asked to identify an upcoming event that makes them anxious. Patient are then asked to share the worst thing that could happen, the best thing that could happen, and steps they can take to prevent the worst from happening as well as ways to cope.  Participation Level:  None  Participation Quality:  Resistant  Affect:  Flat, Defensive and Irritable  Cognitive:  Alert and Oriented  Insight:  Limited  Engagement in Group:  Defensive and Resistant  Modes of Intervention:  Discussion, Socialization and Support  Summary of Progress/Problems: Patient was asked to share but became irritable and stated, "I'm pissed off, so no, I'm not sharing". Patient declined to discuss this further. Emotional support provided.  Christina [name] A Christina [name] 10/21/2018, 5:00 PM

## 2018-10-21 NOTE — Progress Notes (Signed)
Christina [name] is a 48 year old female pt admitted on voluntary basis after presenting as a walk-in. On admission, she presents as gruff and irritable. She reports that she has been feeling depressed and suicidal which is why she came back to [facility]. She is able to contract for safety while in the hospital. She reports that she has not had any of her medications in the past couple days and reports that she has financial issues to where she cannot afford to get them. She reports that she does use marijuana but denies any other substance use. She reports that she is currently homeless and when asked about where she can go after discharge she reports "I will figure it out". Christina [name] was escorted to the unit, oriented to the milieu and safety maintained.

## 2018-10-21 NOTE — Progress Notes (Signed)
Adult Psychoeducational Group Note  Date:  10/21/2018 Time:  9:20 PM  Group Topic/Focus:  Wrap-Up Group:   The focus of this group is to help patients review their daily goal of treatment and discuss progress on daily workbooks.  Participation Level:  Active  Participation Quality:  Appropriate  Affect:  Appropriate  Cognitive:  Alert  Insight: Appropriate  Engagement in Group:  Engaged  Modes of Intervention:  Discussion  Additional Comments:  Patient stated having a horrible day because she's here. Patient states that her goal is to get emotionally and mentally stable. Patient also stated that she wants to feel normal without being on medication.   Lilibeth Opie L Loreta Blouch 10/21/2018, 9:20 PM

## 2018-10-22 DIAGNOSIS — F322 Major depressive disorder, single episode, severe without psychotic features: Principal | ICD-10-CM

## 2018-10-22 LAB — GLUCOSE, CAPILLARY: Glucose-Capillary: 99 mg/dL (ref 70–99)

## 2018-10-22 NOTE — Plan of Care (Signed)
  Problem: Education: Goal: Verbalization of understanding the information provided will improve Outcome: Progressing   Problem: Activity: Goal: Interest or engagement in activities will improve Outcome: Progressing   Problem: Education: Goal: Emotional status will improve Outcome: Not Progressing Goal: Mental status will improve Outcome: Not Progressing   

## 2018-10-22 NOTE — Progress Notes (Signed)
Recreation Therapy Notes  Date:  2.28.20 Time: 0930 Location: [address] Dayroom  Group Topic: Stress Management  Goal Area(s) Addresses:  Patient will identify positive stress management techniques. Patient will identify benefits of using stress management post d/c.  Intervention: Stress Management  Activity :  Progressive Muscle Relaxation.  LRT introduced the stress management technique of stress management.  LRT read a script that focused on tensing and relaxing each muscle group individually.  Patients were to follow along as the script was read to engage in activity.  Education:  Stress Management, Discharge Planning.   Education Outcome: Acknowledges Education  Clinical Observations/Feedback:  Pt did not attend group.    Caroll Rancher, LRT/CTRS        Caroll Rancher A 10/22/2018 10:54 AM

## 2018-10-22 NOTE — Progress Notes (Signed)
Patient ID: Christina [name], female   DOB: 08-Jul-1971, 48 y.o.   MRN: [id number] D: Patient observed in dayroom quietly watching TV and not interacting well with peers. Pt presented with depressed mood and flat affect. Pt reports she is embarrassed about returning so quickly after discharge. Pt attened evening wrap up group and engaged in discussions. Denies  SI/HI/AVH and pain.No behavioral issues noted.  A: Support and encouragement offered as needed to express needs. Medications administered as prescribed.  R: Patient is safe and cooperative on unit. Will continue to monitor  for safety and stability.

## 2018-10-22 NOTE — [facility] Group Notes (Signed)
Adult Psychoeducational Group Note  Date:  10/22/2018 Time:  8:40 PM  Group Topic/Focus:  Wrap-Up Group:   The focus of this group is to help patients review their daily goal of treatment and discuss progress on daily workbooks.  Participation Level:  Active  Participation Quality:  Appropriate and Attentive  Affect:  Appropriate  Cognitive:  Alert and Appropriate  Insight: Appropriate and Good  Engagement in Group:  Engaged  Modes of Intervention:  Discussion and Education  Additional Comments:  Pt attended and participated in wrap up group this evening. Pt rated their day a 6/10, due to them enjoying music therapy, but they have been feel nauseous today. Pt goal is to feel better so that they do not have to come back here.   Christina [name] 10/22/2018, 8:40 PM

## 2018-10-22 NOTE — B[facility] Group Notes (Addendum)
[facility] LCSW Group Therapy Note  Date/Time 10/22/2018 4:07 PM  Type of Therapy/Topic:  Group Therapy:  Feelings about Diagnosis  Participation Level:  Active   Mood: Thoughtful  Description of Group:    This group will allow patients to explore their thoughts and feelings about diagnoses they have received. Patients will be guided to explore their level of understanding and acceptance of these diagnoses. Facilitator will encourage patients to process their thoughts and feelings about the reactions of others to their diagnosis, and will guide patients in identifying ways to discuss their diagnosis with significant others in their lives. This group will be process-oriented, with patients participating in exploration of their own experiences as well as giving and receiving support and challenge from other group members.   Therapeutic Goals: 1. Patient will demonstrate understanding of diagnosis as evidence by identifying two or more symptoms of the disorder:  2. Patient will be able to express two feelings regarding the diagnosis 3. Patient will demonstrate ability to communicate their needs through discussion and/or role plays  Summary of Patient Progress: Leba Gribbon attended the entire session. She felt different from other people when she was younger and isolated herself from others. She shared that she knows herself better now that she understands her diagnosis. Brayleigh also reported that she has a nonjudgmental support person in her life.    Therapeutic Modalities:   Cognitive Behavioral Therapy Brief Therapy Feelings Identification   Marian Sorrow, MSW Intern 10/22/2018 4:05 PM
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
## 2018-10-22 NOTE — Tx Team (Signed)
Interdisciplinary Treatment and Diagnostic Plan Update  10/22/2018 Time of Session:  Christina [name] MRN: [id number]  Principal Diagnosis: <principal problem not specified>  Secondary Diagnoses: Active Problems:   MDD (major depressive disorder), single episode, severe , no psychosis (HCC)   Current Medications:  Current Facility-Administered Medications  Medication Dose Route Frequency Provider Last Rate Last Dose  . acetaminophen (TYLENOL) tablet 650 mg  650 mg Oral Q6H PRN Clary, Greg Lawson, MD   650 mg at 10/21/18 1711  . DULoxetine (CYMBALTA) DR capsule 60 mg  60 mg Oral Daily Clary, Greg Lawson, MD   60 mg at 10/22/18 0823  . furosemide (LASIX) tablet 20 mg  20 mg Oral Daily Clary, Greg Lawson, MD   20 mg at 10/22/18 0823  . gabapentin (NEURONTIN) capsule 300 mg  300 mg Oral TID Clary, Greg Lawson, MD   300 mg at 10/22/18 0823  . hydrOXYzine (ATARAX/VISTARIL) tablet 25 mg  25 mg Oral Q6H PRN Clary, Greg Lawson, MD      . ibuprofen (ADVIL,MOTRIN) tablet 200 mg  200 mg Oral Q6H PRN Clary, Greg Lawson, MD      . nicotine (NICODERM CQ - dosed in mg/24 hours) patch 21 mg  21 mg Transdermal Daily Clary, Greg Lawson, MD      . pantoprazole (PROTONIX) EC tablet 40 mg  40 mg Oral Daily Clary, Greg Lawson, MD   40 mg at 10/21/18 1711  . prazosin (MINIPRESS) capsule 1 mg  1 mg Oral QHS Clary, Greg Lawson, MD   1 mg at 10/21/18 2205  . rivaroxaban (XARELTO) tablet 20 mg  20 mg Oral Daily Clary, Greg Lawson, MD   20 mg at 10/22/18 0823  . traZODone (DESYREL) tablet 50 mg  50 mg Oral QHS PRN Clary, Greg Lawson, MD   50 mg at 10/21/18 2205   PTA Medications: Medications Prior to Admission  Medication Sig Dispense Refill Last Dose  . acetaminophen (TYLENOL) 325 MG tablet Take 2 tablets (650 mg total) by mouth every 6 (six) hours as needed for mild pain. (May buy over the counter) 1 tablet 0   . DULoxetine (CYMBALTA) 60 MG capsule Take 1 capsule (60 mg total) by mouth daily. For mood 30 capsule 0    . furosemide (LASIX) 20 MG tablet Take 1 tablet (20 mg total) by mouth daily. For swelling 15 tablet 0   . gabapentin (NEURONTIN) 300 MG capsule Take 1 capsule (300 mg total) by mouth 3 (three) times daily. For pain 90 capsule 0   . hydrOXYzine (ATARAX/VISTARIL) 25 MG tablet Take 1 tablet (25 mg total) by mouth every 6 (six) hours as needed for anxiety. 60 tablet 0   . ibuprofen (ADVIL,MOTRIN) 200 MG tablet Take 600 mg by mouth daily as needed for moderate pain.   Past Week at Unknown time  . lidocaine (LIDODERM) 5 % Place 1 patch onto the skin daily as needed. Remove & Discard patch within 12 hours or as directed by MD for pain 30 patch 0   . lurasidone (LATUDA) 20 MG TABS tablet Take 1 tablet (20 mg total) by mouth daily with breakfast. For mood 30 tablet 0   . nicotine (NICODERM CQ - DOSED IN MG/24 HOURS) 21 mg/24hr patch Place 1 patch (21 mg total) onto the skin daily. For smoking cessation 28 patch 0   . oseltamivir (TAMIFLU) 75 MG capsule Take 1 capsule (75 mg total) by mouth daily. For flu prevention 4 capsule 0   . pantoprazole (PROTONIX)   40 MG tablet Take 1 tablet (40 mg total) by mouth daily. For acid reflux 30 tablet 0   . prazosin (MINIPRESS) 1 MG capsule Take 1 capsule (1 mg total) by mouth at bedtime. For nightmares 30 capsule 0   . rivaroxaban (XARELTO) 20 MG TABS tablet Take 1 tablet (20 mg total) by mouth daily with supper. For DVT prevention 20 tablet 0   . traMADol (ULTRAM) 50 MG tablet Take 1 tablet (50 mg total) by mouth every 12 (twelve) hours as needed for moderate pain. 15 tablet 0   . traZODone (DESYREL) 50 MG tablet Take 1 tablet (50 mg total) by mouth at bedtime as needed for sleep. 30 tablet 0     Patient Stressors: Financial difficulties Medication change or noncompliance  Patient Strengths: Ability for insight Average or above average intelligence Capable of independent living Communication skills General fund of knowledge Motivation for  treatment/growth  Treatment Modalities: Medication Management, Group therapy, Case management,  1 to 1 session with clinician, Psychoeducation, Recreational therapy.   Physician Treatment Plan for Primary Diagnosis: <principal problem not specified> Long Term Goal(s):     Short Term Goals:    Medication Management: Evaluate patient's response, side effects, and tolerance of medication regimen.  Therapeutic Interventions: 1 to 1 sessions, Unit Group sessions and Medication administration.  Evaluation of Outcomes: Not Met  Physician Treatment Plan for Secondary Diagnosis: Active Problems:   MDD (major depressive disorder), single episode, severe , no psychosis (HCC)  Long Term Goal(s):     Short Term Goals:       Medication Management: Evaluate patient's response, side effects, and tolerance of medication regimen.  Therapeutic Interventions: 1 to 1 sessions, Unit Group sessions and Medication administration.  Evaluation of Outcomes: Not Met   RN Treatment Plan for Primary Diagnosis: <principal problem not specified> Long Term Goal(s): Knowledge of disease and therapeutic regimen to maintain health will improve  Short Term Goals: Ability to participate in decision making will improve, Ability to verbalize feelings will improve, Ability to disclose and discuss suicidal ideas, Ability to identify and develop effective coping behaviors will improve and Compliance with prescribed medications will improve  Medication Management: RN will administer medications as ordered by provider, will assess and evaluate patient's response and provide education to patient for prescribed medication. RN will report any adverse and/or side effects to prescribing provider.  Therapeutic Interventions: 1 on 1 counseling sessions, Psychoeducation, Medication administration, Evaluate responses to treatment, Monitor vital signs and CBGs as ordered, Perform/monitor CIWA, COWS, AIMS and Fall Risk screenings as  ordered, Perform wound care treatments as ordered.  Evaluation of Outcomes: Not Met   LCSW Treatment Plan for Primary Diagnosis: <principal problem not specified> Long Term Goal(s): Safe transition to appropriate next level of care at discharge, Engage patient in therapeutic group addressing interpersonal concerns.  Short Term Goals: Engage patient in aftercare planning with referrals and resources  Therapeutic Interventions: Assess for all discharge needs, 1 to 1 time with Social worker, Explore available resources and support systems, Assess for adequacy in community support network, Educate family and significant other(s) on suicide prevention, Complete Psychosocial Assessment, Interpersonal group therapy.  Evaluation of Outcomes: Not Met   Progress in Treatment: Attending groups: No. New to unit  Participating in groups: No. Taking medication as prescribed: No. Toleration medication: Yes. Family/Significant other contact made: No, will contact:  patient declined consent for collateral contacts Patient understands diagnosis: Yes. Discussing patient identified problems/goals with staff: Yes. Medical problems stabilized or resolved: Yes.   Denies suicidal/homicidal ideation: No. Passive SI Issues/concerns per patient self-inventory: No. Other:   New problem(s) identified: None   New Short Term/Long Term Goal(s): medication stabilization, elimination of SI thoughts, development of comprehensive mental wellness plan.   Patient Goals:  Get back on my medicine while I'm here  Discharge Plan or Barriers: Patient reports she is currently homeless with no income and no supports. Patient recently discharged from [facility] two weeks ago with follow up recommendations at [facility] for outpatient medication management and therapy services. CSW will continue to assess and follow for appropriate referrals.   Reason for Continuation of Hospitalization: Depression Medication stabilization Suicidal  ideation  Estimated Length of Stay: 10/25/2018  Attendees: Patient: 10/22/2018 10:41 AM  Physician: Dr. Greg Clary, MD 10/22/2018 10:41 AM  Nursing: Alyssa.J, RN 10/22/2018 10:41 AM  RN Care Manager: 10/22/2018 10:41 AM  Social Worker:  , LCSWA 10/22/2018 10:41 AM  Recreational Therapist:  10/22/2018 10:41 AM  Other: Janet Sykes, NP  10/22/2018 10:41 AM  Other:  10/22/2018 10:41 AM  Other: 10/22/2018 10:41 AM    Scribe for Treatment Team:  E , LCSWA 10/22/2018 10:41 AM 
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
## 2018-10-22 NOTE — H&P (Signed)
Psychiatric Admission Assessment Adult  Patient Identification: Christina [name] MRN:  [id number] Date of Evaluation:  10/22/2018 Chief Complaint:  MDD Principal Diagnosis: <principal problem not specified> Diagnosis:  Active Problems:   MDD (major depressive disorder), single episode, severe , no psychosis (HCC)  History of Present Illness: Patient is seen and examined.  Patient is a 48 year old female with a recent psychiatric hospitalization at our facility from 1/29 to 10/06/2018 for major depression and posttraumatic stress disorder.  She also has a past medical history significant for thrombus issues as well as chronic bilateral low back pain with sciatica.  The patient stated that after she was discharged in the hospital she stated a hotel for approximately 2 weeks.  She then went to live with her sister.  She stated that the environment she was in with her sister was not good because of drug and alcohol use.  She stated she was unable to afford her medications, and started taking her antidepressant and other medications on an every other day basis.  She stated her depression began to worsen at that time.  She stated she had followed up with a local mental health center this previous Wednesday, but it is unclear whether or not she told the staff there that she was only taking her medicines every other day, or whether or not they attempted to help her with this regard.  She presented directly to the behavioral health hospital on 10/21/2018 complaining of worsening depression and suicidal ideation.  She endorsed depressive symptoms of hopelessness, worthlessness, anhedonia, irritability, guilt, tearfulness and insomnia.  She stated also at that time that she had been sleeping on the streets instead of going back to the environment she was living in due to the drug use and violence.  She was admitted to the hospital for evaluation and stabilization.  Associated Signs/Symptoms: Depression Symptoms:   depressed mood, anhedonia, insomnia, psychomotor agitation, fatigue, feelings of worthlessness/guilt, difficulty concentrating, hopelessness, suicidal thoughts without plan, anxiety, loss of energy/fatigue, disturbed sleep, (Hypo) Manic Symptoms:  Impulsivity, Irritable Mood, Anxiety Symptoms:  Excessive Worry, Psychotic Symptoms:  Denied PTSD Symptoms: Had a traumatic exposure:  In the past. Total Time spent with patient: 30 minutes  Past Psychiatric History: Patient had a previous psychiatric admission here approximately 2 weeks ago.  She had been followed as an outpatient at the [facility] behavioral health clinic before she lost her insurance.  She has been treated in the past with several medications including Depakote, Latuda, gabapentin, Topamax, Risperdal, Cymbalta, gabapentin, hydroxyzine, prazosin and trazodone.  Is the patient at risk to self? Yes.    Has the patient been a risk to self in the past 6 months? Yes.    Has the patient been a risk to self within the distant past? Yes.    Is the patient a risk to others? No.  Has the patient been a risk to others in the past 6 months? No.  Has the patient been a risk to others within the distant past? No.   Prior Inpatient Therapy: Prior Inpatient Therapy: Yes Prior Therapy Dates: 2020 Prior Therapy Facilty/Provider(s): [facility] Reason for Treatment: depression, SI Prior Outpatient Therapy: Prior Outpatient Therapy: Yes Prior Therapy Dates: ongoing Prior Therapy Facilty/Provider(s): [facility] Reason for Treatment: depression, PTSD Does patient have an ACCT team?: No Does patient have Intensive In-House Services?  : No Does patient have [facility] services? : Yes Does patient have P4CC services?: No  Alcohol Screening: 1. How often do you have a drink containing alcohol?:  2 to 4 times a month 2. How many drinks containing alcohol do you have on a typical day when you are drinking?: 1 or 2 3. How often do you have six or  more drinks on one occasion?: Less than monthly AUDIT-C Score: 3 4. How often during the last year have you found that you were not able to stop drinking once you had started?: Never 5. How often during the last year have you failed to do what was normally expected from you becasue of drinking?: Never 6. How often during the last year have you needed a first drink in the morning to get yourself going after a heavy drinking session?: Never 7. How often during the last year have you had a feeling of guilt of remorse after drinking?: Never 8. How often during the last year have you been unable to remember what happened the night before because you had been drinking?: Never 9. Have you or someone else been injured as a result of your drinking?: No 10. Has a relative or friend or a doctor or another health worker been concerned about your drinking or suggested you cut down?: No Alcohol Use Disorder Identification Test Final Score (AUDIT): 3 Alcohol Brief Interventions/Follow-up: AUDIT Score <7 follow-up not indicated Substance Abuse History in the last 12 months:  No. Consequences of Substance Abuse: Negative Previous Psychotropic Medications: Yes  Psychological Evaluations: Yes  Past Medical History:  Past Medical History:  Diagnosis Date  . Anxiety   . Back pain   . 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 INTRAVASCULAR ULTRASOUND/IVUS Left 11/03/2016   Procedure: Intravascular Ultrasound/IVUS;  Surgeon: Maeola Harman, MD;  Location: [facility] INVASIVE CV LAB;  Service: Cardiovascular;  Laterality: Left;  . LOWER EXTREMITY VENOGRAPHY Left 11/04/2016   Procedure: Lower Extremity Venography;  Surgeon: Nada Libman, MD;  Location: [facility] INVASIVE CV LAB;  Service: Cardiovascular;  Laterality: Left;  . PERIPHERAL VASCULAR INTERVENTION Left 11/04/2016   Procedure: Peripheral Vascular Intervention;  Surgeon: Nada Libman, MD;  Location[facility] INVASIVE CV LAB;  Service: Cardiovascular;  Laterality: Left;  common external  . PERIPHERAL VASCULAR THROMBECTOMY Left 11/03/2016   Procedure: Peripheral Vascular Thrombectomy;  Surgeon: Maeola Harman, MD;  Location: [facility] INVASIVE CV LAB;  Service: Cardiovascular;  Laterality: Left;   Family History: History reviewed. No pertinent family history. Family Psychiatric  History: History reviewed.  No pertinent family history. Tobacco Screening: Have you used any form of tobacco in the last 30 days? (Cigarettes, Smokeless Tobacco, Cigars, and/or Pipes): Yes Tobacco use, Select all that apply: 5 or more cigarettes per day Are you interested in Tobacco Cessation Medications?: No, patient refused Counseled patient on smoking cessation including recognizing danger situations, developing coping skills and basic information about quitting provided: Refused/Declined practical counseling Social History:  Social History   Substance and Sexual Activity  Alcohol Use Yes     Social History   Substance and Sexual Activity  Drug Use Yes  . Types: Marijuana    Additional Social History: Marital status: Divorced    Pain Medications: see MAR Prescriptions: see MAR Over the Counter: see MAR History of alcohol / drug use?: Yes Name of Substance 1: THC 1 - Age of First Use: UTA 1 - Amount (size/oz): varies 1 - Frequency: "whenever I can get it" 1 - Duration: "years" 1 - Last Use / Amount: 10/20/2018  Allergies:  No Known Allergies Lab Results:  Results for orders placed or performed during the hospital encounter of 10/21/18 (from the past 48 hour(s))  Glucose, capillary     Status: None   Collection Time: 10/22/18 11:48 AM  Result Value Ref Range   Glucose-Capillary 99 70 - 99 mg/dL   Comment 1 Notify RN    Comment 2 Document in Chart     Blood Alcohol level:  Lab Results  Component Value Date   ETH <10 09/22/2018    Metabolic  Disorder Labs:  Lab Results  Component Value Date   HGBA1C 6.0 (H) 09/30/2018   MPG 125.5 09/30/2018   No results found for: PROLACTIN Lab Results  Component Value Date   CHOL 195 09/30/2018   TRIG 105 09/30/2018   HDL 57 09/30/2018   CHOLHDL 3.4 09/30/2018   VLDL 21 09/30/2018   LDLCALC 117 (H) 09/30/2018    Current Medications: Current Facility-Administered Medications  Medication Dose Route Frequency Provider Last Rate Last Dose  . acetaminophen (TYLENOL) tablet 650 mg  650 mg Oral Q6H PRN Antonieta Pert, MD   650 mg at 10/21/18 1711  . DULoxetine (CYMBALTA) DR capsule 60 mg  60 mg Oral Daily Antonieta Pert, MD   60 mg at 10/22/18 1610  . furosemide (LASIX) tablet 20 mg  20 mg Oral Daily Antonieta Pert, MD   20 mg at 10/22/18 9604  . gabapentin (NEURONTIN) capsule 300 mg  300 mg Oral TID Antonieta Pert, MD   300 mg at 10/22/18 5409  . hydrOXYzine (ATARAX/VISTARIL) tablet 25 mg  25 mg Oral Q6H PRN Antonieta Pert, MD      . ibuprofen (ADVIL,MOTRIN) tablet 200 mg  200 mg Oral Q6H PRN Antonieta Pert, MD      . nicotine (NICODERM CQ - dosed in mg/24 hours) patch 21 mg  21 mg Transdermal Daily Antonieta Pert, MD      . pantoprazole (PROTONIX) EC tablet 40 mg  40 mg Oral Daily Antonieta Pert, MD   40 mg at 10/21/18 1711  . prazosin (MINIPRESS) capsule 1 mg  1 mg Oral QHS Antonieta Pert, MD   1 mg at 10/21/18 2205  . rivaroxaban (XARELTO) tablet 20 mg  20 mg Oral Daily Antonieta Pert, MD   20 mg at 10/22/18 8119  . traZODone (DESYREL) tablet 50 mg  50 mg Oral QHS PRN Antonieta Pert, MD   50 mg at 10/21/18 2205   PTA Medications: Medications Prior to Admission  Medication Sig Dispense Refill Last Dose  . acetaminophen (TYLENOL) 325 MG tablet Take 2 tablets (650 mg total) by mouth every 6 (six) hours as needed for mild pain. (May buy over the counter) 1 tablet 0   . DULoxetine (CYMBALTA) 60 MG capsule Take 1 capsule (60 mg total) by mouth  daily. For mood 30 capsule 0   . furosemide (LASIX) 20 MG tablet Take 1 tablet (20 mg total) by mouth daily. For swelling 15 tablet 0   . gabapentin (NEURONTIN) 300 MG capsule Take 1 capsule (300 mg total) by mouth 3 (three) times daily. For pain 90 capsule 0   . hydrOXYzine (ATARAX/VISTARIL) 25 MG tablet Take 1 tablet (25 mg total) by mouth every 6 (six) hours as needed for anxiety. 60 tablet 0   . ibuprofen (ADVIL,MOTRIN) 200 MG tablet Take 600 mg by mouth daily as needed for moderate pain.   Past Week at Unknown time  .  lidocaine (LIDODERM) 5 % Place 1 patch onto the skin daily as needed. Remove & Discard patch within 12 hours or as directed by MD for pain 30 patch 0   . lurasidone (LATUDA) 20 MG TABS tablet Take 1 tablet (20 mg total) by mouth daily with breakfast. For mood 30 tablet 0   . nicotine (NICODERM CQ - DOSED IN MG/24 HOURS) 21 mg/24hr patch Place 1 patch (21 mg total) onto the skin daily. For smoking cessation 28 patch 0   . oseltamivir (TAMIFLU) 75 MG capsule Take 1 capsule (75 mg total) by mouth daily. For flu prevention 4 capsule 0   . pantoprazole (PROTONIX) 40 MG tablet Take 1 tablet (40 mg total) by mouth daily. For acid reflux 30 tablet 0   . prazosin (MINIPRESS) 1 MG capsule Take 1 capsule (1 mg total) by mouth at bedtime. For nightmares 30 capsule 0   . rivaroxaban (XARELTO) 20 MG TABS tablet Take 1 tablet (20 mg total) by mouth daily with supper. For DVT prevention 20 tablet 0   . traMADol (ULTRAM) 50 MG tablet Take 1 tablet (50 mg total) by mouth every 12 (twelve) hours as needed for moderate pain. 15 tablet 0   . traZODone (DESYREL) 50 MG tablet Take 1 tablet (50 mg total) by mouth at bedtime as needed for sleep. 30 tablet 0     Musculoskeletal: Strength & Muscle Tone: within normal limits Gait & Station: normal Patient leans: N/A  Psychiatric Specialty Exam: Physical Exam  Nursing note and vitals reviewed. Constitutional: She is oriented to person, place, and time.  She appears well-developed and well-nourished.  HENT:  Head: Normocephalic and atraumatic.  Respiratory: Effort normal.  Neurological: She is alert and oriented to person, place, and time.    ROS  Blood pressure 124/82, pulse (!) 102, temperature 98.7 F (37.1 C), temperature source Oral, resp. rate 18, height  (1.676 m), weight 71 kg, last menstrual period 09/22/2018, SpO2 100 %.Body mass index is 25.26 kg/m.  General Appearance: Casual  Eye Contact:  Fair  Speech:  Normal Rate  Volume:  Decreased  Mood:  Anxious and Depressed  Affect:  Congruent  Thought Process:  Coherent and Descriptions of Associations: Intact  Orientation:  Full (Time, Place, and Person)  Thought Content:  Logical  Suicidal Thoughts:  Yes.  without intent/plan  Homicidal Thoughts:  No  Memory:  Immediate;   Fair Recent;   Fair Remote;   Fair  Judgement:  Impaired  Insight:  Lacking  Psychomotor Activity:  Increased  Concentration:  Concentration: Fair and Attention Span: Fair  Recall:  [employer] of Knowledge:  Fair  Language:  Fair  Akathisia:  Negative  Handed:  Right  AIMS (if indicated):     Assets:  Desire for Improvement Physical Health Resilience  ADL's:  Intact  Cognition:  WNL  Sleep:  Number of Hours: 6.75    Treatment Plan Summary: Daily contact with patient to assess and evaluate symptoms and progress in treatment, Medication management and Plan : Patient is seen and examined.  Patient is a 48 year old female with a recent psychiatric hospitalization at our facility from 1/29 to 10/06/2018 for major depression and posttraumatic stress disorder.  She also has a past medical history significant for thrombus issues as well as chronic bilateral low back pain with sciatica.  The patient stated that after she was discharged in the hospital she stated a hotel for approximately 2 weeks.  She then went to live  with her sister.  She stated that the environment she was in with her sister was not  good because of drug and alcohol use.  She stated she was unable to afford her medications, and started taking her antidepressant and other medications on an every other day basis.  She stated her depression began to worsen at that time.  She stated she had followed up with a local mental health center this previous Wednesday, but it is unclear whether or not she told the staff there that she was only taking her medicines every other day, or whether or not they attempted to help her with this regard.  She presented directly to the behavioral health hospital on 10/21/2018 complaining of worsening depression and suicidal ideation.  She endorsed depressive symptoms of hopelessness, worthlessness, anhedonia, irritability, guilt, tearfulness and insomnia.  She stated also at that time that she had been sleeping on the streets instead of going back to the environment she was living in due to the drug use and violence.  She was admitted to the hospital for evaluation and stabilization.  Observation Level/Precautions:  15 minute checks  Laboratory:  Chemistry Profile  Psychotherapy:    Medications:    Consultations:    Discharge Concerns:    Estimated LOS:  Other:     Physician Treatment Plan for Primary Diagnosis: <principal problem not specified> Long Term Goal(s): Improvement in symptoms so as ready for discharge  Short Term Goals: Ability to identify changes in lifestyle to reduce recurrence of condition will improve, Ability to verbalize feelings will improve, Ability to disclose and discuss suicidal ideas, Ability to demonstrate self-control will improve, Ability to identify and develop effective coping behaviors will improve, Ability to maintain clinical measurements within normal limits will improve and Compliance with prescribed medications will improve  Physician Treatment Plan for Secondary Diagnosis: Active Problems:   MDD (major depressive disorder), single episode, severe , no psychosis  (HCC)  Long Term Goal(s): Improvement in symptoms so as ready for discharge  Short Term Goals: Ability to identify changes in lifestyle to reduce recurrence of condition will improve, Ability to verbalize feelings will improve, Ability to disclose and discuss suicidal ideas, Ability to demonstrate self-control will improve, Ability to identify and develop effective coping behaviors will improve, Ability to maintain clinical measurements within normal limits will improve and Compliance with prescribed medications will improve  I certify that inpatient services furnished can reasonably be expected to improve the patient's condition.    Antonieta Pert, MD 2/28/202011:50 AM

## 2018-10-23 DIAGNOSIS — F322 Major depressive disorder, single episode, severe without psychotic features: Principal | ICD-10-CM

## 2018-10-23 LAB — RAPID URINE DRUG SCREEN, HOSP PERFORMED
Amphetamines: NOT DETECTED
Barbiturates: NOT DETECTED
Benzodiazepines: POSITIVE — AB
Cocaine: NOT DETECTED
Opiates: NOT DETECTED
TETRAHYDROCANNABINOL: POSITIVE — AB

## 2018-10-23 LAB — GLUCOSE, CAPILLARY: Glucose-Capillary: 116 mg/dL — ABNORMAL HIGH (ref 70–99)

## 2018-10-23 MED ORDER — DULOXETINE HCL 60 MG PO CPEP
80.0000 mg | ORAL_CAPSULE | Freq: Every day | ORAL | Status: DC
  Administered 2018-10-24: 80 mg via ORAL
  Filled 2018-10-23 (×2): qty 1

## 2018-10-23 NOTE — [facility] Group Notes (Signed)
LCSW Group Therapy Note  10/23/2018   10:00-11:00am   Type of Therapy and Topic:  Group Therapy: Anger Cues and Responses  Participation Level:  Active   Description of Group:   In this group, patients learned how to recognize the physical, cognitive, emotional, and behavioral responses they have to anger-provoking situations.  They identified a recent time they became angry and how they reacted.  They analyzed how their reaction was possibly beneficial and how it was possibly unhelpful.  The group discussed a variety of healthier coping skills that could help with such a situation in the future.  Deep breathing was practiced briefly.  Therapeutic Goals: 1. Patients will remember their last incident of anger and how they felt emotionally and physically, what their thoughts were at the time, and how they behaved. 2. Patients will identify how their behavior at that time worked for them, as well as how it worked against them. 3. Patients will explore possible new behaviors to use in future anger situations. 4. Patients will learn that anger itself is normal and cannot be eliminated, and that healthier reactions can assist with resolving conflict rather than worsening situations.  Summary of Patient Progress:  The patient shared that her most recent time of anger is "now" and said this is because of being here in the hospital and why she is here.  She said she reacts to anger by crying.  She said she feels hopeless and helpless and like there is nothing she can do about her anger or anything else in life right now.  Therapeutic Modalities:   Cognitive Behavioral Therapy  Lynnell Chad

## 2018-10-23 NOTE — Progress Notes (Cosign Needed)
Adult Psychoeducational Group Note  Date:  10/23/2018 Time: 8:30am-9:30am   Group Topic/Focus:  Goals Group:   The focus of this group is to help patients establish daily goals to achieve during treatment and discuss how the patient can incorporate goal setting into their daily lives to aide in recovery.  Participation Level:  None  Participation Quality:  Inattentive  Affect:  Tearful  Cognitive:  Alert and Oriented  Insight: None  Engagement in Group:  None  Modes of Intervention:  Discussion and Support  Additional Comments:  Patient did not participate in group. Patient informed MHT after group that she would like to speak with MHT privately. Patient expressed feeling like she had not woken up this morning. Patient informed MHT that she would rate her day as a 0. MHT inquired about whether or not patient had expressed these feelings to CSW or MD. Patient denied effort to share this information. MHT encouraged patient to speak with CSW and MD regarding these feelings. MHT provided patient with a safe space to express her feelings and emotions. Patient informed MHT that she felt worthless and felt like she did not have any purpose or anyone to help her now that she needed it. Patient informed MHT that she has always been a rock for other people and always had to be strong. Patient expressed feeling like the space she is currently in where she is constantly crying is a space that she considers to be weak. MHT reminded patient that dealing with feelings and emotions makes will make her stronger because she will be able to work through them and move forward. MHT helped patient to think through the importance of releasing feelings and emotions rather than holding them in. MHT pointed out that patient's heart for others is important and that she is important. MHT challenged patient to think through 5 things that are positive about herself. Patient went to group with CSW following talk with MHT.    Annye Asa 10/23/2018, 6:27 PM

## 2018-10-23 NOTE — Progress Notes (Signed)
D.  Pt pleasant on approach, complaint of some swelling in her feet.  Pt states that this happened the last time she was here two weeks ago and about three days after going home it went away.  Pt would like to speak to doctor about this.  Pt instructed to elevate feet tonight as much as possible.  Pt agreeable to that.  Pt was positive for evening wrap up group but did not participate.  Pt denies SI/HIAVH at this time.  A.  Support and encouragement offered.  R. Pt remains safe on the unit, will continue to monitor.

## 2018-10-23 NOTE — [facility] Group Notes (Signed)
Adult Psychoeducational Group Note  Date:  10/23/2018 Time:  8:52 PM  Group Topic/Focus:  Wrap-Up Group:   The focus of this group is to help patients review their daily goal of treatment and discuss progress on daily workbooks.  Participation Level:  None  Participation Quality:  Inattentive and Supportive  Affect:  Depressed and Flat  Cognitive:  Alert and Appropriate  Insight: None  Engagement in Group:  None and Supportive  Modes of Intervention:  Discussion, Education and Support  Additional Comments:  Pt attended group, but opted not to participate in group.   Chrisandra Netters 10/23/2018, 8:52 PM

## 2018-10-23 NOTE — Progress Notes (Signed)
[facility] MD Progress Note  10/23/2018 12:02 PM Christina [name]  MRN:  [id number]   Subjective: I wish I wouldn't have woken up this morning. Patient endorses sleeping ok with the help of medication. Pt endorses feelings depression and hopelessness.Pt also states that she has been suffering from depression all her life but it has increased since 2011. She never sought treatment for depression for year due to the stigma of being seen by a psychiatrist. She stated that she lost her job due to chronic back pain and sciatica. She verablized that her depression increased since the lost of her job in 2011. She also complains of having a poor appetite. She also stated that Protonix upsets her bowels and does not take anymore.She also verbalized that her family is unaware that she is being hospitalized currently. This is her second admission in one month. Pt with a flat affect. We stated that we could help assist her with finding shelter. She stated that her case was being looked for obtaining disability. Pt verbalizes feeling better in the past when she was treated with antidepressants so she stopped taking them. Now she verbalizes not being able to afford her medications at this time.Pt appears very disengaged when asked if she was attempting to seek shelter and finding help. She refrains from eye contact.   Objective: Patient's chart and findings reviewed and discussed with treatment team. Patient observed in the day room coloring and partially interacting with her peers. During the evaluation she is observed with a flat affect and partially engaged. Patient continues to ruminate on multitude of psychosocial issues, however she is able to verbalize that she understands we are not able to help with all her stressors.  She endorses sleeping ok with the help of medications as per patient.  She reports passive SI and anhedonia at this time, however is able to contract for safety while on the unit.   Principal Problem: MDD  (major depressive disorder), single episode, severe , no psychosis (HCC)   Diagnosis: Principal Problem:   MDD (major depressive disorder), single episode, severe , no psychosis (HCC)  Total Time spent with patient: 30 minutes  Past Psychiatric History: Patient had a previous psychiatric admission here approximately 2 weeks ago.  She had been followed as an outpatient at the [facility] behavioral health clinic before she lost her insurance.  She has been treated in the past with several medications including Depakote, Latuda, gabapentin, Topamax, Risperdal, Cymbalta, gabapentin, hydroxyzine, prazosin and trazodone.  Past Medical History:  Past Medical History:  Diagnosis Date  . Anxiety   . Back pain   . 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 INTRAVASCULAR ULTRASOUND/IVUS Left 11/03/2016   Procedure: Intravascular Ultrasound/IVUS;  Surgeon: Maeola Harman, MD;  Location: [facility] INVASIVE CV LAB;  Service: Cardiovascular;  Laterality: Left;  . LOWER EXTREMITY VENOGRAPHY Left 11/04/2016   Procedure: Lower Extremity Venography;  Surgeon: Nada Libman, MD;  Location: [facility] INVASIVE CV LAB;  Service: Cardiovascular;  Laterality: Left;  . PERIPHERAL VASCULAR INTERVENTION Left 11/04/2016   Procedure: Peripheral Vascular Intervention;  Surgeon: Nada Libman, MD;  Location: [facility] INVASIVE CV LAB;  Service: Cardiovascular;  Laterality: Left;  common external  . PERIPHERAL VASCULAR THROMBECTOMY Left 11/03/2016   Procedure: Peripheral Vascular Thrombectomy;  Surgeon: Maeola Harman, MD;  Location: [facility] INVASIVE CV LAB;  Service: Cardiovascular;  Laterality: Left;   Family History: History  reviewed. No pertinent family history. Family Psychiatric  History: History reviewed.  No pertinent family history. Social History:  Social History   Substance and Sexual Activity  Alcohol Use Yes     Social History    Substance and Sexual Activity  Drug Use Yes  . Types: Marijuana    Social History   Socioeconomic History  . Marital status: Divorced    Spouse name: Not on file  . Number of children: Not on file  . Years of education: Not on file  . Highest education level: Not on file  Occupational History  . Not on file  Social Needs  . Financial resource strain: Very hard  . Food insecurity:    Worry: Sometimes true    Inability: Sometimes true  . Transportation needs:    Medical: No    Non-medical: No  Tobacco Use  . Smoking status: Current Every Day Smoker    Packs/day: 0.25    Types: Cigarettes  . Smokeless tobacco: Never Used  Substance and Sexual Activity  . Alcohol use: Yes  . Drug use: Yes    Types: Marijuana  . Sexual activity: Yes    Birth control/protection: Other-see comments    Comment: Tubal ligation  Lifestyle  . Physical activity:    Days per week: 2 days    Minutes per session: 10 min  . Stress: Very much  Relationships  . Social connections:    Talks on phone: Three times a week    Gets together: More than three times a week    Attends religious service: Never    Active member of club or organization: No    Attends meetings of clubs or organizations: Never    Relationship status: Divorced  Other Topics Concern  . Not on file  Social History Narrative  . Not on file   Additional Social History:    Pain Medications: see MAR Prescriptions: see MAR Over the Counter: see MAR History of alcohol / drug use?: Yes Name of Substance 1: THC 1 - Age of First Use: UTA 1 - Amount (size/oz): varies 1 - Frequency: "whenever I can get it" 1 - Duration: "years" 1 - Last Use / Amount: 10/20/2018   Sleep: Good  Appetite:  Good  Current Medications: Current Facility-Administered Medications  Medication Dose Route Frequency Provider Last Rate Last Dose  . acetaminophen (TYLENOL) tablet 650 mg  650 mg Oral Q6H PRN Antonieta Pertlary, Greg Lawson, MD   650 mg at 10/21/18  1711  . DULoxetine (CYMBALTA) DR capsule 60 mg  60 mg Oral Daily Antonieta Pertlary, Greg Lawson, MD   60 mg at 10/23/18 0747  . furosemide (LASIX) tablet 20 mg  20 mg Oral Daily Antonieta Pertlary, Greg Lawson, MD   20 mg at 10/22/18 [id number]  . gabapentin (NEURONTIN) capsule 300 mg  300 mg Oral TID Antonieta Pertlary, Greg Lawson, MD   300 mg at 10/23/18 0747  . hydrOXYzine (ATARAX/VISTARIL) tablet 25 mg  25 mg Oral Q6H PRN Antonieta Pertlary, Greg Lawson, MD      . ibuprofen (ADVIL,MOTRIN) tablet 200 mg  200 mg Oral Q6H PRN Antonieta Pertlary, Greg Lawson, MD      . nicotine (NICODERM CQ - dosed in mg/24 hours) patch 21 mg  21 mg Transdermal Daily Antonieta Pertlary, Greg Lawson, MD      . pantoprazole (PROTONIX) EC tablet 40 mg  40 mg Oral Daily Antonieta Pertlary, Greg Lawson, MD   40 mg at 10/21/18 1711  . prazosin (MINIPRESS) capsule 1 mg  1 mg  Oral QHS Antonieta Pert, MD   1 mg at 10/22/18 2113  . rivaroxaban (XARELTO) tablet 20 mg  20 mg Oral Daily Antonieta Pert, MD   20 mg at 10/23/18 0747  . traZODone (DESYREL) tablet 50 mg  50 mg Oral QHS PRN Antonieta Pert, MD   50 mg at 10/22/18 2113    Lab Results:  Results for orders placed or performed during the hospital encounter of 10/21/18 (from the past 48 hour(s))  Rapid urine drug screen (hospital performed)     Status: Abnormal   Collection Time: 10/22/18  8:15 AM  Result Value Ref Range   Opiates NONE DETECTED NONE DETECTED   Cocaine NONE DETECTED NONE DETECTED   Benzodiazepines POSITIVE (A) NONE DETECTED   Amphetamines NONE DETECTED NONE DETECTED   Tetrahydrocannabinol POSITIVE (A) NONE DETECTED   Barbiturates NONE DETECTED NONE DETECTED    Comment: (NOTE) DRUG SCREEN FOR MEDICAL PURPOSES ONLY.  IF CONFIRMATION IS NEEDED FOR ANY PURPOSE, NOTIFY LAB WITHIN 5 DAYS. LOWEST DETECTABLE LIMITS FOR URINE DRUG SCREEN Drug Class                     Cutoff (ng/mL) Amphetamine and metabolites    1000 Barbiturate and metabolites    200 Benzodiazepine                 200 Tricyclics and metabolites     300 Opiates  and metabolites        300 Cocaine and metabolites        300 THC                            50 Performed at [facility], [address], [place], [place] [address]   Glucose, capillary     Status: None   Collection Time: 10/22/18 11:48 AM  Result Value Ref Range   Glucose-Capillary 99 70 - 99 mg/dL   Comment 1 Notify RN    Comment 2 Document in Chart   Glucose, capillary     Status: Abnormal   Collection Time: 10/23/18  6:18 AM  Result Value Ref Range   Glucose-Capillary 116 (H) 70 - 99 mg/dL    Blood Alcohol level:  Lab Results  Component Value Date   ETH <10 09/22/2018    Metabolic Disorder Labs: Lab Results  Component Value Date   HGBA1C 6.0 (H) 09/30/2018   MPG 125.5 09/30/2018   No results found for: PROLACTIN Lab Results  Component Value Date   CHOL 195 09/30/2018   TRIG 105 09/30/2018   HDL 57 09/30/2018   CHOLHDL 3.4 09/30/2018   VLDL 21 09/30/2018   LDLCALC 117 (H) 09/30/2018    Physical Findings: AIMS: Facial and Oral Movements Muscles of Facial Expression: None, normal Lips and Perioral Area: None, normal Jaw: None, normal Tongue: None, normal,Extremity Movements Upper (arms, wrists, hands, fingers): None, normal Lower (legs, knees, ankles, toes): None, normal, Trunk Movements Neck, shoulders, hips: None, normal, Overall Severity Severity of abnormal movements (highest score from questions above): None, normal Incapacitation due to abnormal movements: None, normal, Dental Status Current problems with teeth and/or dentures?: No Does patient usually wear dentures?: No   Musculoskeletal: Strength & Muscle Tone: within normal limits Gait & Station: normal Patient leans: N/A  Psychiatric Specialty Exam: Physical Exam  Nursing note and vitals reviewed. Constitutional: She is oriented to person, place, and time. She appears well-developed and well-nourished.  Cardiovascular: Normal rate.  Respiratory: Effort normal.   Musculoskeletal: Normal range of motion.  Neurological: She is alert and oriented to person, place, and time.  Skin: Skin is warm.    Review of Systems  Constitutional: Negative.   HENT: Negative.   Eyes: Negative.   Respiratory: Negative.   Cardiovascular: Negative.   Gastrointestinal: Negative.   Genitourinary: Negative.   Musculoskeletal: Negative.   Skin: Negative.   Neurological: Negative.   Endo/Heme/Allergies: Negative.   Psychiatric/Behavioral: Positive for depression. Negative for hallucinations and suicidal ideas.    Blood pressure 108/84, pulse 95, temperature 97.9 F (36.6 C), resp. rate 16, height 5\' 6"  (1.676 m), weight 71 kg, SpO2 100 %.Body mass index is 25.26 kg/m.  General Appearance: Casual  Eye Contact:  Good  Speech:  Clear and Coherent and Normal Rate  Volume:  Normal  Mood:  Depressed  Affect:  Depressed, Flat and disengaged  Thought Process:  Linear and Descriptions of Associations: Intact  Orientation:  Full (Time, Place, and Person)  Thought Content:  WDL  Suicidal Thoughts:  No  Homicidal Thoughts:  No  Memory:  Immediate;   Good Recent;   Good Remote;   Good  Judgement:  Fair  Insight:  Fair  Psychomotor Activity:  Normal  Concentration:  Concentration: Good and Attention Span: Good  Recall:  Good  Fund of Knowledge:  Good  Language:  Good  Akathisia:  No  Handed:  Right  AIMS (if indicated):     Assets:  Communication Skills Desire for Improvement Financial Resources/Insurance Housing Physical Health Social Support Transportation  ADL's:  Intact  Cognition:  WNL  Sleep:  Number of Hours: 6.75   Problems Addressed Peripheral edema MDD severe 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 3 times daily Continue prazosin 1 mg p.o.  nightly Continue Vistaril 25 mg p.o. every 6 hours as needed for anxiety Continue trazodone 50 mg p.o. nightly as needed for insomnia Encourage group therapy participation. Patient also encouraged to talk with CSW about housing resources and initiating phone calls at this time. Continue Cymbalta 60 mg po daily will increase Cymbalta 80mg  po daily. Will continue to titrate.    Will continue Lasix 20mg  po daily at this time for edema and swelling.  Maryagnes Amos, FNP 10/23/2018, 12:02 PM

## 2018-10-23 NOTE — Progress Notes (Signed)
D. Pt presents with a sad affect and depressed mood- complaints of ongoing pain in legs and back 7/10, but refuses pain medication stating, "it isn't going to touch it". Per pt's self inventory, pt rates her depression, hopelessness and anxiety all 10's today. Pt writes that her most important goal today is "living" and writes that she will "pray" to help her meet that goal. Pt currently denies SI/HI and AV hallucinations. Pt supported emotionally and encouraged to express concerns and ask questions.   R. Pt remains safe with 15 minute checks. Will continue POC.

## 2018-10-24 LAB — GLUCOSE, CAPILLARY: Glucose-Capillary: 107 mg/dL — ABNORMAL HIGH (ref 70–99)

## 2018-10-24 MED ORDER — GABAPENTIN 400 MG PO CAPS
400.0000 mg | ORAL_CAPSULE | Freq: Three times a day (TID) | ORAL | Status: DC
  Administered 2018-10-24 – 2018-10-28 (×13): 400 mg via ORAL
  Filled 2018-10-24 (×8): qty 1
  Filled 2018-10-24: qty 21
  Filled 2018-10-24 (×3): qty 1
  Filled 2018-10-24: qty 21
  Filled 2018-10-24 (×4): qty 1
  Filled 2018-10-24: qty 21

## 2018-10-24 MED ORDER — DULOXETINE HCL 60 MG PO CPEP
90.0000 mg | ORAL_CAPSULE | Freq: Every day | ORAL | Status: DC
  Administered 2018-10-25 – 2018-10-28 (×4): 90 mg via ORAL
  Filled 2018-10-24 (×6): qty 1

## 2018-10-24 MED ORDER — IBUPROFEN 600 MG PO TABS: 600.0000 mg | ORAL_TABLET | Freq: Four times a day (QID) | ORAL | Status: DC | PRN

## 2018-10-24 MED ORDER — TRAMADOL HCL 50 MG PO TABS
50.0000 mg | ORAL_TABLET | Freq: Four times a day (QID) | ORAL | Status: DC | PRN
  Administered 2018-10-24 – 2018-10-27 (×8): 50 mg via ORAL
  Filled 2018-10-24 (×8): qty 1

## 2018-10-24 NOTE — Progress Notes (Signed)
[facility] MD Progress Note  10/24/2018 8:50 AM Christina [name]  MRN:  [id number] Subjective: Patient is a 48 year old female with a past psychiatric history significant for major depression as well as posttraumatic stress disorder.  She was admitted on 2/28 with worsening depressive symptoms.  Objective: Patient is a 48 year old female who is seen and examined in follow-up.  She is essentially unchanged.  She remains very ruminative on her past trauma as well as her past history.  She stated that she has focused on others and help them, and she is gotten to a point now that she is unable to deal with her life stressors.  We discussed trying to approach one small change at a time versus attempting to move out of a certain area of her thought process, but she became frustrated with this.  She stated that she is "done".  She denied suicidal or homicidal ideation.  She wants an answer to be handed to her as to what to change and what to do and is frustrated by my open ended this over this.  She has a couple of different somatic complaints including leg swelling and back pain.  Her sciatic back pain has been present for a great deal of time.  She stated that the swelling in her leg occurred while she was in the hospital, but had resolved after several days after discharge.  Social work stated that they are working on a shelter for her to be able to go to, and the patient stated that she was willing to do that yesterday or today.  She is much more obtuse about that today.  Her vital signs are stable, she is afebrile.  She slept 6.25 hours last night.  Principal Problem: MDD (major depressive disorder), single episode, severe , no psychosis (HCC) Diagnosis: Principal Problem:   MDD (major depressive disorder), single episode, severe , no psychosis (HCC)  Total Time spent with patient: 20 minutes  Past Psychiatric History: See admission H&P  Past Medical History:  Past Medical History:  Diagnosis Date  . Anxiety    . Back pain   . 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 INTRAVASCULAR ULTRASOUND/IVUS Left 11/03/2016   Procedure: Intravascular Ultrasound/IVUS;  Surgeon: Maeola Harman, MD;  Location: [facility] INVASIVE CV LAB;  Service: Cardiovascular;  Laterality: Left;  . LOWER EXTREMITY VENOGRAPHY Left 11/04/2016   Procedure: Lower Extremity Venography;  Surgeon: Nada Libman, MD;  Location: [facility] INVASIVE CV LAB;  Service: Cardiovascular;  Laterality: Left;  . PERIPHERAL VASCULAR INTERVENTION Left 11/04/2016   Procedure: Peripheral Vascular Intervention;  Surgeon: Nada Libman, MD;  Location: [facility] INVASIVE CV LAB;  Service: Cardiovascular;  Laterality: Left;  common external  . PERIPHERAL VASCULAR THROMBECTOMY Left 11/03/2016   Procedure: Peripheral Vascular Thrombectomy;  Surgeon: Maeola Harman, MD;  Location: [facility] INVASIVE CV LAB;  Service: Cardiovascular;  Laterality: Left;   Family History: History reviewed. No pertinent family history. Family Psychiatric  History: See admission H&P Social History:  Social History   Substance and Sexual Activity  Alcohol Use Yes     Social History   Substance and Sexual Activity  Drug Use Yes  . Types: Marijuana    Social History   Socioeconomic History  . Marital status: Divorced    Spouse name: Not on file  . Number of children: Not on file  . Years of education: Not on file  .  Highest education level: Not on file  Occupational History  . Not on file  Social Needs  . Financial resource strain: Very hard  . Food insecurity:    Worry: Sometimes true    Inability: Sometimes true  . Transportation needs:    Medical: No    Non-medical: No  Tobacco Use  . Smoking status: Current Every Day Smoker    Packs/day: 0.25    Types: Cigarettes  . Smokeless tobacco: Never Used  Substance and Sexual Activity  . Alcohol use: Yes  . Drug use: Yes    Types: Marijuana   . Sexual activity: Yes    Birth control/protection: Other-see comments    Comment: Tubal ligation  Lifestyle  . Physical activity:    Days per week: 2 days    Minutes per session: 10 min  . Stress: Very much  Relationships  . Social connections:    Talks on phone: Three times a week    Gets together: More than three times a week    Attends religious service: Never    Active member of club or organization: No    Attends meetings of clubs or organizations: Never    Relationship status: Divorced  Other Topics Concern  . Not on file  Social History Narrative  . Not on file   Additional Social History:    Pain Medications: see MAR Prescriptions: see MAR Over the Counter: see MAR History of alcohol / drug use?: Yes Name of Substance 1: THC 1 - Age of First Use: UTA 1 - Amount (size/oz): varies 1 - Frequency: "whenever I can get it" 1 - Duration: "years" 1 - Last Use / Amount: 10/20/2018                  Sleep: Good  Appetite:  Good  Current Medications: Current Facility-Administered Medications  Medication Dose Route Frequency Provider Last Rate Last Dose  . acetaminophen (TYLENOL) tablet 650 mg  650 mg Oral Q6H PRN Antonieta Pertlary, Roselle Norton Lawson, MD   650 mg at 10/21/18 1711  . [START ON 10/25/2018] DULoxetine (CYMBALTA) DR capsule 90 mg  90 mg Oral Daily Antonieta Pertlary, Estill Llerena Lawson, MD      . furosemide (LASIX) tablet 20 mg  20 mg Oral Daily Antonieta Pertlary, Daveigh Batty Lawson, MD   20 mg at 10/24/18 0802  . gabapentin (NEURONTIN) capsule 300 mg  300 mg Oral TID Antonieta Pertlary, Cambri Plourde Lawson, MD   300 mg at 10/24/18 0802  . hydrOXYzine (ATARAX/VISTARIL) tablet 25 mg  25 mg Oral Q6H PRN Antonieta Pertlary, Nira Visscher Lawson, MD   25 mg at 10/23/18 2144  . ibuprofen (ADVIL,MOTRIN) tablet 200 mg  200 mg Oral Q6H PRN Antonieta Pertlary, Ibraham Levi Lawson, MD   200 mg at 10/23/18 2144  . nicotine (NICODERM CQ - dosed in mg/24 hours) patch 21 mg  21 mg Transdermal Daily Antonieta Pertlary, Suki Crockett Lawson, MD      . prazosin (MINIPRESS) capsule 1 mg  1 mg Oral QHS Antonieta Pertlary,  Haidy Kackley Lawson, MD   1 mg at 10/23/18 2144  . rivaroxaban (XARELTO) tablet 20 mg  20 mg Oral Daily Antonieta Pertlary, Emerald Gehres Lawson, MD   20 mg at 10/24/18 0802  . traZODone (DESYREL) tablet 50 mg  50 mg Oral QHS PRN Antonieta Pertlary, Azarya Oconnell Lawson, MD   50 mg at 10/23/18 2144    Lab Results:  Results for orders placed or performed during the hospital encounter of 10/21/18 (from the past 48 hour(s))  Glucose, capillary     Status: None  Collection Time: 10/22/18 11:48 AM  Result Value Ref Range   Glucose-Capillary 99 70 - 99 mg/dL   Comment 1 Notify RN    Comment 2 Document in Chart   Glucose, capillary     Status: Abnormal   Collection Time: 10/23/18  6:18 AM  Result Value Ref Range   Glucose-Capillary 116 (H) 70 - 99 mg/dL  Glucose, capillary     Status: Abnormal   Collection Time: 10/24/18  6:25 AM  Result Value Ref Range   Glucose-Capillary 107 (H) 70 - 99 mg/dL   Comment 1 Notify RN    Comment 2 Document in Chart     Blood Alcohol level:  Lab Results  Component Value Date   ETH <10 09/22/2018    Metabolic Disorder Labs: Lab Results  Component Value Date   HGBA1C 6.0 (H) 09/30/2018   MPG 125.5 09/30/2018   No results found for: PROLACTIN Lab Results  Component Value Date   CHOL 195 09/30/2018   TRIG 105 09/30/2018   HDL 57 09/30/2018   CHOLHDL 3.4 09/30/2018   VLDL 21 09/30/2018   LDLCALC 117 (H) 09/30/2018    Physical Findings: AIMS: Facial and Oral Movements Muscles of Facial Expression: None, normal Lips and Perioral Area: None, normal Jaw: None, normal Tongue: None, normal,Extremity Movements Upper (arms, wrists, hands, fingers): None, normal Lower (legs, knees, ankles, toes): None, normal, Trunk Movements Neck, shoulders, hips: None, normal, Overall Severity Severity of abnormal movements (highest score from questions above): None, normal Incapacitation due to abnormal movements: None, normal, Dental Status Current problems with teeth and/or dentures?: No Does patient usually  wear dentures?: No  CIWA:    COWS:     Musculoskeletal: Strength & Muscle Tone: within normal limits Gait & Station: normal Patient leans: N/A  Psychiatric Specialty Exam: Physical Exam  Nursing note and vitals reviewed. Constitutional: She is oriented to person, place, and time. She appears well-developed and well-nourished.  HENT:  Head: Normocephalic and atraumatic.  Respiratory: Effort normal.  Neurological: She is alert and oriented to person, place, and time.    ROS  Blood pressure 110/75, pulse 78, temperature 97.9 F (36.6 C), resp. rate 16, height 5\' 6"  (1.676 m), weight 71 kg, SpO2 100 %.Body mass index is 25.26 kg/m.  General Appearance: Casual  Eye Contact:  Fair  Speech:  Normal Rate  Volume:  Decreased  Mood:  Anxious  Affect:  Congruent  Thought Process:  Coherent and Descriptions of Associations: Intact  Orientation:  Full (Time, Place, and Person)  Thought Content:  Logical  Suicidal Thoughts:  No  Homicidal Thoughts:  No  Memory:  Immediate;   Fair Recent;   Fair Remote;   Fair  Judgement:  Intact  Insight:  Lacking  Psychomotor Activity:  Normal  Concentration:  Concentration: Fair and Attention Span: Fair  Recall:  [employer] of Knowledge:  Fair  Language:  Fair  Akathisia:  Negative  Handed:  Right  AIMS (if indicated):     Assets:  Desire for Improvement Resilience  ADL's:  Intact  Cognition:  WNL  Sleep:  Number of Hours: 6.25     Treatment Plan Summary: Daily contact with patient to assess and evaluate symptoms and progress in treatment, Medication management and Plan : Patient is seen and examined.  Patient is a 48 year old female with the above-stated past psychiatric history who is seen in follow-up.  She is essentially unchanged.  We will increase her duloxetine to 90 mg p.o. daily.  I will also increase her Motrin to 600 mg p.o. every 6 hours as needed pain for her pain.  We will continue the prazosin at its current dosage.  She also  continues on Xarelto.  Social work is working on housing, and hopefully they will be able to find a shelter for the patient. 1.  Increase duloxetine to 90 mg p.o. daily. 2.  Continue Tylenol and ibuprofen for pain issues. 3.  Increase gabapentin to 400 mg p.o. 3 times daily for mood stability as well as pain. 4.  Continue prazosin 1 mg p.o. nightly for nightmares and flashbacks. 5.  Continue Xarelto for history of DVT. 6.  Continue trazodone 50 mg p.o. nightly as needed insomnia. 7.  Disposition planning-in process.  Antonieta Pert, MD 10/24/2018, 8:50 AM

## 2018-10-24 NOTE — [facility] Group Notes (Signed)
[facility] Group Notes:  (Nursing/MHT/Case Management/Adjunct)  Date:  10/24/2018  Time:  6:28 PM  Type of Therapy:  Nurse Education  Participation Level:  Active  Participation Quality:  Appropriate and Attentive  Affect:  Appropriate  Cognitive:  Alert and Appropriate  Insight:  Good  Engagement in Group:  Engaged  Modes of Intervention:  Activity and Discussion  Summary of Progress/Problems: In this group, the RN reviewed healthy coping strategies. The patients were taught why they struggle with symptoms of depression and how to work through them. The patient was cooperative and participated.  Kirstie Mirza 10/24/2018, 6:28 PM

## 2018-10-24 NOTE — [facility] Group Notes (Signed)
[facility] LCSW Group Therapy Note  10/24/2018   10:00-11:00AM  Type of Therapy and Topic:  Group Therapy:  Unhealthy versus Healthy Supports, Which Am I?  Participation Level:  Active   Description of Group:  Patients in this group were introduced to the concept that additional supports including self-support are an essential part of recovery.  Initially a discussion was held about the differences between healthy versus unhealthy supports.  Patients were asked to share what unhealthy supports in their lives need to be addressed, as well as what additional healthy supports could be added for greater help in reaching their goals.   A song entitled "My Own Hero" was played and a group discussion ensued in which patients stated they could relate to the song and it inspired them to realize they have be willing to help themselves in order to succeed, because other people cannot achieve sobriety or stability for them.  We discussed adding a variety of healthy supports to address the various needs in patient lives, including becoming more self-supportive.  Therapeutic Goals: 1)  Highlight the differences between healthy and unhealthy supports 2)  Suggest the importance of being a part of one's own support system 2)  Discuss reasons people in one's life may eventually be unable to be continually supportive  3)  Identify the patient's current support system and   4)  elicit commitments to add healthy supports and to become more conscious of being self-supportive   Summary of Patient Progress:  The patient expressed that the unhealthy support which needs to be addressed includes that she has only one support, a friend who has been there "through thick and thin for 23 years."  Healthy supports which could be added for increased stability and happiness include herself, and she stated "I need to learn how to rely on myself more and not rely on other people to help me."  The group discussed using a therapist to go  through that learning process.  She additionally is trying to rely on her faith, and said she needs housing..  Therapeutic Modalities:   Motivational Interviewing Activity  Lynnell Chad

## 2018-10-24 NOTE — Progress Notes (Signed)
D.  Pt pleasant on approach, states that her feet are really hurting.  Pt states she mentioned this to the doctor today and did receive new pain medication ordered, but that she wants to know why this is happening.  Pt was positive for evening wrap up group, observed engaged in appropriate interaction with peers on the unit.  Pt denies SI/HI but does continue to endorse some visual hallucinations in her periphery.  A.  Support and encouragement offered, medication given as ordered  R.  Pt remains safe on the unit, will continue to monitor.

## 2018-10-24 NOTE — [facility] Counselor (Signed)
Clinical Social Work Note  CSW has tried 3 times to call [facility] in [place] for placement.  There is no answer and they have not returned messages.  Patient only willing to go to a shelter such as this one, which is smaller and located in a house where there are a limited number of women per room.  She states her anxiety would prevent her from being able to sleep in a larger facility or one where everyone slept in one large room.  Ambrose Mantle, LCSW 10/24/2018, 9:46 AM

## 2018-10-24 NOTE — Plan of Care (Signed)
D: Patient presents depressed, irritable. She complains of chronic pain in her lower back 8-9/10. She says it is unrelieved by motrin or APAP. MD started on tramadol, and I administered. Patient denies SI/HI/AH. She is experiencing illusions/VH in her peripheral vision that have been going on for some time. She also has BLE edema that started when she arrived at the hospital. She refused her nicotine patch and denies any cravings. She slept fair last night, and received medication that was helpful. Her appetite is poor, energy low and concentration poor. She rates her depression and hopelessness 8/10 and anxiety 9/10. She denies withdrawal symptoms.  A: Patient checked q15 min, and checks reviewed. Reviewed medication changes with patient and educated on side effects. Educated patient on importance of attending group therapy sessions and educated on several coping skills. Encouarged participation in milieu through recreation therapy and attending meals with peers. Support and encouragement provided. Fluids offered. R: Patient receptive to education on medications, and is medication compliant. Patient contracts for safety on the unit. Her goal: " Getting through the day" and "Changing the way I view myself."  Problem: Activity: Goal: Sleeping patterns will improve Outcome: Progressing   Problem: Education: Goal: Emotional status will improve Outcome: Not Progressing Goal: Mental status will improve Outcome: Not Progressing   Problem: Activity: Goal: Interest or engagement in activities will improve Outcome: Not Progressing

## 2018-10-24 NOTE — [facility] Group Notes (Signed)
Adult Psychoeducational Group Note  Date:  10/24/2018 Time:  9:07 PM  Group Topic/Focus:  Wrap-Up Group:   The focus of this group is to help patients review their daily goal of treatment and discuss progress on daily workbooks.  Participation Level:  Active  Participation Quality:  Appropriate and Attentive  Affect:  Appropriate  Cognitive:  Alert and Appropriate  Insight: Appropriate and Good  Engagement in Group:  Engaged  Modes of Intervention:  Discussion and Education  Additional Comments:  Pt attended and participated in wrap up group this evening. Pt rated their day a 4/10, due to their hall mates helping to make their day better. Pt completed their goal, to "get their emotions under control", and they did not cry today.   Chrisandra Netters 10/24/2018, 9:07 PM

## 2018-10-25 ENCOUNTER — Inpatient Hospital Stay (INDEPENDENT_AMBULATORY_CARE_PROVIDER_SITE_OTHER): Payer: Self-pay | Admitting: Primary Care

## 2018-10-25 LAB — GLUCOSE, CAPILLARY: Glucose-Capillary: 102 mg/dL — ABNORMAL HIGH (ref 70–99)

## 2018-10-25 MED ORDER — FUROSEMIDE 40 MG PO TABS
40.0000 mg | ORAL_TABLET | Freq: Every day | ORAL | Status: DC
  Administered 2018-10-26 – 2018-10-28 (×2): 40 mg via ORAL
  Filled 2018-10-25 (×4): qty 1
  Filled 2018-10-25: qty 7
  Filled 2018-10-25: qty 1

## 2018-10-25 MED ORDER — TRAZODONE HCL 100 MG PO TABS
100.0000 mg | ORAL_TABLET | Freq: Every evening | ORAL | Status: DC | PRN
  Administered 2018-10-25 – 2018-10-27 (×3): 100 mg via ORAL
  Filled 2018-10-25 (×3): qty 1
  Filled 2018-10-25: qty 7

## 2018-10-25 MED ORDER — FUROSEMIDE 20 MG PO TABS
20.0000 mg | ORAL_TABLET | Freq: Once | ORAL | Status: AC
  Administered 2018-10-25: 20 mg via ORAL
  Filled 2018-10-25: qty 1

## 2018-10-25 NOTE — Progress Notes (Signed)
D: Pt passive SI-contracts for safety denies HI/AVH. Pt is pleasant and cooperative. Pt stated she was depressed today, but seemed to be feeling better slowly.  A: Pt was offered support and encouragement. Pt was given scheduled medications. Pt was encourage to attend groups. Q 15 minute checks were done for safety.  R:Pt attends groups and interacts well with peers and staff. Pt is taking medication. Pt receptive to treatment and safety maintained on unit.

## 2018-10-25 NOTE — Progress Notes (Signed)
The patient shared with the group that she had a good day since she socialized with her peers. She also shared that she had a good day since she had a good visit from her boyfriend this evening. Her goal for tomorrow is to work on becoming more independent.

## 2018-10-25 NOTE — [facility] Group Notes (Signed)
LCSW Group Therapy Note 10/25/2018 12:53 PM  Type of Therapy and Topic: Group Therapy: Overcoming Obstacles  Participation Level: Active  Description of Group:  In this group patients will be encouraged to explore what they see as obstacles to their own wellness and recovery. They will be guided to discuss their thoughts, feelings, and behaviors related to these obstacles. The group will process together ways to cope with barriers, with attention given to specific choices patients can make. Each patient will be challenged to identify changes they are motivated to make in order to overcome their obstacles. This group will be process-oriented, with patients participating in exploration of their own experiences as well as giving and receiving support and challenge from other group members.  Therapeutic Goals: 1. Patient will identify personal and current obstacles as they relate to admission. 2. Patient will identify barriers that currently interfere with their wellness or overcoming obstacles.  3. Patient will identify feelings, thought process and behaviors related to these barriers. 4. Patient will identify two changes they are willing to make to overcome these obstacles:   Summary of Patient Progress  Christina [name] was engaged and participated throughout the group session. Christina [name] reports that her main obstacle is "getting over the pain I've experienced throughout my life". She states her current barrier is "my heart, I do not know how to control my emotions. I need to learn how to manage them".    Therapeutic Modalities:  Cognitive Behavioral Therapy Solution Focused Therapy Motivational Interviewing Relapse Prevention Therapy   Alcario Drought Clinical Social Worker

## 2018-10-25 NOTE — Progress Notes (Signed)
Saw pt while rounding on 400 unit.  Chaplain provided brief support and encouragement around re-admission.  Pt open to chaplain follow up and support during this admission.

## 2018-10-25 NOTE — Progress Notes (Signed)
Recreation Therapy Notes  Date:  3.2.20 Time: 0930 Location: 300 Hall Dayroom  Group Topic: Stress Management  Goal Area(s) Addresses:  Patient will identify positive stress management techniques. Patient will identify benefits of using stress management post d/c.  Intervention: Stress Management  Activity :  Meditation.  LRT introduced the stress management technique of meditation.  LRT played a meditation that focused on having a productive and meaningful day.  Patients were to listen and follow along as meditation played.  Education:  Stress Management, Discharge Planning.   Education Outcome: Acknowledges Education  Clinical Observations/Feedback: Pt did not attend group.     Caroll Rancher, LRT/CTRS         Caroll Rancher A 10/25/2018 11:23 AM

## 2018-10-25 NOTE — Progress Notes (Signed)
[facility] MD Progress Note  10/25/2018 10:19 AM Christina [name]  MRN:  [id number] Subjective:  "I don't care anymore."  Christina [name] found sitting in her room. Presents with a flat affect, minimal eye contact and continues to report depressed mood. Cymbalta and Neurontin were both increased yesterday. She ruminates over past traumas, loss of family members from over the years. Denies thoughts of harming herself but reports more thoughts of wishing she could go to sleep and not wake up. She complains of difficulty sleeping at night due to excessive worry, with fatigue today. Record shows 4 hours of sleep. She also complains of BLE swelling with accompanying pain that feels like "tingling." Denies history of edema or foot pain, other than during recent hospitalization at [facility]. States bilateral foot pain and swelling went away after discharge and restarted on readmission. Denies AVH.   From admission H&P:Patient is a 48 year old female with a recent psychiatric hospitalization at our facility from 1/29 to 10/06/2018 for major depression and posttraumatic stress disorder. She also has a past medical history significant for thrombus issues as well as chronic bilateral low back pain with sciatica. The patient stated that after she was discharged in the hospital she stated a hotel for approximately 2 weeks. She then went to live with her sister. She stated that the environment she was in with her sister was not good because of drug and alcohol use. She stated she was unable to afford her medications, and started taking her antidepressant and other medications on an every other day basis. She stated her depression began to worsen at that time.  Principal Problem: MDD (major depressive disorder), single episode, severe , no psychosis (HCC) Diagnosis: Principal Problem:   MDD (major depressive disorder), single episode, severe , no psychosis (HCC)  Total Time spent with patient: 15 minutes  Past Psychiatric History:  See admission H&P  Past Medical History:  Past Medical History:  Diagnosis Date  . Anxiety   . Back pain   . 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 INTRAVASCULAR ULTRASOUND/IVUS Left 11/03/2016   Procedure: Intravascular Ultrasound/IVUS;  Surgeon: Maeola Harman, MD;  Location: [facility] INVASIVE CV LAB;  Service: Cardiovascular;  Laterality: Left;  . LOWER EXTREMITY VENOGRAPHY Left 11/04/2016   Procedure: Lower Extremity Venography;  Surgeon: Nada Libman, MD;  Location: [facility] INVASIVE CV LAB;  Service: Cardiovascular;  Laterality: Left;  . PERIPHERAL VASCULAR INTERVENTION Left 11/04/2016   Procedure: Peripheral Vascular Intervention;  Surgeon: Nada Libman, MD;  Location: [facility] INVASIVE CV LAB;  Service: Cardiovascular;  Laterality: Left;  common external  . PERIPHERAL VASCULAR THROMBECTOMY Left 11/03/2016   Procedure: Peripheral Vascular Thrombectomy;  Surgeon: Maeola Harman, MD;  Location: [facility] INVASIVE CV LAB;  Service: Cardiovascular;  Laterality: Left;   Family History: History reviewed. No pertinent family history. Family Psychiatric  History: See admission H&P Social History:  Social History   Substance and Sexual Activity  Alcohol Use Yes     Social History   Substance and Sexual Activity  Drug Use Yes  . Types: Marijuana    Social History   Socioeconomic History  . Marital status: Divorced    Spouse name: Not on file  . Number of children: Not on file  . Years of education: Not on file  . Highest education level: Not on file  Occupational History  . Not on file  Social Needs  .  Financial resource strain: Very hard  . Food insecurity:    Worry: Sometimes true    Inability: Sometimes true  . Transportation needs:    Medical: No    Non-medical: No  Tobacco Use  . Smoking status: Current Every Day Smoker    Packs/day: 0.25    Types: Cigarettes  . Smokeless tobacco: Never  Used  Substance and Sexual Activity  . Alcohol use: Yes  . Drug use: Yes    Types: Marijuana  . Sexual activity: Yes    Birth control/protection: Other-see comments    Comment: Tubal ligation  Lifestyle  . Physical activity:    Days per week: 2 days    Minutes per session: 10 min  . Stress: Very much  Relationships  . Social connections:    Talks on phone: Three times a week    Gets together: More than three times a week    Attends religious service: Never    Active member of club or organization: No    Attends meetings of clubs or organizations: Never    Relationship status: Divorced  Other Topics Concern  . Not on file  Social History Narrative  . Not on file   Additional Social History:    Pain Medications: see MAR Prescriptions: see MAR Over the Counter: see MAR History of alcohol / drug use?: Yes Name of Substance 1: THC 1 - Age of First Use: UTA 1 - Amount (size/oz): varies 1 - Frequency: "whenever I can get it" 1 - Duration: "years" 1 - Last Use / Amount: 10/20/2018                  Sleep: Poor  Appetite:  Fair  Current Medications: Current Facility-Administered Medications  Medication Dose Route Frequency Provider Last Rate Last Dose  . acetaminophen (TYLENOL) tablet 650 mg  650 mg Oral Q6H PRN Antonieta Pert, MD   650 mg at 10/21/18 1711  . DULoxetine (CYMBALTA) DR capsule 90 mg  90 mg Oral Daily Antonieta Pert, MD   90 mg at 10/25/18 0758  . furosemide (LASIX) tablet 20 mg  20 mg Oral Daily Antonieta Pert, MD   20 mg at 10/25/18 0758  . gabapentin (NEURONTIN) capsule 400 mg  400 mg Oral TID Antonieta Pert, MD   400 mg at 10/25/18 0758  . hydrOXYzine (ATARAX/VISTARIL) tablet 25 mg  25 mg Oral Q6H PRN Antonieta Pert, MD   25 mg at 10/24/18 2129  . nicotine (NICODERM CQ - dosed in mg/24 hours) patch 21 mg  21 mg Transdermal Daily Antonieta Pert, MD      . prazosin (MINIPRESS) capsule 1 mg  1 mg Oral QHS Antonieta Pert, MD    1 mg at 10/24/18 2129  . rivaroxaban (XARELTO) tablet 20 mg  20 mg Oral Daily Antonieta Pert, MD   20 mg at 10/25/18 0758  . traMADol (ULTRAM) tablet 50 mg  50 mg Oral Q6H PRN Antonieta Pert, MD   50 mg at 10/25/18 0800  . traZODone (DESYREL) tablet 50 mg  50 mg Oral QHS PRN Antonieta Pert, MD   50 mg at 10/24/18 2129    Lab Results:  Results for orders placed or performed during the hospital encounter of 10/21/18 (from the past 48 hour(s))  Glucose, capillary     Status: Abnormal   Collection Time: 10/24/18  6:25 AM  Result Value Ref Range   Glucose-Capillary 107 (H) 70 - 99  mg/dL   Comment 1 Notify RN    Comment 2 Document in Chart   Glucose, capillary     Status: Abnormal   Collection Time: 10/25/18  6:24 AM  Result Value Ref Range   Glucose-Capillary 102 (H) 70 - 99 mg/dL   Comment 1 Notify RN    Comment 2 Document in Chart     Blood Alcohol level:  Lab Results  Component Value Date   ETH <10 09/22/2018    Metabolic Disorder Labs: Lab Results  Component Value Date   HGBA1C 6.0 (H) 09/30/2018   MPG 125.5 09/30/2018   No results found for: PROLACTIN Lab Results  Component Value Date   CHOL 195 09/30/2018   TRIG 105 09/30/2018   HDL 57 09/30/2018   CHOLHDL 3.4 09/30/2018   VLDL 21 09/30/2018   LDLCALC 117 (H) 09/30/2018    Physical Findings: AIMS: Facial and Oral Movements Muscles of Facial Expression: None, normal Lips and Perioral Area: None, normal Jaw: None, normal Tongue: None, normal,Extremity Movements Upper (arms, wrists, hands, fingers): None, normal Lower (legs, knees, ankles, toes): None, normal, Trunk Movements Neck, shoulders, hips: None, normal, Overall Severity Severity of abnormal movements (highest score from questions above): None, normal Incapacitation due to abnormal movements: None, normal Patient's awareness of abnormal movements (rate only patient's report): No Awareness, Dental Status Current problems with teeth and/or  dentures?: No Does patient usually wear dentures?: No  CIWA:  CIWA-Ar Total: 0 COWS:  COWS Total Score: 0  Musculoskeletal: Strength & Muscle Tone: within normal limits Gait & Station: normal Patient leans: N/A  Psychiatric Specialty Exam: Physical Exam  Nursing note and vitals reviewed. Constitutional: She is oriented to person, place, and time. She appears well-developed and well-nourished.  Cardiovascular: Normal rate.  Respiratory: Effort normal.  Neurological: She is alert and oriented to person, place, and time.    Review of Systems  Constitutional: Negative.   Respiratory: Negative for shortness of breath.   Cardiovascular: Negative for chest pain and orthopnea.  Psychiatric/Behavioral: Positive for depression, substance abuse (UDS +THC, BZDs) and suicidal ideas. Negative for hallucinations and memory loss. The patient has insomnia. The patient is not nervous/anxious.     Blood pressure 111/80, pulse 78, temperature 97.9 F (36.6 C), temperature source Oral, resp. rate 16, height 5\' 6"  (1.676 m), weight 71 kg, SpO2 100 %.Body mass index is 25.26 kg/m.  General Appearance: Casual  Eye Contact:  Minimal  Speech:  Slow  Volume:  Normal  Mood:  Depressed  Affect:  Flat  Thought Process:  Coherent  Orientation:  Full (Time, Place, and Person)  Thought Content:  Rumination  Suicidal Thoughts:  Yes.  without intent/plan Contracts for safety on the unit.  Homicidal Thoughts:  No  Memory:  Immediate;   Fair Recent;   Fair  Judgement:  Fair  Insight:  Fair  Psychomotor Activity:  Decreased  Concentration:  Concentration: Fair  Recall:  [employer] of Knowledge:  Fair  Language:  Fair  Akathisia:  No  Handed:  Right  AIMS (if indicated):     Assets:  Communication Skills Leisure Time Resilience  ADL's:  Intact  Cognition:  WNL  Sleep:  Number of Hours: 4     Treatment Plan Summary: Daily contact with patient to assess and evaluate symptoms and progress in  treatment and Medication management   Continue inpatient hospitalization.  One time dose of Lasix 20 mg now; increase Lasix to 40 mg PO daily for BLE edema Check  BMP Increase trazodone to 100 mg PO QHS PRN insomnia Continue Cymbalta 90 mg PO daily for mood Continue gabapentin 400 mg PO TID for pain Continue Vistaril 25 mg PO Q6HR PRN anxiety Continue Minipress 1 mg PO QHS for nightmares Continue Xarelto 20 mg PO daily for anticoagulation Continue tramadol 50 mg PO Q6HR PRN pain  Patient will participate in the therapeutic group milieu.  Discharge disposition in progress.   Aldean Baker, NP 10/25/2018, 10:19 AM

## 2018-10-25 NOTE — Plan of Care (Signed)
Patient self inventory- Patient slept poor last night, sleep medication was requested and was not helpful. Appetite is poor, energy level low, concentration poor. Depression, hopelessness, and anxiety rated 8, 8, 9 out of 10. Paper asks: Have you had thoughts about suicide or hurting yourself today? Pt wrote in "Not yet." Endorses pain in "back, butt, legs, and feet." Says pain medicine is "kind of" helpful. Patient's goal is "finding reasons to live and work on loving myself."  Patient is compliant with medications prescribed per provider. No side effects noted. Safety is maintained with 15 minute checks as well as environmental checks. Will continue to monitor and provide support and encouragement.  Problem: Education: Goal: Verbalization of understanding the information provided will improve Outcome: Progressing   Problem: Activity: Goal: Interest or engagement in activities will improve Outcome: Progressing   Problem: Education: Goal: Emotional status will improve Outcome: Not Progressing Goal: Mental status will improve Outcome: Not Progressing   Problem: Activity: Goal: Sleeping patterns will improve Outcome: Not Progressing

## 2018-10-26 LAB — BASIC METABOLIC PANEL
Anion gap: 5 (ref 5–15)
BUN: 12 mg/dL (ref 6–20)
CO2: 27 mmol/L (ref 22–32)
Calcium: 8.6 mg/dL — ABNORMAL LOW (ref 8.9–10.3)
Chloride: 105 mmol/L (ref 98–111)
Creatinine, Ser: 0.71 mg/dL (ref 0.44–1.00)
GFR calc Af Amer: >60 mL/min (ref >60)
GFR calc non Af Amer: >60 mL/min (ref >60)
Glucose, Bld: 103 mg/dL — ABNORMAL HIGH (ref 70–99)
Potassium: 3.9 mmol/L (ref 3.5–5.1)
Sodium: 137 mmol/L (ref 135–145)

## 2018-10-26 LAB — GLUCOSE, CAPILLARY: GLUCOSE-CAPILLARY: 113 mg/dL — AB (ref 70–99)

## 2018-10-26 NOTE — Progress Notes (Addendum)
In response to Chaplain conversation with pt yesterday evening, Chaplain contacted Interior and spatial designer of [facility], Fransisco Beau, to inquire about admission criteria for [facility].  Unfortunately, [facility] cannot accept someone with recent active SI.    Sent email to Feliberto Harts, congregational nurse at [facility], to inquire about their admission criteria.  Burna Mortimer noted they have availability and expressed concern Re: pt's mobility, but is able to pass information to admissions at [facility] if SW and Pt feel is appropriate.  Reported to pt's SW, Baldo Daub.   Burnis Kingfisher, MDiv, [facility]

## 2018-10-26 NOTE — Progress Notes (Addendum)
[facility] MD Progress Note  10/26/2018 12:23 PM Leonda Cristo  MRN:  [id number] Subjective:  "I don't care anymore."  Ms. Prew was interviewed in the physicians office. She presents with a flat affect, minimal eye contact and continues to report depressed mood. Cymbalta and Neurontin were both increased yesterday. She ruminates over past traumas, loss of family members from over the years and various ongoing health issues.  Denies thoughts of harming herself but reports more thoughts of wishing she could go to sleep and not wake up. She kept repeating that she "is just tired of it all, I don't care anymore, and I am exhausted." She stated if she could get her medicaid and SSDI she could afford a place to live and her medical and mental health care and then she would feel like she has something to live for. She complains of difficulty sleeping at night due to excessive worry, with fatigue today. Record shows 4 hours of sleep. She also complains of BLE swelling with accompanying pain that feels like "tingling." Denies history of edema or foot pain, other than during recent hospitalization at [facility]. States bilateral foot pain and swelling went away after discharge and restarted on readmission. Also complains of low back pain. Denies AVH.   From admission H&P:Patient is a 48 year old female with a recent psychiatric hospitalization at our facility from 1/29 to 10/06/2018 for major depression and posttraumatic stress disorder. She also has a past medical history significant for thrombus issues as well as chronic bilateral low back pain with sciatica. The patient stated that after she was discharged in the hospital she stated a hotel for approximately 2 weeks. She then went to live with her sister. She stated that the environment she was in with her sister was not good because of drug and alcohol use. She stated she was unable to afford her medications, and started taking her antidepressant and other medications on  an every other day basis. She stated her depression began to worsen at that time.  Principal Problem: MDD (major depressive disorder), single episode, severe , no psychosis (HCC) Diagnosis: Principal Problem:   MDD (major depressive disorder), single episode, severe , no psychosis (HCC)  Total Time spent with patient: 15 minutes  Past Psychiatric History: See admission H&P  Past Medical History:  Past Medical History:  Diagnosis Date  . Anxiety   . Back pain   . 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 INTRAVASCULAR ULTRASOUND/IVUS Left 11/03/2016   Procedure: Intravascular Ultrasound/IVUS;  Surgeon: Maeola Harman, MD;  Location: [facility] INVASIVE CV LAB;  Service: Cardiovascular;  Laterality: Left;  . LOWER EXTREMITY VENOGRAPHY Left 11/04/2016   Procedure: Lower Extremity Venography;  Surgeon: Nada Libman, MD;  Location: [facility] INVASIVE CV LAB;  Service: Cardiovascular;  Laterality: Left;  . PERIPHERAL VASCULAR INTERVENTION Left 11/04/2016   Procedure: Peripheral Vascular Intervention;  Surgeon: Nada Libman, MD;  Location: [facility] INVASIVE CV LAB;  Service: Cardiovascular;  Laterality: Left;  common external  . PERIPHERAL VASCULAR THROMBECTOMY Left 11/03/2016   Procedure: Peripheral Vascular Thrombectomy;  Surgeon: Maeola Harman, MD;  Location: [facility] INVASIVE CV LAB;  Service: Cardiovascular;  Laterality: Left;   Family History: History reviewed. No pertinent family history. Family Psychiatric  History: See admission H&P Social History:  Social History   Substance and Sexual Activity  Alcohol Use Yes     Social History   Substance and  Sexual Activity  Drug Use Yes  . Types: Marijuana    Social History   Socioeconomic History  . Marital status: Divorced    Spouse name: Not on file  . Number of children: Not on file  . Years of education: Not on file  . Highest education level: Not on  file  Occupational History  . Not on file  Social Needs  . Financial resource strain: Very hard  . Food insecurity:    Worry: Sometimes true    Inability: Sometimes true  . Transportation needs:    Medical: No    Non-medical: No  Tobacco Use  . Smoking status: Current Every Day Smoker    Packs/day: 0.25    Types: Cigarettes  . Smokeless tobacco: Never Used  Substance and Sexual Activity  . Alcohol use: Yes  . Drug use: Yes    Types: Marijuana  . Sexual activity: Yes    Birth control/protection: Other-see comments    Comment: Tubal ligation  Lifestyle  . Physical activity:    Days per week: 2 days    Minutes per session: 10 min  . Stress: Very much  Relationships  . Social connections:    Talks on phone: Three times a week    Gets together: More than three times a week    Attends religious service: Never    Active member of club or organization: No    Attends meetings of clubs or organizations: Never    Relationship status: Divorced  Other Topics Concern  . Not on file  Social History Narrative  . Not on file   Additional Social History:    Pain Medications: see MAR Prescriptions: see MAR Over the Counter: see MAR History of alcohol / drug use?: Yes Name of Substance 1: THC 1 - Age of First Use: UTA 1 - Amount (size/oz): varies 1 - Frequency: "whenever I can get it" 1 - Duration: "years" 1 - Last Use / Amount: 10/20/2018    Sleep: Poor  Appetite:  Fair  Current Medications: Current Facility-Administered Medications  Medication Dose Route Frequency Provider Last Rate Last Dose  . acetaminophen (TYLENOL) tablet 650 mg  650 mg Oral Q6H PRN Antonieta Pert, MD   650 mg at 10/21/18 1711  . DULoxetine (CYMBALTA) DR capsule 90 mg  90 mg Oral Daily Antonieta Pert, MD   90 mg at 10/26/18 1010  . furosemide (LASIX) tablet 40 mg  40 mg Oral Daily Aldean Baker, NP   40 mg at 10/26/18 1009  . gabapentin (NEURONTIN) capsule 400 mg  400 mg Oral TID Antonieta Pert, MD   400 mg at 10/26/18 1010  . hydrOXYzine (ATARAX/VISTARIL) tablet 25 mg  25 mg Oral Q6H PRN Antonieta Pert, MD   25 mg at 10/25/18 2149  . nicotine (NICODERM CQ - dosed in mg/24 hours) patch 21 mg  21 mg Transdermal Daily Antonieta Pert, MD      . prazosin (MINIPRESS) capsule 1 mg  1 mg Oral QHS Antonieta Pert, MD   1 mg at 10/25/18 2149  . rivaroxaban (XARELTO) tablet 20 mg  20 mg Oral Daily Antonieta Pert, MD   20 mg at 10/26/18 1009  . traMADol (ULTRAM) tablet 50 mg  50 mg Oral Q6H PRN Antonieta Pert, MD   50 mg at 10/26/18 1010  . traZODone (DESYREL) tablet 100 mg  100 mg Oral QHS PRN Aldean Baker, NP   100 mg at  10/25/18 2149    Lab Results:  Results for orders placed or performed during the hospital encounter of 10/21/18 (from the past 48 hour(s))  Glucose, capillary     Status: Abnormal   Collection Time: 10/25/18  6:24 AM  Result Value Ref Range   Glucose-Capillary 102 (H) 70 - 99 mg/dL   Comment 1 Notify RN    Comment 2 Document in Chart   Basic metabolic panel     Status: Abnormal   Collection Time: 10/26/18  6:37 AM  Result Value Ref Range   Sodium 137 135 - 145 mmol/L   Potassium 3.9 3.5 - 5.1 mmol/L   Chloride 105 98 - 111 mmol/L   CO2 27 22 - 32 mmol/L   Glucose, Bld 103 (H) 70 - 99 mg/dL   BUN 12 6 - 20 mg/dL   Creatinine, Ser 3.50 0.44 - 1.00 mg/dL   Calcium 8.6 (L) 8.9 - 10.3 mg/dL   GFR calc non Af Amer >60 >60 mL/min   GFR calc Af Amer >60 >60 mL/min   Anion gap 5 5 - 15    Comment: Performed at [facility], [address], [place], [place] [address]  Glucose, capillary     Status: Abnormal   Collection Time: 10/26/18  6:52 AM  Result Value Ref Range   Glucose-Capillary 113 (H) 70 - 99 mg/dL    Blood Alcohol level:  Lab Results  Component Value Date   ETH <10 09/22/2018    Metabolic Disorder Labs: Lab Results  Component Value Date   HGBA1C 6.0 (H) 09/30/2018   MPG 125.5 09/30/2018   No results  found for: PROLACTIN Lab Results  Component Value Date   CHOL 195 09/30/2018   TRIG 105 09/30/2018   HDL 57 09/30/2018   CHOLHDL 3.4 09/30/2018   VLDL 21 09/30/2018   LDLCALC 117 (H) 09/30/2018    Physical Findings: AIMS: Facial and Oral Movements Muscles of Facial Expression: None, normal Lips and Perioral Area: None, normal Jaw: None, normal Tongue: None, normal,Extremity Movements Upper (arms, wrists, hands, fingers): None, normal Lower (legs, knees, ankles, toes): None, normal, Trunk Movements Neck, shoulders, hips: None, normal, Overall Severity Severity of abnormal movements (highest score from questions above): None, normal Incapacitation due to abnormal movements: None, normal Patient's awareness of abnormal movements (rate only patient's report): No Awareness, Dental Status Current problems with teeth and/or dentures?: No Does patient usually wear dentures?: No  CIWA:  CIWA-Ar Total: 0 COWS:  COWS Total Score: 0  Musculoskeletal: Strength & Muscle Tone: within normal limits Gait & Station: normal Patient leans: N/A  Psychiatric Specialty Exam: Physical Exam  Nursing note and vitals reviewed. Constitutional: She is oriented to person, place, and time. She appears well-developed and well-nourished.  Cardiovascular: Normal rate.  Respiratory: Effort normal.  Neurological: She is alert and oriented to person, place, and time.    Review of Systems  Constitutional: Negative.   Respiratory: Negative for shortness of breath.   Cardiovascular: Negative for chest pain and orthopnea.  Psychiatric/Behavioral: Positive for depression, substance abuse (UDS +THC, BZDs) and suicidal ideas. Negative for hallucinations and memory loss. The patient has insomnia. The patient is not nervous/anxious.     Blood pressure 113/74, pulse 92, temperature 99.3 F (37.4 C), temperature source Oral, resp. rate 16, height 5\' 6"  (1.676 m), weight 71 kg, SpO2 100 %.Body mass index is 25.26  kg/m.  General Appearance: Casual  Eye Contact:  Minimal  Speech:  Slow  Volume:  Normal  Mood:  Depressed  Affect:  Flat  Thought Process:  Coherent  Orientation:  Full (Time, Place, and Person)  Thought Content:  Rumination  Suicidal Thoughts:  Yes.  without intent/plan Contracts for safety on the unit.  Homicidal Thoughts:  No  Memory:  Immediate;   Fair Recent;   Fair  Judgement:  Fair  Insight:  Fair  Psychomotor Activity:  Decreased  Concentration:  Concentration: Fair  Recall:  [employer] of Knowledge:  Fair  Language:  Fair  Akathisia:  No  Handed:  Right  AIMS (if indicated):     Assets:  Communication Skills Leisure Time Resilience  ADL's:  Intact  Cognition:  WNL  Sleep:  Number of Hours: 6     Treatment Plan Summary: Daily contact with patient to assess and evaluate symptoms and progress in treatment and Medication management   Continue inpatient hospitalization.  One time dose of Lasix 20 mg now; increase Lasix to 40 mg PO daily for BLE edema Check BMP Increase trazodone to 100 mg PO QHS PRN insomnia Continue Cymbalta 90 mg PO daily for mood Continue gabapentin 400 mg PO TID for pain Continue Vistaril 25 mg PO Q6HR PRN anxiety Continue Minipress 1 mg PO QHS for nightmares Continue Xarelto 20 mg PO daily for anticoagulation Continue tramadol 50 mg PO Q6HR PRN pain  Patient will participate in the therapeutic group milieu.  Discharge disposition in progress.   Laveda Abbe, NP 10/26/2018, 12:23 PM   Agree with NP progress note

## 2018-10-26 NOTE — Progress Notes (Addendum)
Chaplain follow up with Christina [name]   Christina [name] presented with flat affect and expressed hopelessness.  Noted shame around return admission - stating she has always been "Strong" and able to manage and feels embarrassed that she is "not able to handle"   Christina [name] spoke with chaplain about her faith, which has been a resource for her.  She expresses disconnection and anger toward God.  She has held a faith which sees God as benevolent and holding control over history.  She struggles with providence - especially theodicy - wondering why God would continue to allow / manifest suffering in her life.  Chaplain provided space for her to express these feelings and normalized spiritual struggle.  In conversation, introduced other options for thinking of God's presence in suffering - including a limited God who engages suffering alongside [place]. Christina [name]'s image of God has been a strong resource for her in her past crises and I imagine it will be a significant challenge in her continued suffering to express feelings of abandonment / betrayal and reframe image of God that is more consistent with experience.    Christina [name] is concerned about housing at discharge - expressing hopelessness, SI, and inability to be future oriented in thinking about lack of housing plan.  States that if she discharges without a plan she feels is safe, she will not [facility] to [facility] - implying that she will end her life.  Wonders allowed whether she will go to heaven or hell and states that she is already living in hell.   Toward end of conversation, expressed hope that her SSD would be approved and she could afford a place to live.     She asked this chaplain to check into housing resources with denomination.  Chaplain directed her to Child psychotherapist as primary resource for discharge planning and noted that I would check with resources with which I was aware and consult with her Child psychotherapist.

## 2018-10-26 NOTE — Plan of Care (Signed)
Patient self inventory- Patient slept fair last night, sleep medication was helpful. Appetite is poor, energy level low, and concentration poor. Depression, hopelessness, and anxiety rated 9, 8, 9 out of 10. Endorses "pain in back down to feet" Patient said "kind of " when asked if suicidal. Patient's goal is "finding a home."  Patient is compliant with medications prescribed. No side effects noted. Safety is maintained with 15 minute checks as well as environmental checks. Will continue to monitor and provide support.  Problem: Education: Goal: Emotional status will improve Outcome: Progressing Goal: Mental status will improve Outcome: Progressing Goal: Verbalization of understanding the information provided will improve Outcome: Progressing   Problem: Activity: Goal: Interest or engagement in activities will improve Outcome: Progressing

## 2018-10-26 NOTE — Progress Notes (Signed)
D: Pt denies SI/HI/AVH. Pt is pleasant and cooperative. Pt visible in dayroom much of the evening, pt stated she had been coloring much of the day to help her get through her day. Pt feels hopeful about finding a place to go after D/C.   A: Pt was offered support and encouragement. Pt was given scheduled medications. Pt was encourage to attend groups. Q 15 minute checks were done for safety.   R:Pt attends groups and interacts well with peers and staff. Pt is taking medication. Pt receptive to treatment and safety maintained on unit.

## 2018-10-27 LAB — GLUCOSE, CAPILLARY: Glucose-Capillary: 168 mg/dL — ABNORMAL HIGH (ref 70–99)

## 2018-10-27 MED ORDER — TRAMADOL HCL 50 MG PO TABS
50.0000 mg | ORAL_TABLET | Freq: Two times a day (BID) | ORAL | Status: DC | PRN
  Administered 2018-10-27 – 2018-10-28 (×2): 50 mg via ORAL
  Filled 2018-10-27 (×2): qty 1

## 2018-10-27 NOTE — Tx Team (Signed)
Interdisciplinary Treatment and Diagnostic Plan Update  10/27/2018 Time of Session:  Christina [name] MRN: [id number]  Principal Diagnosis: MDD (major depressive disorder), single episode, severe , no psychosis (HCC)  Secondary Diagnoses: Principal Problem:   MDD (major depressive disorder), single episode, severe , no psychosis (HCC)   Current Medications:  Current Facility-Administered Medications  Medication Dose Route Frequency Provider Last Rate Last Dose  . acetaminophen (TYLENOL) tablet 650 mg  650 mg Oral Q6H PRN Antonieta Pert, MD   650 mg at 10/27/18 0817  . DULoxetine (CYMBALTA) DR capsule 90 mg  90 mg Oral Daily Antonieta Pert, MD   90 mg at 10/27/18 0818  . furosemide (LASIX) tablet 40 mg  40 mg Oral Daily Aldean Baker, NP   40 mg at 10/26/18 1009  . gabapentin (NEURONTIN) capsule 400 mg  400 mg Oral TID Antonieta Pert, MD   400 mg at 10/27/18 1212  . hydrOXYzine (ATARAX/VISTARIL) tablet 25 mg  25 mg Oral Q6H PRN Antonieta Pert, MD   25 mg at 10/26/18 2148  . nicotine (NICODERM CQ - dosed in mg/24 hours) patch 21 mg  21 mg Transdermal Daily Antonieta Pert, MD      . prazosin (MINIPRESS) capsule 1 mg  1 mg Oral QHS Antonieta Pert, MD   1 mg at 10/26/18 2148  . rivaroxaban (XARELTO) tablet 20 mg  20 mg Oral Daily Antonieta Pert, MD   20 mg at 10/27/18 0817  . traMADol (ULTRAM) tablet 50 mg  50 mg Oral Q12H PRN Cobos, Rockey Situ, MD      . traZODone (DESYREL) tablet 100 mg  100 mg Oral QHS PRN Aldean Baker, NP   100 mg at 10/26/18 2148   PTA Medications: Medications Prior to Admission  Medication Sig Dispense Refill Last Dose  . acetaminophen (TYLENOL) 325 MG tablet Take 2 tablets (650 mg total) by mouth every 6 (six) hours as needed for mild pain. (May buy over the counter) 1 tablet 0   . DULoxetine (CYMBALTA) 60 MG capsule Take 1 capsule (60 mg total) by mouth daily. For mood 30 capsule 0   . furosemide (LASIX) 20 MG tablet Take 1 tablet (20 mg  total) by mouth daily. For swelling 15 tablet 0   . gabapentin (NEURONTIN) 300 MG capsule Take 1 capsule (300 mg total) by mouth 3 (three) times daily. For pain 90 capsule 0   . hydrOXYzine (ATARAX/VISTARIL) 25 MG tablet Take 1 tablet (25 mg total) by mouth every 6 (six) hours as needed for anxiety. 60 tablet 0   . ibuprofen (ADVIL,MOTRIN) 200 MG tablet Take 600 mg by mouth daily as needed for moderate pain.   Past Week at Unknown time  . lidocaine (LIDODERM) 5 % Place 1 patch onto the skin daily as needed. Remove & Discard patch within 12 hours or as directed by MD for pain 30 patch 0   . lurasidone (LATUDA) 20 MG TABS tablet Take 1 tablet (20 mg total) by mouth daily with breakfast. For mood 30 tablet 0   . nicotine (NICODERM CQ - DOSED IN MG/24 HOURS) 21 mg/24hr patch Place 1 patch (21 mg total) onto the skin daily. For smoking cessation 28 patch 0   . oseltamivir (TAMIFLU) 75 MG capsule Take 1 capsule (75 mg total) by mouth daily. For flu prevention 4 capsule 0   . pantoprazole (PROTONIX) 40 MG tablet Take 1 tablet (40 mg total) by mouth daily. For acid  reflux 30 tablet 0   . prazosin (MINIPRESS) 1 MG capsule Take 1 capsule (1 mg total) by mouth at bedtime. For nightmares 30 capsule 0   . rivaroxaban (XARELTO) 20 MG TABS tablet Take 1 tablet (20 mg total) by mouth daily with supper. For DVT prevention 20 tablet 0   . traMADol (ULTRAM) 50 MG tablet Take 1 tablet (50 mg total) by mouth every 12 (twelve) hours as needed for moderate pain. 15 tablet 0   . traZODone (DESYREL) 50 MG tablet Take 1 tablet (50 mg total) by mouth at bedtime as needed for sleep. 30 tablet 0     Patient Stressors: Financial difficulties Medication change or noncompliance  Patient Strengths: Ability for insight Average or above average intelligence Capable of independent living Wellsite geologist fund of knowledge Motivation for treatment/growth  Treatment Modalities: Medication Management, Group therapy,  Case management,  1 to 1 session with clinician, Psychoeducation, Recreational therapy.   Physician Treatment Plan for Primary Diagnosis: MDD (major depressive disorder), single episode, severe , no psychosis (HCC) Long Term Goal(s): Improvement in symptoms so as ready for discharge Improvement in symptoms so as ready for discharge   Short Term Goals: Ability to identify changes in lifestyle to reduce recurrence of condition will improve Ability to verbalize feelings will improve Ability to disclose and discuss suicidal ideas Ability to demonstrate self-control will improve Ability to identify and develop effective coping behaviors will improve Ability to maintain clinical measurements within normal limits will improve Compliance with prescribed medications will improve Ability to identify changes in lifestyle to reduce recurrence of condition will improve Ability to verbalize feelings will improve Ability to disclose and discuss suicidal ideas Ability to demonstrate self-control will improve Ability to identify and develop effective coping behaviors will improve Ability to maintain clinical measurements within normal limits will improve Compliance with prescribed medications will improve  Medication Management: Evaluate patient's response, side effects, and tolerance of medication regimen.  Therapeutic Interventions: 1 to 1 sessions, Unit Group sessions and Medication administration.  Evaluation of Outcomes: Progressing  Physician Treatment Plan for Secondary Diagnosis: Principal Problem:   MDD (major depressive disorder), single episode, severe , no psychosis (HCC)  Long Term Goal(s): Improvement in symptoms so as ready for discharge Improvement in symptoms so as ready for discharge   Short Term Goals: Ability to identify changes in lifestyle to reduce recurrence of condition will improve Ability to verbalize feelings will improve Ability to disclose and discuss suicidal  ideas Ability to demonstrate self-control will improve Ability to identify and develop effective coping behaviors will improve Ability to maintain clinical measurements within normal limits will improve Compliance with prescribed medications will improve Ability to identify changes in lifestyle to reduce recurrence of condition will improve Ability to verbalize feelings will improve Ability to disclose and discuss suicidal ideas Ability to demonstrate self-control will improve Ability to identify and develop effective coping behaviors will improve Ability to maintain clinical measurements within normal limits will improve Compliance with prescribed medications will improve     Medication Management: Evaluate patient's response, side effects, and tolerance of medication regimen.  Therapeutic Interventions: 1 to 1 sessions, Unit Group sessions and Medication administration.  Evaluation of Outcomes: Progressing   RN Treatment Plan for Primary Diagnosis: MDD (major depressive disorder), single episode, severe , no psychosis (HCC) Long Term Goal(s): Knowledge of disease and therapeutic regimen to maintain health will improve  Short Term Goals: Ability to participate in decision making will improve, Ability to verbalize feelings will  improve, Ability to disclose and discuss suicidal ideas, Ability to identify and develop effective coping behaviors will improve and Compliance with prescribed medications will improve  Medication Management: RN will administer medications as ordered by provider, will assess and evaluate patient's response and provide education to patient for prescribed medication. RN will report any adverse and/or side effects to prescribing provider.  Therapeutic Interventions: 1 on 1 counseling sessions, Psychoeducation, Medication administration, Evaluate responses to treatment, Monitor vital signs and CBGs as ordered, Perform/monitor CIWA, COWS, AIMS and Fall Risk screenings as  ordered, Perform wound care treatments as ordered.  Evaluation of Outcomes: Progressing   LCSW Treatment Plan for Primary Diagnosis: MDD (major depressive disorder), single episode, severe , no psychosis (HCC) Long Term Goal(s): Safe transition to appropriate next level of care at discharge, Engage patient in therapeutic group addressing interpersonal concerns.  Short Term Goals: Engage patient in aftercare planning with referrals and resources  Therapeutic Interventions: Assess for all discharge needs, 1 to 1 time with Social worker, Explore available resources and support systems, Assess for adequacy in community support network, Educate family and significant other(s) on suicide prevention, Complete Psychosocial Assessment, Interpersonal group therapy.  Evaluation of Outcomes: Adequate for Discharge   Progress in Treatment: Attending groups: Yes.   Participating in groups: Yes. Taking medication as prescribed: No. Toleration medication: Yes. Family/Significant other contact made: No, will contact:  patient declined consent for collateral contacts Patient understands diagnosis: Yes. Discussing patient identified problems/goals with staff: Yes. Medical problems stabilized or resolved: Yes. Denies suicidal/homicidal ideation: No. Passive SI Issues/concerns per patient self-inventory: No. Other:   New problem(s) identified: None   New Short Term/Long Term Goal(s): medication stabilization, elimination of SI thoughts, development of comprehensive mental wellness plan.   Patient Goals:  Get back on my medicine while I'm here  Discharge Plan or Barriers: Patient reports she is currently homeless with no income and no supports. Patient recently discharged from [facility] two weeks ago with follow up recommendations at [facility] for outpatient medication management and therapy services. CSW will continue to assess and follow for appropriate referrals.   Reason for Continuation of  Hospitalization: Depression Medication stabilization Suicidal ideation  Estimated Length of Stay: 10/29/2018  Attendees: Patient: 10/27/2018 2:04 PM  Physician: Dr. Landry Mellow, MD; Nehemiah Massed, MD 10/27/2018 2:04 PM  Nursing: Arlyss Repress.J, RN; Ethelene Browns. A, RN 10/27/2018 2:04 PM  RN Care Manager: 10/27/2018 2:04 PM  Social Worker: Baldo Daub, LCSWA 10/27/2018 2:04 PM  Recreational Therapist:  10/27/2018 2:04 PM  Other: Marciano Sequin, NP  10/27/2018 2:04 PM  Other:  10/27/2018 2:04 PM  Other: 10/27/2018 2:04 PM    Scribe for Treatment Team: Maeola Sarah, LCSWA 10/27/2018 2:04 PM

## 2018-10-27 NOTE — Plan of Care (Signed)
  Problem: Activity: Goal: Interest or engagement in activities will improve Outcome: Progressing   D: Pt alert and oriented on the unit. Pt engaging with RN staff and other pts. Pt denies SI/HI, A/VH. Pt's affect was flat and mood depressed and stated that the most important thing for her to work on today is "The pain in my body and heart." Pt participated during unit groups and activities and is pleasant and cooperative. A: Education, support and encouragement provided, q15 minute safety checks remain in effect. Medications administered per MD orders. R: No reactions/side effects to medicine noted. Pt denies any concerns at this time, and verbally contracts for safety. Pt ambulating on the unit with no issues. Pt remains safe on and off the unit.

## 2018-10-27 NOTE — [facility] Group Notes (Signed)
Occupational Therapy Group Note  Date:  10/27/2018 Time:  11:27 AM  Group Topic/Focus:  Stress Management  Participation Level:  Active  Participation Quality:  Appropriate  Affect:  Flat  Cognitive:  Appropriate  Insight: Improving  Engagement in Group:  Engaged  Modes of Intervention:  Activity, Discussion, Education and Socialization  Additional Comments:    S: "I have been using deep breathing and picturing my happy place when I am stressed"  O: Stress management group completed to use as productive coping strategy, to help mitigate maladaptive coping to integrate in functional BADL/IADL. Stress management tool worksheet discussed to educate on unhealthy vs healthy coping skills to manage stress to improve community integration. Coping strategies taught include: relaxation based- deep breathing, counting to 10, taking a 1 minute vacation, acceptance, stress balls, relaxation audio/video, visual/mental imagery. Positive mental attitude- gratitude, acceptance, cognitive reframing, positive self talk, anger management. Coping skills bingo played with education given on variety of coping skills between bingo calls. Pts encouraged to share experience with various coping skills and share what has worked for them with others. Coloring and relaxation guide handouts given at the end of the session.   A: Pt presents with flat affect, engaged and participatory throughout entirety of session. Pt in understanding of education given. She shares the music, breathing, and "I am affirmations" she uses to manage stress. She shares that often time she will suppress her stress when she is depressed.  P: OT treatment will be x1 per week while pt inpatient.  Dalphine Handing, MSOT, OTR/L Behavioral Health OT/ Acute Relief OT PHP Office: [phone]  Dalphine Handing 10/27/2018, 11:27 AM

## 2018-10-27 NOTE — Progress Notes (Signed)
[facility] MD Progress Note  10/27/2018 12:56 PM Tyeisha Dinan  MRN:  [id number] Subjective: Patient reports persistent depression, which she attributes mainly to psychosocial stressors/unstable housing.  Today denies suicidal plan or intention and presents future oriented, stating that she is interested in applying/getting disability and hopeful that when she does her housing circumstances will improve.  She was also interested in potential disposition planning options.  Describes improving sleep, "so-so" appetite. Does not currently endorse medication side effects. Objective: I have discussed case with treatment team and have met with patient.  48 year old female, history of recent admission to [facility] ( 1/29-2/12) for depression, PTSD symptoms.  After discharge she reported worsening depression and reemergence of suicidal ideations, in the context of poor/unstable living environment, suboptimal medication compliance. Today continues to describe some depression but acknowledges partial improvement compared to how she felt at admission.  At this time does not endorse suicidal plans/intentions and is future oriented, planning on applying for disability and looking into disposition options.  Does not currently endorse medication side effects.  No disruptive or agitated behaviors on unit, polite on approach. Some group participation, becoming more interactive with peers.  3/3 labs reviewed-BMP unremarkable.  CBG today 168    Principal Problem: MDD (major depressive disorder), single episode, severe , no psychosis ([place]) Diagnosis: Principal Problem:   MDD (major depressive disorder), single episode, severe , no psychosis ([place])  Total Time spent with patient: 20 minutes  Past Psychiatric History: See admission H&P  Past Medical History:  Past Medical History:  Diagnosis Date  . Anxiety   . Back pain   . DVT (deep venous thrombosis) (HCC)    left leg with stent    Past Surgical History:  Procedure  Laterality Date  . INTRAVASCULAR ULTRASOUND/IVUS Left 11/03/2016   Procedure: Intravascular Ultrasound/IVUS;  Surgeon: Waynetta Sandy, MD;  Location: [place] [facility];  Service: Cardiovascular;  Laterality: Left;  . LOWER EXTREMITY VENOGRAPHY Left 11/04/2016   Procedure: Lower Extremity Venography;  Surgeon: Serafina Mitchell, MD;  Location: [place] [facility];  Service: Cardiovascular;  Laterality: Left;  . PERIPHERAL VASCULAR INTERVENTION Left 11/04/2016   Procedure: Peripheral Vascular Intervention;  Surgeon: Serafina Mitchell, MD;  Location: [place] [facility];  Service: Cardiovascular;  Laterality: Left;  common external  . PERIPHERAL VASCULAR THROMBECTOMY Left 11/03/2016   Procedure: Peripheral Vascular Thrombectomy;  Surgeon: Waynetta Sandy, MD;  Location: [place] [facility];  Service: Cardiovascular;  Laterality: Left;   Family History: History reviewed. No pertinent family history. Family Psychiatric  History: See admission H&P Social History:  Social History   Substance and Sexual Activity  Alcohol Use Yes     Social History   Substance and Sexual Activity  Drug Use Yes  . Types: Marijuana    Social History   Socioeconomic History  . Marital status: Divorced    Spouse name: Not on file  . Number of children: Not on file  . Years of education: Not on file  . Highest education level: Not on file  Occupational History  . Not on file  Social Needs  . Financial resource strain: Very hard  . Food insecurity:    Worry: Sometimes true    Inability: Sometimes true  . Transportation needs:    Medical: No    Non-medical: No  Tobacco Use  . Smoking status: Current Every Day Smoker    Packs/day: 0.25    Types: Cigarettes  . Smokeless tobacco: Never Used  Substance  and Sexual Activity  . Alcohol use: Yes  . Drug use: Yes    Types: Marijuana  . Sexual activity: Yes    Birth control/protection: Other-see comments    Comment: Tubal ligation  Lifestyle  .  Physical activity:    Days per week: 2 days    Minutes per session: 10 min  . Stress: Very much  Relationships  . Social connections:    Talks on phone: Three times a week    Gets together: More than three times a week    Attends religious service: Never    Active member of club or organization: No    Attends meetings of clubs or organizations: Never    Relationship status: Divorced  Other Topics Concern  . Not on file  Social History Narrative  . Not on file   Additional Social History:    Pain Medications: see MAR Prescriptions: see MAR Over the Counter: see MAR History of alcohol / drug use?: Yes Name of Substance 1: THC 1 - Age of First Use: UTA 1 - Amount (size/oz): varies 1 - Frequency: "whenever I can get it" 1 - Duration: "years" 1 - Last Use / Amount: 10/20/2018    Sleep: Improving  Appetite:  Fair/improving  Current Medications: Current Facility-Administered Medications  Medication Dose Route Frequency Provider Last Rate Last Dose  . acetaminophen (TYLENOL) tablet 650 mg  650 mg Oral Q6H PRN Sharma Covert, MD   650 mg at 10/27/18 0817  . DULoxetine (CYMBALTA) DR capsule 90 mg  90 mg Oral Daily Sharma Covert, MD   90 mg at 10/27/18 0818  . furosemide (LASIX) tablet 40 mg  40 mg Oral Daily Connye Burkitt, NP   40 mg at 10/26/18 1009  . gabapentin (NEURONTIN) capsule 400 mg  400 mg Oral TID Sharma Covert, MD   400 mg at 10/27/18 1212  . hydrOXYzine (ATARAX/VISTARIL) tablet 25 mg  25 mg Oral Q6H PRN Sharma Covert, MD   25 mg at 10/26/18 2148  . nicotine (NICODERM CQ - dosed in mg/24 hours) patch 21 mg  21 mg Transdermal Daily Sharma Covert, MD      . prazosin (MINIPRESS) capsule 1 mg  1 mg Oral QHS Sharma Covert, MD   1 mg at 10/26/18 2148  . rivaroxaban (XARELTO) tablet 20 mg  20 mg Oral Daily Sharma Covert, MD   20 mg at 10/27/18 0817  . traMADol (ULTRAM) tablet 50 mg  50 mg Oral Q6H PRN Sharma Covert, MD   50 mg at 10/27/18  8937  . traZODone (DESYREL) tablet 100 mg  100 mg Oral QHS PRN Connye Burkitt, NP   100 mg at 10/26/18 2148    Lab Results:  Results for orders placed or performed during the hospital encounter of 10/21/18 (from the past 48 hour(s))  Basic metabolic panel     Status: Abnormal   Collection Time: 10/26/18  6:37 AM  Result Value Ref Range   Sodium 137 135 - 145 mmol/L   Potassium 3.9 3.5 - 5.1 mmol/L   Chloride 105 98 - 111 mmol/L   CO2 27 22 - 32 mmol/L   Glucose, Bld 103 (H) 70 - 99 mg/dL   BUN 12 6 - 20 mg/dL   Creatinine, Ser 0.71 0.44 - 1.00 mg/dL   Calcium 8.6 (L) 8.9 - 10.3 mg/dL   GFR calc non Af Amer >60 >60 mL/min   GFR calc Af Amer >60 >  60 mL/min   Anion gap 5 5 - 15    Comment: Performed at [facility], [place] [address], [place], [place] [address]  Glucose, capillary     Status: Abnormal   Collection Time: 10/26/18  6:52 AM  Result Value Ref Range   Glucose-Capillary 113 (H) 70 - 99 mg/dL  Glucose, capillary     Status: Abnormal   Collection Time: 10/27/18  6:48 AM  Result Value Ref Range   Glucose-Capillary 168 (H) 70 - 99 mg/dL    Blood Alcohol level:  Lab Results  Component Value Date   ETH <10 55/73/2202    Metabolic Disorder Labs: Lab Results  Component Value Date   HGBA1C 6.0 (H) 09/30/2018   MPG 125.5 09/30/2018   No results found for: PROLACTIN Lab Results  Component Value Date   CHOL 195 09/30/2018   TRIG 105 09/30/2018   HDL 57 09/30/2018   CHOLHDL 3.4 09/30/2018   VLDL 21 09/30/2018   LDLCALC 117 (H) 09/30/2018    Physical Findings: AIMS: Facial and Oral Movements Muscles of Facial Expression: None, normal Lips and Perioral Area: None, normal Jaw: None, normal Tongue: None, normal,Extremity Movements Upper (arms, wrists, hands, fingers): None, normal Lower (legs, knees, ankles, toes): None, normal, Trunk Movements Neck, shoulders, hips: None, normal, Overall Severity Severity of abnormal movements (highest score from  questions above): None, normal Incapacitation due to abnormal movements: None, normal Patient's awareness of abnormal movements (rate only patient's report): No Awareness, Dental Status Current problems with teeth and/or dentures?: No Does patient usually wear dentures?: No  CIWA:  CIWA-Ar Total: 0 COWS:  COWS Total Score: 0  Musculoskeletal: Strength & Muscle Tone: within normal limits Gait & Station: normal Patient leans: N/A  Psychiatric Specialty Exam: Physical Exam  Nursing note and vitals reviewed. Constitutional: She is oriented to person, place, and time. She appears well-developed and well-nourished.  Cardiovascular: Normal rate.  Respiratory: Effort normal.  Neurological: She is alert and oriented to person, place, and time.    Review of Systems  Constitutional: Negative.   Respiratory: Negative for shortness of breath.   Cardiovascular: Negative for chest pain and orthopnea.  Psychiatric/Behavioral: Positive for depression, substance abuse (UDS +THC, BZDs) and suicidal ideas. Negative for hallucinations and memory loss. The patient has insomnia. The patient is not nervous/anxious.   Currently does not endorse chest pain or shortness of breath-reports chronic back pain  Blood pressure 110/78, pulse (!) 108, temperature 98.1 F (36.7 C), temperature source Oral, resp. rate 16, height 5' 6"  (1.676 m), weight 71 kg, SpO2 100 %.Body mass index is 25.26 kg/m.  General Appearance: Improving grooming  Eye Contact:  Fair-improves during session  Speech:  Normal Rate  Volume:  Normal  Mood:  Reports persistent depression which she attributes to psychosocial stressors but acknowledges some improvement compared to admission  Affect:  More reactive, smiles at times appropriately  Thought Process:  Linear and Descriptions of Associations: Intact  Orientation:  Other:  Fully alert and attentive  Thought Content:  No hallucinations, no delusions expressed, focused on stressors   Suicidal Thoughts:  No today does not endorse suicidal ideations, presents future oriented, contracts for safety on unit, denies homicidal or violent ideations  Homicidal Thoughts:  No  Memory:  Recent and remote grossly intact  Judgement:  Other:  Fair/improving  Insight:  Fair and Improving  Psychomotor Activity:  Normal  Concentration:  Concentration: Good and Attention Span: Good  Recall:  Good  Fund of Knowledge:  Good  Language:  Good  Akathisia:  No  Handed:  Right  AIMS (if indicated):     Assets:  Communication Skills Leisure Time Resilience  ADL's:  Intact  Cognition:  WNL  Sleep:  Number of Hours: 6.25   Assessment:  48 year old female, history of recent admission to [facility] ( 1/29-2/12) for depression, PTSD symptoms.  After discharge she reported worsening depression and reemergence of suicidal ideations, in the context of poor/unstable living environment, suboptimal medication compliance.  Today patient describes persistent depression and anxiety related to her psychosocial stressors, mainly described as unstable housing, financial difficulties, limited support network.  She does acknowledge some improvement compared to admission and is currently presenting future oriented and focused on disposition planning options.  Today denies any suicidal plan or intention and contracts for safety on unit.  Thus far tolerating medication regimen well.  Sleep improving. Treatment Plan Summary: Daily contact with patient to assess and evaluate symptoms and progress in treatment and Medication management  Treatment plan reviewed as below today 3/4 Encourage group and milieu participation to work on coping skills and symptom reduction Continue inpatient hospitalization. Continue Lasix  40 mg PO daily for BLE edema Continue Trazodone 100 mg PO QHS PRN insomnia Continue Cymbalta 90 mg PO daily for depression, anxiety, pain Continue Gabapentin 400 mg PO TID for pain, anxiety Continue  Vistaril 25 mg PO Q6HR PRN anxiety Continue Minipress 1 mg PO QHS for nightmares Continue Xarelto 20 mg PO daily for anticoagulation Decrease Tramadol 50 mg PO Q12HR PRN pain Treatment team working on disposition planning options Check EKG to monitor QTc Jenne Campus, MD 10/27/2018, 12:56 PM   Patient ID: Winfield Cunas, female   DOB: [birth year], 48 y.o.   MRN: [id number]

## 2018-10-28 LAB — GLUCOSE, CAPILLARY: Glucose-Capillary: 112 mg/dL — ABNORMAL HIGH (ref 70–99)

## 2018-10-28 MED ORDER — TRAZODONE HCL 100 MG PO TABS: 100.0000 mg | ORAL_TABLET | Freq: Every evening | ORAL | 0 refills | Status: DC | PRN

## 2018-10-28 MED ORDER — GABAPENTIN 400 MG PO CAPS: 400.0000 mg | ORAL_CAPSULE | Freq: Three times a day (TID) | ORAL | 0 refills | Status: DC

## 2018-10-28 MED ORDER — TRAMADOL HCL 50 MG PO TABS: 50.0000 mg | ORAL_TABLET | Freq: Two times a day (BID) | ORAL | 0 refills | Status: DC | PRN

## 2018-10-28 MED ORDER — NICOTINE 21 MG/24HR TD PT24: 21.0000 mg | MEDICATED_PATCH | Freq: Every day | TRANSDERMAL | 0 refills | Status: DC

## 2018-10-28 MED ORDER — FUROSEMIDE 40 MG PO TABS: 40.0000 mg | ORAL_TABLET | Freq: Every day | ORAL | 0 refills | Status: DC

## 2018-10-28 MED ORDER — RIVAROXABAN 20 MG PO TABS: 20.0000 mg | ORAL_TABLET | Freq: Every day | ORAL | 0 refills | Status: DC

## 2018-10-28 MED ORDER — PRAZOSIN HCL 1 MG PO CAPS: 1.0000 mg | ORAL_CAPSULE | Freq: Every day | ORAL | 0 refills | Status: DC

## 2018-10-28 MED ORDER — HYDROXYZINE HCL 25 MG PO TABS: 25.0000 mg | ORAL_TABLET | Freq: Four times a day (QID) | ORAL | 0 refills | Status: DC | PRN

## 2018-10-28 MED ORDER — DULOXETINE HCL 30 MG PO CPEP
90.0000 mg | ORAL_CAPSULE | Freq: Every day | ORAL | Status: DC
  Filled 2018-10-28 (×2): qty 21

## 2018-10-28 MED ORDER — ACETAMINOPHEN 325 MG PO TABS: 650.0000 mg | ORAL_TABLET | Freq: Four times a day (QID) | ORAL | Status: DC | PRN

## 2018-10-28 MED ORDER — DULOXETINE HCL 30 MG PO CPEP: 90.0000 mg | ORAL_CAPSULE | Freq: Every day | ORAL | 0 refills | Status: DC

## 2018-10-28 NOTE — Plan of Care (Signed)
D: Patient is in the dayroom on approach and interacts in the dayroom for most of the evening. Patient is alert, oriented, pleasant, and cooperative. Endorses passive SI. Denies HI, AVH, and verbally contracts for safety. Mood/affect is flat/depressed during assessment. Patient reports she slept well last night with sleeping medication. Patient reports chronic back pain 9/10. Patient reports she "feel the same, not any better since admission" and rates her day 5/10.    A: Scheduled medications administered per MD order. Support provided. Patient educated on safety on the unit and medications. Routine safety checks every 15 minutes. Patient stated understanding to tell nurse about any new physical symptoms. Patient understands to tell staff of any needs.     R: No adverse drug reactions noted. Patient verbally contracts for safety. Patient remains safe at this time and will continue to monitor.   Problem: Activity: Goal: Interest or engagement in activities will improve Outcome: Progressing   Problem: Safety: Goal: Periods of time without injury will increase Outcome: Progressing  Patient enjoys interacting in the milieu. Patient remains safe and will continue to monitor.

## 2018-10-28 NOTE — [facility] Suicide Risk Assessment (Addendum)
[facility] Discharge Suicide Risk Assessment   Principal Problem: MDD (major depressive disorder), single episode, severe , no psychosis (HCC) Discharge Diagnoses: Principal Problem:   MDD (major depressive disorder), single episode, severe , no psychosis (HCC)   Total Time spent with patient: 30 minutes  Musculoskeletal: Strength & Muscle Tone: within normal limits Gait & Station: normal Patient leans: N/A  Psychiatric Specialty Exam: ROS no chest pain, no shortness of breath, chronic back pain, no vomiting, no fever, no chills  Blood pressure 111/76, pulse 90, temperature 98.1 F (36.7 C), temperature source Oral, resp. rate 16, height 5\' 6"  (1.676 m), weight 71 kg, SpO2 100 %.Body mass index is 25.26 kg/m.  General Appearance: Well Groomed  Eye Contact::  Good  Speech:  Normal Rate409  Volume:  Normal  Mood:  Partially improved mood  Affect:  Less constricted, smiles at times appropriately  Thought Process:  Linear and Descriptions of Associations: Intact  Orientation:  Full (Time, Place, and Person)  Thought Content:  No hallucinations, no delusions  Suicidal Thoughts:  No currently denies suicidal ideations, and presents future oriented  Homicidal Thoughts:  No denies homicidal plans or intentions  Memory:  Recent and remote grossly intact  Judgement:  Other:  Improving  Insight:  Fair/improving  Psychomotor Activity:  Normal  Concentration:  Good  Recall:  Good  Fund of Knowledge:Good  Language: Good  Akathisia:  Negative  Handed:  Right  AIMS (if indicated):     Assets:  Communication Skills Desire for Improvement Resilience  Sleep:  Number of Hours: 6  Cognition: WNL  ADL's:  Intact   Mental Status Per Nursing Assessment::   On Admission:  Suicidal ideation indicated by patient, Self-harm thoughts  Demographic Factors:  48 year old female, currently homeless  Loss Factors: Unstable housing/homelessness, financial stressors  Historical Factors: History of  depression, recent psychiatric admission at [facility]   Risk Reduction Factors:   Positive coping skills or problem solving skills  Continued Clinical Symptoms:  She is alert, attentive, calm, behavior in good control, reports she is feeling better today, and mood presents partially improved, affect less constricted, becoming more reactive, no thought disorder, at this time denying suicidal plan or intention and presenting future oriented, stating that she plans to return to live with her niece following discharge, but may opt to go to [facility] in the near future if "it does not work out".  Also states she is working on Financial [employer].  No disruptive or agitated behaviors on unit.  Currently does not endorse medication side effects.    Cognitive Features That Contribute To Risk:  No gross cognitive deficits noted upon discharge. Is alert , attentive, and oriented x 3   Suicide Risk:  Mild:  Suicidal ideation of limited frequency, intensity, duration, and specificity.  There are no identifiable plans, no associated intent, mild dysphoria and related symptoms, good self-control (both objective and subjective assessment), few other risk factors, and identifiable protective factors, including available and accessible social support.  Follow-up Information    [facility] Follow up on 11/02/2018.   Why:  Hospital follow up appointment is Tuesday, 3/10 at 8:00a. Please bring your current medications and discharge paperwork from this hospitalization.  Contact information: [address] [place] [address] [phone]        [facility] Follow up.   Why:  Primary care clinic for uninsured. Follow up for management of chronic pain and history of DVT. Please call to schedule appointment.  Contact information: [address] [place] [address] [phone]          Plan Of Care/Follow-up recommendations:  Activity:  as  tolerated  Diet:  regular Tests:  NA Other:  See below  Patient expressing readiness for discharge Patient plans to return to live with her niece.  Follow-up as above,   Craige Cotta, MD 10/28/2018, 11:10 AM

## 2018-10-28 NOTE — [facility] Group Notes (Signed)
[facility] Group Therapy      10/28/2018 2:00 PM  Type of Therapy: Mental Health [employer] Presentation  Participation Level: Active  Participation Quality: Attentive  Affect: Appropriate  Cognitive: Oriented  Insight: Developing/Improving  Engagement in Therapy: Engaged  Modes of Intervention: Discussion, Education and Socialization  Summary of Progress/Problems: Mental Health Association (MHA) Speaker came to talk about his personal journey with mental health. The pt processed ways by which to relate to the speaker. MHA speaker provided handouts and educational information pertaining to groups and services offered by the MHA. Pt was engaged in speaker's presentation and was receptive to resources provided.    Hanny Elsberry LCSWA Clinical Social Worker   

## 2018-10-28 NOTE — Progress Notes (Signed)
D: Pt A & O X 4. Pt presents with flat affect and irritable mood on initial contact. More animated and pleasant this afternoon. Reports poor sleep last night due to back pain, good appetite, low energy and good concentration level. Denies SI, HI, AVH and pain at this time. D/C home as ordered. Picked up in lobby by "my friend".  A: D/C instructions reviewed with pt including prescriptions (Faxed to pt's pharmacy per her request), medication samples and follow up appointments; compliance encouraged. All belongings from locker # 1 given to pt at time of departure. Scheduled and PRN medications given with verbal education and effects monitored. Safety checks maintained without incident till time of d/c.  R: Pt receptive to care. Compliant with medications when offered. Denies adverse drug reactions when assessed. Verbalized understanding related to d/c instructions. Signed belonging sheet in agreement with items received from locker. Ambulatory with a steady gait. Appears to be in no physical distress at time of departure.

## 2018-10-28 NOTE — Progress Notes (Signed)
  [facility] Adult Case Management Discharge Plan :  Will you be returning to the same living situation after discharge:  Yes,  patient reports she is returning home with her neice At discharge, do you have transportation home?: Yes,  patient reports a friend is picking her up at discharge Do you have the ability to pay for your medications: No.  Release of information consent forms completed and in the chart;  Patient's signature needed at discharge.  Patient to Follow up at: Follow-up Information    [facility] Follow up on 11/02/2018.   Why:  Hospital follow up appointment is Tuesday, 3/10 at 8:00a. Please bring your current medications and discharge paperwork from this hospitalization.  Contact information: [address] [place] [address] [phone]        [facility] Follow up.   Why:  Primary care clinic for uninsured. Follow up for management of chronic pain and history of DVT. Please call to schedule appointment. Contact information: [address] [place] [address] [phone]          Next level of care provider has access to [facility] Link:yes  Safety Planning and Suicide Prevention discussed: Yes,  with the patient  Have you used any form of tobacco in the last 30 days? (Cigarettes, Smokeless Tobacco, Cigars, and/or Pipes): Yes  Has patient been referred to the Quitline?: Patient refused referral  Patient has been referred for addiction treatment: N/A  Maeola Sarah, LCSWA 10/28/2018, 10:47 AM

## 2018-10-28 NOTE — Discharge Summary (Addendum)
Physician Discharge Summary Note  Patient:  Christina [name] is an 48 y.o., female MRN:  [id number] DOB:  25-Jul-1971 Patient phone:  [phone] (home)  Patient address:   [place] [address],  Total Time spent with patient: 15 minutes  Date of Admission:  10/21/2018 Date of Discharge: 10/28/2018  Reason for Admission:  Suicidal ideation  Principal Problem: MDD (major depressive disorder), single episode, severe , no psychosis (HCC) Discharge Diagnoses: Principal Problem:   MDD (major depressive disorder), single episode, severe , no psychosis (HCC)   Past Psychiatric History: Per admission H&P: Patient had a previous psychiatric admission here approximately 2 weeks ago.  She had been followed as an outpatient at the [facility] behavioral health clinic before she lost her insurance.  She has been treated in the past with several medications including Depakote, Latuda, gabapentin, Topamax, Risperdal, Cymbalta, gabapentin, hydroxyzine, prazosin and trazodone.  Past Medical History:  Past Medical History:  Diagnosis Date  . Anxiety   . Back pain   . 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 INTRAVASCULAR ULTRASOUND/IVUS Left 11/03/2016   Procedure: Intravascular Ultrasound/IVUS;  Surgeon: Christina Harman, MD;  Location: [facility] INVASIVE CV LAB;  Service: Cardiovascular;  Laterality: Left;  . LOWER EXTREMITY VENOGRAPHY Left 11/04/2016   Procedure: Lower Extremity Venography;  Surgeon: Christina Libman, MD;  Location: [facility] INVASIVE CV LAB;  Service: Cardiovascular;  Laterality: Left;  . PERIPHERAL VASCULAR INTERVENTION Left 11/04/2016   Procedure: Peripheral Vascular Intervention;  Surgeon: Christina Libman, MD;  Location: [facility] INVASIVE CV LAB;  Service: Cardiovascular;  Laterality: Left;  common external  . PERIPHERAL VASCULAR THROMBECTOMY Left 11/03/2016   Procedure: Peripheral Vascular Thrombectomy;  Surgeon:  Christina Harman, MD;  Location: [facility] INVASIVE CV LAB;  Service: Cardiovascular;  Laterality: Left;   Family History: History reviewed. No pertinent family history. Family Psychiatric  History: Per admission H&P: History reviewed.  No pertinent family history. Social History:  Social History   Substance and Sexual Activity  Alcohol Use Yes     Social History   Substance and Sexual Activity  Drug Use Yes  . Types: Marijuana    Social History   Socioeconomic History  . Marital status: Divorced    Spouse name: Not on file  . Number of children: Not on file  . Years of education: Not on file  . Highest education level: Not on file  Occupational History  . Not on file  Social Needs  . Financial resource strain: Very hard  . Food insecurity:    Worry: Sometimes true    Inability: Sometimes true  . Transportation needs:    Medical: No    Non-medical: No  Tobacco Use  . Smoking status: Current Every Day Smoker    Packs/day: 0.25    Types: Cigarettes  . Smokeless tobacco: Never Used  Substance and Sexual Activity  . Alcohol use: Yes  . Drug use: Yes    Types: Marijuana  . Sexual activity: Yes    Birth control/protection: Other-see comments    Comment: Tubal ligation  Lifestyle  . Physical activity:    Days per week: 2 days    Minutes per session: 10 min  . Stress: Very much  Relationships  . Social connections:    Talks on phone: Three times a week    Gets together: More than three times a week    Attends religious service:  Never    Active member of club or organization: No    Attends meetings of clubs or organizations: Never    Relationship status: Divorced  Other Topics Concern  . Not on file  Social History Narrative  . Not on file    Hospital Course:  Per admission H&P 10/22/2018: Patient is a 48 year old female with a recent psychiatric hospitalization at our facility from 1/29 to 10/06/2018 for major depression and posttraumatic stress disorder. She  also has a past medical history significant for thrombus issues as well as chronic bilateral low back pain with sciatica. The patient stated that after she was discharged in the hospital she stated a hotel for approximately 2 weeks. She then went to live with her sister. She stated that the environment she was in with her sister was not good because of drug and alcohol use. She stated she was unable to afford her medications, and started taking her antidepressant and other medications on an every other day basis. She stated her depression began to worsen at that time. She stated she had followed up with a local mental health center this previous Wednesday, but it is unclear whether or not she told the staff there that she was only taking her medicines every other day, or whether or not they attempted to help her with this regard. She presented directly to the behavioral health hospital on 10/21/2018 complaining of worsening depression and suicidal ideation. She endorsed depressive symptoms of hopelessness, worthlessness, anhedonia, irritability, guilt, tearfulness and insomnia. She stated also at that time that she had been sleeping on the streets instead of going back to the environment she was living in due to the drug use and violence. She was admitted to the hospital for evaluation and stabilization.  Christina [name] was admitted for suicidal ideation with plan to jump in front of a car. She was recently admitted to [facility] 09/22/2018-10/06/2018 for similar presentation. She reported intermittent compliance with medications since discharge. She was restarted on Cymbalta, Neurontin, Minipress, PRN trazodone, and PRN Vistaril. PRN tramadol was started for complaints of pain in feet with bilateral lower extremity edema. Lasix was increased for edema. Patient initially stated she would not return to her niece's house. CSW made referrals for shelters but patient refused and stated she would return to niece's house.  Patient remained on the [facility] unit for 7 days. She stabilized with medications and therapy. She was discharged on the medications listed below. She has shown improvement with improved mood, affect, sleep, appetite, and interaction. She denies any SI/HI/AVH and contracts for safety. She agrees to follow up at [facility] and [facility] for PCP (see below). Patient is provided with prescriptions and medication samples upon discharge. Her friend is picking her up for discharge home with niece.  Physical Findings: AIMS: Facial and Oral Movements Muscles of Facial Expression: None, normal Lips and Perioral Area: None, normal Jaw: None, normal Tongue: None, normal,Extremity Movements Upper (arms, wrists, hands, fingers): None, normal Lower (legs, knees, ankles, toes): None, normal, Trunk Movements Neck, shoulders, hips: None, normal, Overall Severity Severity of abnormal movements (highest score from questions above): None, normal Incapacitation due to abnormal movements: None, normal Patient's awareness of abnormal movements (rate only patient's report): No Awareness, Dental Status Current problems with teeth and/or dentures?: No Does patient usually wear dentures?: No  CIWA:  CIWA-Ar Total: 0 COWS:  COWS Total Score: 0  Musculoskeletal: Strength & Muscle Tone: within normal limits Gait & Station: normal Patient leans: N/A  Psychiatric Specialty Exam: Physical Exam  Nursing note and vitals reviewed. Constitutional: She is oriented to person, place, and time. She appears well-developed and well-nourished.  Cardiovascular: Normal rate.  Respiratory: Effort normal.  Neurological: She is alert and oriented to person, place, and time.    Review of Systems  Constitutional: Negative.   Psychiatric/Behavioral: Positive for depression (improving) and substance abuse (UDS +THC). Negative for hallucinations, memory loss and suicidal ideas. The patient is not nervous/anxious and does  not have insomnia.     Blood pressure 111/76, pulse 90, temperature 98.1 F (36.7 C), temperature source Oral, resp. rate 16, height  (1.676 m), weight 71 kg, SpO2 100 %.Body mass index is 25.26 kg/m.  See MD's discharge SRA     Have you used any form of tobacco in the last 30 days? (Cigarettes, Smokeless Tobacco, Cigars, and/or Pipes): Yes  Has this patient used any form of tobacco in the last 30 days? (Cigarettes, Smokeless Tobacco, Cigars, and/or Pipes) Yes, a prescription for an FDA-approved medication for tobacco cessation was offered at discharge.   Blood Alcohol level:  Lab Results  Component Value Date   ETH <10 09/22/2018    Metabolic Disorder Labs:  Lab Results  Component Value Date   HGBA1C 6.0 (H) 09/30/2018   MPG 125.5 09/30/2018   No results found for: PROLACTIN Lab Results  Component Value Date   CHOL 195 09/30/2018   TRIG 105 09/30/2018   HDL 57 09/30/2018   CHOLHDL 3.4 09/30/2018   VLDL 21 09/30/2018   LDLCALC 117 (H) 09/30/2018    See Psychiatric Specialty Exam and Suicide Risk Assessment completed by Attending Physician prior to discharge.  Discharge destination:  Home  Is patient on multiple antipsychotic therapies at discharge:  No   Has Patient had three or more failed trials of antipsychotic monotherapy by history:  No  Recommended Plan for Multiple Antipsychotic Therapies: NA  Discharge Instructions    Discharge instructions   Complete by:  As directed    Patient is instructed to take all prescribed medications as recommended. Report any side effects or adverse reactions to your outpatient psychiatrist. Patient is instructed to abstain from alcohol and illegal drugs while on prescription medications. In the event of worsening symptoms, patient is instructed to call the crisis hotline, 911, or go to the nearest emergency department for evaluation and treatment.     Allergies as of 10/28/2018   No Known Allergies     Medication List     STOP taking these medications   ibuprofen 200 MG tablet Commonly known as:  ADVIL,MOTRIN   lidocaine 5 % Commonly known as:  LIDODERM   lurasidone 20 MG Tabs tablet Commonly known as:  LATUDA   oseltamivir 75 MG capsule Commonly known as:  TAMIFLU   pantoprazole 40 MG tablet Commonly known as:  PROTONIX     TAKE these medications     Indication  acetaminophen 325 MG tablet Commonly known as:  TYLENOL Take 2 tablets (650 mg total) by mouth every 6 (six) hours as needed for mild pain, moderate pain or headache. What changed:    reasons to take this  additional instructions  Indication:  Pain   DULoxetine 30 MG capsule Commonly known as:  CYMBALTA Take 3 capsules (90 mg total) by mouth daily. For mood Start taking on:  October 29, 2018 What changed:    medication strength  how much to take  Indication:  Fibromyalgia Syndrome, Major Depressive Disorder, Disease of the Peripheral  Nerves   furosemide 40 MG tablet Commonly known as:  LASIX Take 1 tablet (40 mg total) by mouth daily. For swelling Start taking on:  October 29, 2018 What changed:    medication strength  how much to take  Indication:  Edema   gabapentin 400 MG capsule Commonly known as:  NEURONTIN Take 1 capsule (400 mg total) by mouth 3 (three) times daily. For pain What changed:    medication strength  how much to take  Indication:  Neuropathic Pain   hydrOXYzine 25 MG tablet Commonly known as:  ATARAX/VISTARIL Take 1 tablet (25 mg total) by mouth every 6 (six) hours as needed for anxiety.  Indication:  Feeling Anxious   nicotine 21 mg/24hr patch Commonly known as:  NICODERM CQ - dosed in mg/24 hours Place 1 patch (21 mg total) onto the skin daily. For smoking cessation  Indication:  Nicotine Addiction   prazosin 1 MG capsule Commonly known as:  MINIPRESS Take 1 capsule (1 mg total) by mouth at bedtime. For nightmares  Indication:  Frightening Dreams   rivaroxaban 20 MG Tabs  tablet Commonly known as:  XARELTO Take 1 tablet (20 mg total) by mouth daily. For DVT prevention Start taking on:  October 29, 2018 What changed:  when to take this  Indication:  hx DVT   traMADol 50 MG tablet Commonly known as:  ULTRAM Take 1 tablet (50 mg total) by mouth every 12 (twelve) hours as needed for severe pain. What changed:  reasons to take this  Indication:  Pain   traZODone 100 MG tablet Commonly known as:  DESYREL Take 1 tablet (100 mg total) by mouth at bedtime as needed for sleep. What changed:    medication strength  how much to take  Indication:  Trouble Sleeping      Follow-up Information    [facility] Follow up on 11/02/2018.   Why:  Hospital follow up appointment is Tuesday, 3/10 at 8:00a. Please bring your current medications and discharge paperwork from this hospitalization.  Contact information: [address] [place] [address] [phone]        [facility] Follow up.   Why:  Primary care clinic for uninsured. Follow up for management of chronic pain, bilateral feet swelling and history of DVT. Please call to schedule appointment. Contact information: [address] [place] [address] [phone]          Follow-up recommendations: Activity as tolerated. Diet as recommended by primary care physician. Keep all scheduled follow-up appointments as recommended.   Comments:   Patient is instructed to take all prescribed medications as recommended. Report any side effects or adverse reactions to your outpatient psychiatrist. Patient is instructed to abstain from alcohol and illegal drugs while on prescription medications. In the event of worsening symptoms, patient is instructed to call the crisis hotline, 911, or go to the nearest emergency department for evaluation and treatment.  Signed: Aldean Baker, NP 10/28/2018, 11:46 AM   Patient seen, Suicide Assessment Completed.  Disposition  Plan Reviewed

## 2018-12-10 NOTE — Congregational Nurse Program (Signed)
Patient report a history of lower back pain.Patient reports taking Seroquel, Depakote, Prozac. Valarie Cones, LPN

## 2018-12-17 NOTE — Progress Notes (Signed)
COVID Hotel Screening performed. Temperature, PHQ-9, and need for medical care and medications assessed. Patient reports being on several medications and he needs assistance in getting them. PHQ-9=10. Patient given a copy of the [employer]. Referral made to [facility].  Carlyle Basques RN MSN

## 2018-12-21 ENCOUNTER — Telehealth: Payer: Self-pay | Admitting: Pediatric Intensive Care

## 2018-12-21 NOTE — Telephone Encounter (Signed)
Left HIPAA compliant message for client to call [phone] in regards to referral. Shann Medal RN BSN CNP [phone]

## 2018-12-24 NOTE — Progress Notes (Signed)
COVID Hotel Screening performed. Temperature, PHQ-9, and need for medical care and medications assessed. Patient reports needing medications. Currently receives them through [facility]. Encouraged the patient to call [facility] to get her medications. Patient given copy of the Behavioral Health Resources document. Asked patient to come back on Sunday for follow-up to reassess status.  Carlyle Basques RN MSN

## 2018-12-27 NOTE — Progress Notes (Signed)
Closing encounter per request. 

## 2018-12-31 NOTE — Progress Notes (Signed)
COVID Hotel Screening performed. Temperature, PHQ-9, and need for medical care and medications assessed. PHQ-9 = 9. Patient given a copy of the [employer] forms. No additional needs assessed at this time.  Carlyle Basques RN MSN

## 2019-01-07 NOTE — Progress Notes (Signed)
COVID Hotel Screening performed. Temperature, PHQ-9, and need for medical care and medications assessed. No additional needs assessed at this time.  Hanni Milford RN MSN 

## 2019-01-11 ENCOUNTER — Telehealth: Payer: Self-pay | Admitting: Pediatric Intensive Care

## 2019-01-11 NOTE — Telephone Encounter (Signed)
CN left HIPAA compliant message to return call if any needs. Shann Medal RN BSN CNP [phone]

## 2019-01-14 NOTE — Progress Notes (Signed)
COVID Hotel Screening performed. Temperature, PHQ-9, and need for medical care and medications assessed. No additional needs assessed at this time.  Lourene Hoston RN MSN 

## 2019-01-18 ENCOUNTER — Telehealth: Payer: Self-pay | Admitting: *Deleted

## 2019-01-18 DIAGNOSIS — Z20822 Contact with and (suspected) exposure to covid-19: Secondary | ICD-10-CM

## 2019-01-18 NOTE — Telephone Encounter (Signed)
Order placed for COVID-19 testing.  

## 2019-01-21 NOTE — Progress Notes (Signed)
COVID Hotel Screening performed. Temperature, PHQ-9, and need for medical care and medications assessed. Patient agreed to the COVID-19 testing. No additional needs assessed at this time.  Hykeem Ojeda RN MSN 

## 2019-01-26 LAB — NOVEL CORONAVIRUS, NAA: SARS-CoV-2, NAA: NOT DETECTED

## 2019-01-27 ENCOUNTER — Telehealth: Payer: Self-pay

## 2019-01-27 ENCOUNTER — Encounter: Payer: Self-pay | Admitting: *Deleted

## 2019-01-27 NOTE — Progress Notes (Signed)
COVID Hotel Screening performed. Temperature, PHQ-9, and need for medical care and medications assessed. No additional needs assessed at this time.  Luwanna Brossman RN MSN 

## 2019-01-27 NOTE — Telephone Encounter (Signed)
Attempted to call to give patient COVID results. LVM to return call.

## 2019-02-04 NOTE — Progress Notes (Signed)
COVID Hotel Screening performed. COVID screening, temperature, PHQ-9, and need for medical care and medications assessed. No additional needs assessed at this time.  Angeleigh Chiasson RN MSN 

## 2019-03-04 ENCOUNTER — Other Ambulatory Visit: Payer: Self-pay

## 2019-03-04 DIAGNOSIS — Z20822 Contact with and (suspected) exposure to covid-19: Secondary | ICD-10-CM

## 2019-03-11 LAB — NOVEL CORONAVIRUS, NAA: SARS-CoV-2, NAA: NOT DETECTED

## 2019-03-11 NOTE — Addendum Note (Signed)
Addended by: Brigitte Pulse on: 03/11/2019 11:47 AM   Modules accepted: Orders

## 2023-04-27 ENCOUNTER — Emergency Department (HOSPITAL_COMMUNITY)
Admission: EM | Admit: 2023-04-27 | Discharge: 2023-04-27 | Disposition: A | Discharge status: Home / Self Care | Payer: MEDICAID | Source: Home / Self Care | Attending: Emergency Medicine | Admitting: Emergency Medicine

## 2023-04-27 ENCOUNTER — Encounter (HOSPITAL_COMMUNITY): Payer: Self-pay

## 2023-04-27 DIAGNOSIS — B029 Zoster without complications: Secondary | ICD-10-CM | POA: Insufficient documentation

## 2023-04-27 DIAGNOSIS — Z7901 Long term (current) use of anticoagulants: Secondary | ICD-10-CM | POA: Insufficient documentation

## 2023-04-27 DIAGNOSIS — R202 Paresthesia of skin: Secondary | ICD-10-CM | POA: Insufficient documentation

## 2023-04-27 DIAGNOSIS — D72819 Decreased white blood cell count, unspecified: Secondary | ICD-10-CM | POA: Insufficient documentation

## 2023-04-27 LAB — CBC
HCT: 42.5 % (ref 36.0–46.0)
Hemoglobin: 14 g/dL (ref 12.0–15.0)
MCH: 28.6 pg (ref 26.0–34.0)
MCHC: 32.9 g/dL (ref 30.0–36.0)
MCV: 86.7 fL (ref 80.0–100.0)
Platelets: 162 10*3/uL (ref 150–400)
RBC: 4.9 MIL/uL (ref 3.87–5.11)
RDW: 14.6 % (ref 11.5–15.5)
WBC: 2.9 10*3/uL — ABNORMAL LOW (ref 4.0–10.5)
nRBC: 0 % (ref 0.0–0.2)

## 2023-04-27 LAB — DIFFERENTIAL
Abs Immature Granulocytes: 0.01 10*3/uL (ref 0.00–0.07)
Basophils Absolute: 0 10*3/uL (ref 0.0–0.1)
Basophils Relative: 0 %
Eosinophils Absolute: 0 10*3/uL (ref 0.0–0.5)
Eosinophils Relative: 1 %
Immature Granulocytes: 0 %
Lymphocytes Relative: 19 %
Lymphs Abs: 0.6 10*3/uL — ABNORMAL LOW (ref 0.7–4.0)
Monocytes Absolute: 0.1 10*3/uL (ref 0.1–1.0)
Monocytes Relative: 4 %
Neutro Abs: 2.1 10*3/uL (ref 1.7–7.7)
Neutrophils Relative %: 76 %

## 2023-04-27 LAB — BASIC METABOLIC PANEL
Anion gap: 10 (ref 5–15)
BUN: 15 mg/dL (ref 6–20)
CO2: 22 mmol/L (ref 22–32)
Calcium: 8.8 mg/dL — ABNORMAL LOW (ref 8.9–10.3)
Chloride: 102 mmol/L (ref 98–111)
Creatinine, Ser: 0.93 mg/dL (ref 0.44–1.00)
GFR, Estimated: >60 mL/min (ref >60)
Glucose, Bld: 86 mg/dL (ref 70–99)
Potassium: 3.5 mmol/L (ref 3.5–5.1)
Sodium: 134 mmol/L — ABNORMAL LOW (ref 135–145)

## 2023-04-27 LAB — HIV ANTIBODY (ROUTINE TESTING W REFLEX): HIV Screen 4th Generation wRfx: REACTIVE — AB

## 2023-04-27 MED ORDER — OXYCODONE-ACETAMINOPHEN 5-325 MG PO TABS: 1.0000 | ORAL_TABLET | Freq: Three times a day (TID) | ORAL | 0 refills | Status: DC | PRN

## 2023-04-27 MED ORDER — VALACYCLOVIR HCL 1 G PO TABS: 1000.0000 mg | ORAL_TABLET | Freq: Three times a day (TID) | ORAL | 0 refills | Status: DC

## 2023-04-27 NOTE — ED Triage Notes (Addendum)
Patient BIB [facility] from home. Complaining of lower back pain and rash under left breast. Red around area with blister like appearance. Patient believes it is shingles. Has been putting peroxide under it. Complaining of numbness for 3 weeks in both feet and both hands along with right side of her face. Patient does not want needles, only wants oral pills.

## 2023-04-27 NOTE — ED Provider Notes (Signed)
[place] EMERGENCY DEPARTMENT AT [facility] Provider Note   CSN: [id number] Arrival date & time: 04/27/23  0845     History  Chief Complaint  Patient presents with   Back Pain    Christina [name] is a 52 y.o. female.   Back Pain Patient presents with left flank rash.  Has had for 2 days.  She thinks it could be shingles.  Does have pain.  She has been putting peroxide on it.  Did have chickenpox when she was younger.  Also complaining of tingling in hands and feet.  Also right side of her face.  States that has been 3 weeks.  No numbness or weakness.  Patient states she is homeless.  States she does get a check and cannot afford some medicines.  Does have history depression and states she has been at [facility] [employer].  She is depressed for being homeless but denies suicidal thoughts at this time.    Past Medical History:  Diagnosis Date   Anxiety    Back pain    DVT (deep venous thrombosis) (HCC)    left leg with stent    Home Medications Prior to Admission medications   Medication Sig Start Date End Date Taking? Authorizing Provider  acetaminophen (TYLENOL) 325 MG tablet Take 2 tablets (650 mg total) by mouth every 6 (six) hours as needed for mild pain, moderate pain or headache. 10/28/18   Aldean Baker, NP  DULoxetine (CYMBALTA) 30 MG capsule Take 3 capsules (90 mg total) by mouth daily. For mood 10/29/18   Aldean Baker, NP  furosemide (LASIX) 40 MG tablet Take 1 tablet (40 mg total) by mouth daily. For swelling 10/29/18   Aldean Baker, NP  gabapentin (NEURONTIN) 400 MG capsule Take 1 capsule (400 mg total) by mouth 3 (three) times daily. For pain 10/28/18   Aldean Baker, NP  hydrOXYzine (ATARAX/VISTARIL) 25 MG tablet Take 1 tablet (25 mg total) by mouth every 6 (six) hours as needed for anxiety. 10/28/18   Aldean Baker, NP  nicotine (NICODERM CQ - DOSED IN MG/24 HOURS) 21 mg/24hr patch Place 1 patch (21 mg total) onto the skin daily. For smoking  cessation 10/28/18   Aldean Baker, NP  prazosin (MINIPRESS) 1 MG capsule Take 1 capsule (1 mg total) by mouth at bedtime. For nightmares 10/28/18   Aldean Baker, NP  rivaroxaban (XARELTO) 20 MG TABS tablet Take 1 tablet (20 mg total) by mouth daily. For DVT prevention 10/29/18   Aldean Baker, NP  traMADol (ULTRAM) 50 MG tablet Take 1 tablet (50 mg total) by mouth every 12 (twelve) hours as needed for severe pain. 10/28/18   Aldean Baker, NP  traZODone (DESYREL) 100 MG tablet Take 1 tablet (100 mg total) by mouth at bedtime as needed for sleep. 10/28/18   Aldean Baker, NP      Allergies    Patient has no known allergies.    Review of Systems   Review of Systems  Musculoskeletal:  Positive for back pain.    Physical Exam Updated Vital Signs BP (!) 162/96 (BP Location: Left Arm)   Pulse 99   Temp 98.4 F (36.9 C) (Oral)   Resp 16   Ht 5\' 6"  (1.676 m)   Wt 71 kg   SpO2 100%   BMI 25.26 kg/m  Physical Exam Vitals and nursing note reviewed.  HENT:     Head: Atraumatic.  Mouth/Throat:     Comments: Poor dentition.  Face symmetric. Musculoskeletal:     Cervical back: Neck supple.  Skin:    Comments: Erythematous blistering rash along the left lower chest/upper abdomen and flank.  Neurological:     Mental Status: She is alert.     Comments: Sensation grossly intact.  Strength intact upper lower extremities.  Face symmetrical equal smile.  States paresthesias to right face.     ED Results / Procedures / Treatments   Labs (all labs ordered are listed, but only abnormal results are displayed) Labs Reviewed  BASIC METABOLIC PANEL - Abnormal; Notable for the following components:      Result Value   Sodium 134 (*)    Calcium 8.8 (*)    All other components within normal limits  CBC - Abnormal; Notable for the following components:   WBC 2.9 (*)    All other components within normal limits  DIFFERENTIAL - Abnormal; Notable for the following components:   Lymphs Abs 0.6 (*)     All other components within normal limits  HIV ANTIBODY (ROUTINE TESTING W REFLEX)    EKG None  Radiology No results found.  Procedures Procedures    Medications Ordered in ED Medications - No data to display  ED Course/ Medical Decision Making/ A&P                                 Medical Decision Making Amount and/or Complexity of Data Reviewed Labs: ordered.   Patient with rash to left chest.  Appears to be a shingles.  White count slightly low.  Did get HIV which is still pending.  Blood work otherwise reassuring.  Has paresthesias in hands and feet.  Most likely neuropathy.  Not lateralizing.  Doubt stroke as the cause.  Will discharge home with pain medicines and antivirals.        Final Clinical Impression(s) / ED Diagnoses Final diagnoses:  Herpes zoster without complication  Paresthesias    Rx / DC Orders ED Discharge Orders     None         Benjiman Core, MD 04/27/23 1241

## 2023-04-28 ENCOUNTER — Inpatient Hospital Stay (HOSPITAL_COMMUNITY)
Admission: EM | Admit: 2023-04-28 | Discharge: 2023-05-05 | DRG: 975 | Disposition: A | Discharge status: Home / Self Care | Payer: MEDICAID | Attending: Internal Medicine | Admitting: Internal Medicine

## 2023-04-28 ENCOUNTER — Encounter (HOSPITAL_COMMUNITY): Payer: Self-pay | Admitting: Emergency Medicine

## 2023-04-28 ENCOUNTER — Emergency Department (HOSPITAL_COMMUNITY): Payer: MEDICAID

## 2023-04-28 ENCOUNTER — Other Ambulatory Visit: Payer: Self-pay

## 2023-04-28 DIAGNOSIS — R531 Weakness: Secondary | ICD-10-CM | POA: Diagnosis not present

## 2023-04-28 DIAGNOSIS — F17210 Nicotine dependence, cigarettes, uncomplicated: Secondary | ICD-10-CM | POA: Diagnosis present

## 2023-04-28 DIAGNOSIS — B007 Disseminated herpesviral disease: Secondary | ICD-10-CM | POA: Diagnosis present

## 2023-04-28 DIAGNOSIS — F32A Depression, unspecified: Secondary | ICD-10-CM | POA: Diagnosis present

## 2023-04-28 DIAGNOSIS — E871 Hypo-osmolality and hyponatremia: Secondary | ICD-10-CM

## 2023-04-28 DIAGNOSIS — W19XXXA Unspecified fall, initial encounter: Secondary | ICD-10-CM

## 2023-04-28 DIAGNOSIS — Z5941 Food insecurity: Secondary | ICD-10-CM | POA: Diagnosis not present

## 2023-04-28 DIAGNOSIS — Z1152 Encounter for screening for COVID-19: Secondary | ICD-10-CM

## 2023-04-28 DIAGNOSIS — Z5986 Financial insecurity: Secondary | ICD-10-CM

## 2023-04-28 DIAGNOSIS — A419 Sepsis, unspecified organism: Secondary | ICD-10-CM | POA: Diagnosis not present

## 2023-04-28 DIAGNOSIS — Z634 Disappearance and death of family member: Secondary | ICD-10-CM | POA: Diagnosis not present

## 2023-04-28 DIAGNOSIS — Z5900 Homelessness unspecified: Secondary | ICD-10-CM

## 2023-04-28 DIAGNOSIS — R45851 Suicidal ideations: Secondary | ICD-10-CM | POA: Diagnosis present

## 2023-04-28 DIAGNOSIS — E872 Acidosis, unspecified: Secondary | ICD-10-CM | POA: Diagnosis present

## 2023-04-28 DIAGNOSIS — L0390 Cellulitis, unspecified: Secondary | ICD-10-CM | POA: Diagnosis present

## 2023-04-28 DIAGNOSIS — F332 Major depressive disorder, recurrent severe without psychotic features: Secondary | ICD-10-CM | POA: Diagnosis not present

## 2023-04-28 DIAGNOSIS — I10 Essential (primary) hypertension: Secondary | ICD-10-CM | POA: Diagnosis present

## 2023-04-28 DIAGNOSIS — I69392 Facial weakness following cerebral infarction: Secondary | ICD-10-CM | POA: Diagnosis not present

## 2023-04-28 DIAGNOSIS — Z789 Other specified health status: Secondary | ICD-10-CM | POA: Diagnosis not present

## 2023-04-28 DIAGNOSIS — B20 Human immunodeficiency virus [HIV] disease: Secondary | ICD-10-CM | POA: Diagnosis present

## 2023-04-28 DIAGNOSIS — G510 Bell's palsy: Secondary | ICD-10-CM | POA: Diagnosis present

## 2023-04-28 DIAGNOSIS — Z7901 Long term (current) use of anticoagulants: Secondary | ICD-10-CM | POA: Diagnosis not present

## 2023-04-28 DIAGNOSIS — F331 Major depressive disorder, recurrent, moderate: Secondary | ICD-10-CM | POA: Diagnosis not present

## 2023-04-28 DIAGNOSIS — L03319 Cellulitis of trunk, unspecified: Secondary | ICD-10-CM | POA: Diagnosis present

## 2023-04-28 DIAGNOSIS — D72819 Decreased white blood cell count, unspecified: Secondary | ICD-10-CM | POA: Diagnosis present

## 2023-04-28 DIAGNOSIS — F410 Panic disorder [episodic paroxysmal anxiety] without agoraphobia: Secondary | ICD-10-CM | POA: Diagnosis present

## 2023-04-28 DIAGNOSIS — E876 Hypokalemia: Secondary | ICD-10-CM | POA: Diagnosis present

## 2023-04-28 DIAGNOSIS — F4310 Post-traumatic stress disorder, unspecified: Secondary | ICD-10-CM | POA: Diagnosis present

## 2023-04-28 DIAGNOSIS — Z21 Asymptomatic human immunodeficiency virus [HIV] infection status: Secondary | ICD-10-CM

## 2023-04-28 DIAGNOSIS — B028 Zoster with other complications: Secondary | ICD-10-CM | POA: Diagnosis not present

## 2023-04-28 DIAGNOSIS — Z86718 Personal history of other venous thrombosis and embolism: Secondary | ICD-10-CM

## 2023-04-28 DIAGNOSIS — B029 Zoster without complications: Secondary | ICD-10-CM

## 2023-04-28 DIAGNOSIS — R296 Repeated falls: Secondary | ICD-10-CM

## 2023-04-28 DIAGNOSIS — A4189 Other specified sepsis: Secondary | ICD-10-CM | POA: Diagnosis present

## 2023-04-28 DIAGNOSIS — Z79899 Other long term (current) drug therapy: Secondary | ICD-10-CM

## 2023-04-28 LAB — RESP PANEL BY RT-PCR (RSV, FLU A&B, COVID)  RVPGX2
Influenza A by PCR: NEGATIVE
Influenza B by PCR: NEGATIVE
Resp Syncytial Virus by PCR: NEGATIVE
SARS Coronavirus 2 by RT PCR: NEGATIVE

## 2023-04-28 LAB — CBC WITH DIFFERENTIAL/PLATELET
Abs Immature Granulocytes: 0.02 10*3/uL (ref 0.00–0.07)
Basophils Absolute: 0 10*3/uL (ref 0.0–0.1)
Basophils Relative: 0 %
Eosinophils Absolute: 0 10*3/uL (ref 0.0–0.5)
Eosinophils Relative: 0 %
HCT: 37.6 % (ref 36.0–46.0)
Hemoglobin: 12.7 g/dL (ref 12.0–15.0)
Immature Granulocytes: 1 %
Lymphocytes Relative: 25 %
Lymphs Abs: 0.7 10*3/uL (ref 0.7–4.0)
MCH: 28.7 pg (ref 26.0–34.0)
MCHC: 33.8 g/dL (ref 30.0–36.0)
MCV: 84.9 fL (ref 80.0–100.0)
Monocytes Absolute: 0.2 10*3/uL (ref 0.1–1.0)
Monocytes Relative: 6 %
Neutro Abs: 1.8 10*3/uL (ref 1.7–7.7)
Neutrophils Relative %: 68 %
Platelets: 154 10*3/uL (ref 150–400)
RBC: 4.43 MIL/uL (ref 3.87–5.11)
RDW: 14.5 % (ref 11.5–15.5)
WBC: 2.7 10*3/uL — ABNORMAL LOW (ref 4.0–10.5)
nRBC: 0 % (ref 0.0–0.2)

## 2023-04-28 LAB — COMPREHENSIVE METABOLIC PANEL
ALT: 14 U/L (ref 0–44)
AST: 28 U/L (ref 15–41)
Albumin: 3.4 g/dL — ABNORMAL LOW (ref 3.5–5.0)
Alkaline Phosphatase: 75 U/L (ref 38–126)
Anion gap: 11 (ref 5–15)
BUN: 12 mg/dL (ref 6–20)
CO2: 20 mmol/L — ABNORMAL LOW (ref 22–32)
Calcium: 8.7 mg/dL — ABNORMAL LOW (ref 8.9–10.3)
Chloride: 102 mmol/L (ref 98–111)
Creatinine, Ser: 1.11 mg/dL — ABNORMAL HIGH (ref 0.44–1.00)
GFR, Estimated: 60 mL/min — ABNORMAL LOW (ref >60)
Glucose, Bld: 113 mg/dL — ABNORMAL HIGH (ref 70–99)
Potassium: 3.4 mmol/L — ABNORMAL LOW (ref 3.5–5.1)
Sodium: 133 mmol/L — ABNORMAL LOW (ref 135–145)
Total Bilirubin: 0.7 mg/dL (ref 0.3–1.2)
Total Protein: 9.2 g/dL — ABNORMAL HIGH (ref 6.5–8.1)

## 2023-04-28 LAB — HCG, SERUM, QUALITATIVE: Preg, Serum: NEGATIVE

## 2023-04-28 LAB — URINALYSIS, W/ REFLEX TO CULTURE (INFECTION SUSPECTED)
Bilirubin Urine: NEGATIVE
Glucose, UA: NEGATIVE mg/dL
Hgb urine dipstick: NEGATIVE
Ketones, ur: NEGATIVE mg/dL
Leukocytes,Ua: NEGATIVE
Nitrite: NEGATIVE
Protein, ur: NEGATIVE mg/dL
Specific Gravity, Urine: 1.015 (ref 1.005–1.030)
pH: 6 (ref 5.0–8.0)

## 2023-04-28 LAB — I-STAT CG4 LACTIC ACID, ED
Lactic Acid, Venous: 1.7 mmol/L (ref 0.5–1.9)
Lactic Acid, Venous: 1.6 mmol/L (ref 0.5–1.9)

## 2023-04-28 LAB — LACTIC ACID, PLASMA: Lactic Acid, Venous: 1.5 mmol/L (ref 0.5–1.9)

## 2023-04-28 LAB — APTT: aPTT: 27 s (ref 24–36)

## 2023-04-28 LAB — PROTIME-INR
INR: 1 (ref 0.8–1.2)
Prothrombin Time: 13.1 s (ref 11.4–15.2)

## 2023-04-28 MED ORDER — LACTATED RINGERS IV SOLN: INTRAVENOUS | Status: DC

## 2023-04-28 MED ORDER — SODIUM CHLORIDE 0.9 % IV SOLN
2.0000 g | Freq: Once | INTRAVENOUS | Status: AC
  Administered 2023-04-28: 2 g via INTRAVENOUS
  Filled 2023-04-28: qty 20

## 2023-04-28 MED ORDER — ACETAMINOPHEN 325 MG PO TABS
650.0000 mg | ORAL_TABLET | Freq: Once | ORAL | Status: AC
  Administered 2023-04-28: 650 mg via ORAL
  Filled 2023-04-28: qty 2

## 2023-04-28 MED ORDER — LACTATED RINGERS IV BOLUS (SEPSIS)
1000.0000 mL | Freq: Once | INTRAVENOUS | Status: AC
  Administered 2023-04-28: 1000 mL via INTRAVENOUS

## 2023-04-28 MED ORDER — VANCOMYCIN HCL 1500 MG/300ML IV SOLN
1500.0000 mg | Freq: Once | INTRAVENOUS | Status: AC
  Administered 2023-04-28: 1500 mg via INTRAVENOUS
  Filled 2023-04-28 (×2): qty 300

## 2023-04-28 MED ORDER — VANCOMYCIN HCL IN DEXTROSE 1-5 GM/200ML-% IV SOLN: 1000.0000 mg | Freq: Once | INTRAVENOUS | Status: DC

## 2023-04-28 NOTE — Progress Notes (Signed)
ED Pharmacy Antibiotic Sign Off An antibiotic consult was received from an ED provider for vancomycin per pharmacy dosing for sepsis. A chart review was completed to assess appropriateness.  A single dose of ceftriaxone was placed by the ED provider.   The following one time order(s) were placed per pharmacy consult:  vancomycin 1500 mg x 1 dose  Further antibiotic and/or antibiotic pharmacy consults should be ordered by the admitting provider if indicated.   Thank you for allowing pharmacy to be a part of this patient's care.   Delmar Landau, PharmD, BCPS 04/28/2023 8:30 PM ED Clinical Pharmacist -  [phone]

## 2023-04-28 NOTE — ED Provider Triage Note (Signed)
Emergency Medicine Provider Triage Evaluation Note  Christina [name] , a 52 y.o. female  was evaluated in triage.  Pt complains of weakness.  Review of Systems  Positive:  Negative:  Physical Exam  There were no vitals taken for this visit. Gen:   Awake, no distress   Resp:  Normal effort  MSK:   Moves extremities without difficulty  Other:    Medical Decision Making  Medically screening exam initiated at 7:31 PM.  Appropriate orders placed.  Christina [name] was informed that the remainder of the evaluation will be completed by another provider, this initial triage assessment does not replace that evaluation, and the importance of remaining in the ED until their evaluation is complete.  Concerned for weakness x 4 days. Also complaining of back pain. Of note, it looks like patient was seen yesterday for shingles.  Denies fever, chest pain, dyspnea, cough, nausea, vomiting, diarrhea, dysuria, hematuria, hematochezia. Denies LOC, seizures.  Patient meeting SIRS criteria upon arrival with Temp 103.0 and HR in the 120's.    Christina [name], [place] 04/28/23 1935

## 2023-04-28 NOTE — Sepsis Progress Note (Addendum)
Elink monitoring for the code sepsis protocol.  Secure chat sent to [facility] requesting orders for antibiotics & second lactic.

## 2023-04-28 NOTE — ED Triage Notes (Signed)
Patient from home with c/o fall x2 at home today per EMS. EMS states patient slide out of bed did not hit head, no LOC however c/o of increased weakness. Dx w/ shingles yesterday at [facility]. Per EMS patient with facial droop to the right side and slurred speech but this has been occurring for a year and no new deficits. BP 130/90 CBG 91 HR 90 Spo2-100% on RA.

## 2023-04-28 NOTE — ED Provider Notes (Signed)
[place] EMERGENCY DEPARTMENT AT [facility] Provider Note   CSN: [id number] Arrival date & time: 04/28/23  1927     History  Chief Complaint  Patient presents with   Fall   Weakness    Christina [name] is a 52 y.o. female presents to ED concerned for weakness x4 days. Patient diagnosed with shingles yesterday. Of note, patient with reactive HIV test yesterday during her ED visit. Denies any infectious symptoms at this time.  Denies chest pain, dyspnea, cough, nausea, vomiting, diarrhea, abdominal pain, dysuria, hematuria, hematochezia.    Fall  Weakness      Home Medications Prior to Admission medications   Medication Sig Start Date End Date Taking? Authorizing Provider  acetaminophen (TYLENOL) 325 MG tablet Take 2 tablets (650 mg total) by mouth every 6 (six) hours as needed for mild pain, moderate pain or headache. 10/28/18   Aldean Baker, NP  DULoxetine (CYMBALTA) 30 MG capsule Take 3 capsules (90 mg total) by mouth daily. For mood 10/29/18   Aldean Baker, NP  furosemide (LASIX) 40 MG tablet Take 1 tablet (40 mg total) by mouth daily. For swelling 10/29/18   Aldean Baker, NP  gabapentin (NEURONTIN) 400 MG capsule Take 1 capsule (400 mg total) by mouth 3 (three) times daily. For pain 10/28/18   Aldean Baker, NP  hydrOXYzine (ATARAX/VISTARIL) 25 MG tablet Take 1 tablet (25 mg total) by mouth every 6 (six) hours as needed for anxiety. 10/28/18   Aldean Baker, NP  nicotine (NICODERM CQ - DOSED IN MG/24 HOURS) 21 mg/24hr patch Place 1 patch (21 mg total) onto the skin daily. For smoking cessation 10/28/18   Aldean Baker, NP  oxyCODONE-acetaminophen (PERCOCET/ROXICET) 5-325 MG tablet Take 1-2 tablets by mouth every 8 (eight) hours as needed for severe pain. 04/27/23   Benjiman Core, MD  prazosin (MINIPRESS) 1 MG capsule Take 1 capsule (1 mg total) by mouth at bedtime. For nightmares 10/28/18   Aldean Baker, NP  rivaroxaban (XARELTO) 20 MG TABS tablet Take 1 tablet (20  mg total) by mouth daily. For DVT prevention 10/29/18   Aldean Baker, NP  traZODone (DESYREL) 100 MG tablet Take 1 tablet (100 mg total) by mouth at bedtime as needed for sleep. 10/28/18   Aldean Baker, NP  valACYclovir (VALTREX) 1000 MG tablet Take 1 tablet (1,000 mg total) by mouth 3 (three) times daily. 04/27/23   Benjiman Core, MD      Allergies    Patient has no known allergies.    Review of Systems   Review of Systems  Neurological:  Positive for weakness.    Physical Exam Updated Vital Signs BP (!) 134/95 (BP Location: Left Arm)   Pulse (!) 117   Temp 99.7 F (37.6 C) (Oral)   Resp 14   Ht 5\' 6"  (1.676 m)   Wt 71 kg   SpO2 97%   BMI 25.26 kg/m  Physical Exam Vitals and nursing note reviewed.  Constitutional:      General: She is not in acute distress.    Appearance: She is not toxic-appearing.  HENT:     Head: Normocephalic and atraumatic.     Mouth/Throat:     Mouth: Mucous membranes are moist.  Eyes:     General: No scleral icterus.       Right eye: No discharge.        Left eye: No discharge.     Conjunctiva/sclera: Conjunctivae normal.  Cardiovascular:  Rate and Rhythm: Regular rhythm. Tachycardia present.     Pulses: Normal pulses.     Heart sounds: Normal heart sounds. No murmur heard. Pulmonary:     Effort: Pulmonary effort is normal. No respiratory distress.     Breath sounds: Normal breath sounds. No wheezing, rhonchi or rales.  Abdominal:     General: Abdomen is flat. Bowel sounds are normal.     Palpations: Abdomen is soft.     Tenderness: There is no abdominal tenderness.  Musculoskeletal:     Right lower leg: No edema.     Left lower leg: No edema.  Skin:    General: Skin is warm and dry.     Findings: No rash.     Comments: Shingles rash present under left breast with increased erythema  Neurological:     General: No focal deficit present.     Mental Status: She is alert. Mental status is at baseline.  Psychiatric:        Mood and  Affect: Mood normal.     ED Results / Procedures / Treatments   Labs (all labs ordered are listed, but only abnormal results are displayed) Labs Reviewed  COMPREHENSIVE METABOLIC PANEL - Abnormal; Notable for the following components:      Result Value   Sodium 133 (*)    Potassium 3.4 (*)    CO2 20 (*)    Glucose, Bld 113 (*)    Creatinine, Ser 1.11 (*)    Calcium 8.7 (*)    Total Protein 9.2 (*)    Albumin 3.4 (*)    GFR, Estimated 60 (*)    All other components within normal limits  CBC WITH DIFFERENTIAL/PLATELET - Abnormal; Notable for the following components:   WBC 2.7 (*)    All other components within normal limits  RESP PANEL BY RT-PCR (RSV, FLU A&B, COVID)  RVPGX2  CULTURE, BLOOD (ROUTINE X 2)  CULTURE, BLOOD (ROUTINE X 2)  PROTIME-INR  APTT  HCG, SERUM, QUALITATIVE  URINALYSIS, W/ REFLEX TO CULTURE (INFECTION SUSPECTED)  LACTIC ACID, PLASMA  LACTIC ACID, PLASMA  T-HELPER CELLS (CD4) COUNT (NOT AT [facility])  I-STAT CG4 LACTIC ACID, ED  I-STAT CG4 LACTIC ACID, ED    EKG EKG Interpretation Date/Time:  Tuesday April 28 2023 20:17:59 EDT Ventricular Rate:  126 PR Interval:  116 QRS Duration:  62 QT Interval:  312 QTC Calculation: 451 R Axis:   -46  Text Interpretation: Sinus tachycardia Left axis deviation Abnormal ECG When compared with ECG of 29-Sep-2018 18:39, Since last tracing rate faster Confirmed by Richardean Canal ([id number]) on 04/28/2023 8:37:12 PM  Radiology DG Chest Port 1 View  Result Date: 04/28/2023 CLINICAL DATA:  Questionable sepsis - evaluate for abnormality Fall at home.  Weakness. EXAM: PORTABLE CHEST 1 VIEW COMPARISON:  Chest CT 11/03/2016 FINDINGS: The cardiomediastinal contours are normal. The lungs are clear. Pulmonary vasculature is normal. No consolidation, pleural effusion, or pneumothorax. No acute osseous abnormalities are seen. IMPRESSION: Negative radiograph of the chest. Electronically Signed   By: Narda Rutherford M.D.   On: 04/28/2023  21:31    Procedures Procedures    Medications Ordered in ED Medications  lactated ringers infusion (has no administration in time range)  vancomycin (VANCOREADY) IVPB 1500 mg/300 mL (1,500 mg Intravenous New Bag/Given 04/28/23 2159)  lactated ringers bolus 1,000 mL (1,000 mLs Intravenous New Bag/Given 04/28/23 2105)    And  lactated ringers bolus 1,000 mL (1,000 mLs Intravenous New Bag/Given 04/28/23 2105)  cefTRIAXone (  ROCEPHIN) 2 g in sodium chloride 0.9 % 100 mL IVPB (0 g Intravenous Stopped 04/28/23 2157)  acetaminophen (TYLENOL) tablet 650 mg (650 mg Oral Given 04/28/23 2102)    ED Course/ Medical Decision Making/ A&P                                 Medical Decision Making Amount and/or Complexity of Data Reviewed Labs: ordered. Radiology: ordered. ECG/medicine tests: ordered.  Risk OTC drugs. Prescription drug management.   This patient presents to the ED for concern of weakness, this involves an extensive number of treatment options, and is a complaint that carries with it a high risk of complications and morbidity.  The differential diagnosis includes irritant contact dermatitis, DRESS, atopic dermatitis, anaphylaxis, SJS/TEN   Co morbidities that complicate the patient evaluation  Homeless, HIV   Lab Tests:  I Ordered, and personally interpreted labs.  The pertinent results include:   -CMP: mild hyponatremia and mild hypokalemia; Cr mildly elevated -CBC: No concern for anemia or leukocytosis -LA: 1.7 -blood cultures: pending -Respiratory panel: negative -hcg: negative -PT-INR/APTT: within normal limits -UA: pending   Imaging Studies ordered:  I ordered imaging studies including  -chest xray: to assess for process contributing to patient symptom  I independently visualized and interpreted imaging  I agree with the radiologist interpretation   Cardiac Monitoring: / EKG:  The patient was maintained on a cardiac monitor.  I personally viewed and interpreted  the cardiac monitored which showed an underlying rhythm of: tachycardia    Problem List / ED Course / Critical interventions / Medication management  Admitting patient for cellulitis and sepsis Presents to ED concerned for generalized weakness x4 days. Patient meeting SIRS criteria on arrival with fever of 103.0 and HR in the 120's. Patient also with shingles rash diagnosed yesterday - today's physical exam concerning for cellulitis with increased erythema of lateral side.    Of note, Patient also found to be on the ground sitting in her own urine because she was too weak to make it to the bathroom. Rest of neuro exam with baseline left sided facial deficits, but no new unilateral deficits. CMP with mild hyponatremia at 133, mild hypokalemia at 3.4.  LA 1.7. CBC without leukocytosis or anemia. Respiratory panel negative. Chest xray without acute cardiopulmonary disease. Of note, HIV test obtained yesterday was reactive - the reflex testing is still pending. I added CD4 count to blood workup. Started patient on Rocephin, Vanc, IV fluids, and some tylenol. Other than the cellulitis and HIV, I am not seeing any obvious sources of infection. Patient declining any other infectious complaint. I have reviewed the patients home medicines and have made adjustments as needed  Ddx: these are considered less likely due to history of present illness and physical exam - irritant contact dermatitis: patient without irritant exposure -DRESS: patient afebrile, stable vitals  -SJS/TEN: negative Nikolsky sign -[place] Spotted Fever/Lyme Disease: no hx tick bite or fever  10:17 PM Care of Gertie Orban transferred to  Dr. Silverio Lay at the end of my shift as the patient will require reassessment once labs/imaging have resulted. Patient presentation, ED course, and plan of care discussed with review of all pertinent labs and imaging. Please see his/her note for further details regarding further ED course and  disposition. Plan at time of handoff is admit patient. This may be altered or completely changed at the discretion of the oncoming team pending  results of further workup.    Social Determinants of Health:  homelessness          Final Clinical Impression(s) / ED Diagnoses Final diagnoses:  None    Rx / DC Orders ED Discharge Orders     None         Margarita Rana 04/28/23 2218    Charlynne Pander, MD 04/28/23 2240

## 2023-04-29 ENCOUNTER — Other Ambulatory Visit (HOSPITAL_COMMUNITY): Payer: Self-pay

## 2023-04-29 DIAGNOSIS — A419 Sepsis, unspecified organism: Secondary | ICD-10-CM

## 2023-04-29 DIAGNOSIS — E871 Hypo-osmolality and hyponatremia: Secondary | ICD-10-CM

## 2023-04-29 DIAGNOSIS — B20 Human immunodeficiency virus [HIV] disease: Secondary | ICD-10-CM | POA: Diagnosis not present

## 2023-04-29 DIAGNOSIS — E876 Hypokalemia: Secondary | ICD-10-CM

## 2023-04-29 DIAGNOSIS — F332 Major depressive disorder, recurrent severe without psychotic features: Secondary | ICD-10-CM

## 2023-04-29 DIAGNOSIS — R296 Repeated falls: Secondary | ICD-10-CM

## 2023-04-29 DIAGNOSIS — B028 Zoster with other complications: Secondary | ICD-10-CM

## 2023-04-29 DIAGNOSIS — Z21 Asymptomatic human immunodeficiency virus [HIV] infection status: Secondary | ICD-10-CM

## 2023-04-29 DIAGNOSIS — R531 Weakness: Secondary | ICD-10-CM

## 2023-04-29 DIAGNOSIS — L0390 Cellulitis, unspecified: Secondary | ICD-10-CM | POA: Diagnosis not present

## 2023-04-29 DIAGNOSIS — B029 Zoster without complications: Secondary | ICD-10-CM

## 2023-04-29 DIAGNOSIS — W19XXXA Unspecified fall, initial encounter: Secondary | ICD-10-CM

## 2023-04-29 LAB — CBC
HCT: 34.6 % — ABNORMAL LOW (ref 36.0–46.0)
Hemoglobin: 11.8 g/dL — ABNORMAL LOW (ref 12.0–15.0)
MCH: 29.2 pg (ref 26.0–34.0)
MCHC: 34.1 g/dL (ref 30.0–36.0)
MCV: 85.6 fL (ref 80.0–100.0)
Platelets: 112 10*3/uL — ABNORMAL LOW (ref 150–400)
RBC: 4.04 MIL/uL (ref 3.87–5.11)
RDW: 14.6 % (ref 11.5–15.5)
WBC: 3.3 10*3/uL — ABNORMAL LOW (ref 4.0–10.5)
nRBC: 0 % (ref 0.0–0.2)

## 2023-04-29 LAB — BASIC METABOLIC PANEL
Anion gap: 8 (ref 5–15)
BUN: 7 mg/dL (ref 6–20)
CO2: 24 mmol/L (ref 22–32)
Calcium: 8.4 mg/dL — ABNORMAL LOW (ref 8.9–10.3)
Chloride: 105 mmol/L (ref 98–111)
Creatinine, Ser: 0.79 mg/dL (ref 0.44–1.00)
GFR, Estimated: >60 mL/min (ref >60)
Glucose, Bld: 104 mg/dL — ABNORMAL HIGH (ref 70–99)
Potassium: 4.1 mmol/L (ref 3.5–5.1)
Sodium: 137 mmol/L (ref 135–145)

## 2023-04-29 LAB — LACTIC ACID, PLASMA: Lactic Acid, Venous: 1.4 mmol/L (ref 0.5–1.9)

## 2023-04-29 LAB — MAGNESIUM: Magnesium: 1.6 mg/dL — ABNORMAL LOW (ref 1.7–2.4)

## 2023-04-29 LAB — T-HELPER CELLS (CD4) COUNT (NOT AT ARMC)
CD4 % Helper T Cell: 2 % — ABNORMAL LOW (ref 33–65)
CD4 T Cell Abs: <35 /uL — ABNORMAL LOW (ref 400–1790)

## 2023-04-29 MED ORDER — ESCITALOPRAM OXALATE 10 MG PO TABS
5.0000 mg | ORAL_TABLET | Freq: Every day | ORAL | Status: DC
  Administered 2023-04-29 – 2023-04-30 (×2): 5 mg via ORAL
  Filled 2023-04-29 (×2): qty 1

## 2023-04-29 MED ORDER — SODIUM CHLORIDE 0.9 % IV SOLN: 2.0000 g | INTRAVENOUS | Status: DC

## 2023-04-29 MED ORDER — DEXTROSE 5 % IV SOLN
10.0000 mg/kg | Freq: Three times a day (TID) | INTRAVENOUS | Status: DC
  Administered 2023-04-29 – 2023-05-04 (×13): 540 mg via INTRAVENOUS
  Filled 2023-04-29 (×19): qty 10.8

## 2023-04-29 MED ORDER — VALACYCLOVIR HCL 500 MG PO TABS
1000.0000 mg | ORAL_TABLET | Freq: Three times a day (TID) | ORAL | Status: DC
  Administered 2023-04-29: 1000 mg via ORAL
  Filled 2023-04-29 (×3): qty 2

## 2023-04-29 MED ORDER — LACTATED RINGERS IV SOLN: INTRAVENOUS | Status: DC

## 2023-04-29 MED ORDER — OXYCODONE HCL 5 MG PO TABS
5.0000 mg | ORAL_TABLET | Freq: Four times a day (QID) | ORAL | Status: DC | PRN
  Administered 2023-04-29 – 2023-05-04 (×10): 5 mg via ORAL
  Filled 2023-04-29 (×11): qty 1

## 2023-04-29 MED ORDER — CEFAZOLIN SODIUM-DEXTROSE 1-4 GM/50ML-% IV SOLN
1.0000 g | Freq: Three times a day (TID) | INTRAVENOUS | Status: DC
  Administered 2023-04-29 – 2023-05-04 (×14): 1 g via INTRAVENOUS
  Filled 2023-04-29 (×15): qty 50

## 2023-04-29 MED ORDER — HYDROMORPHONE HCL 1 MG/ML IJ SOLN
0.5000 mg | INTRAMUSCULAR | Status: DC | PRN
  Administered 2023-04-29 – 2023-04-30 (×3): 0.5 mg via INTRAVENOUS
  Filled 2023-04-29 (×3): qty 0.5

## 2023-04-29 MED ORDER — POTASSIUM CHLORIDE CRYS ER 20 MEQ PO TBCR
40.0000 meq | EXTENDED_RELEASE_TABLET | Freq: Once | ORAL | Status: AC
  Administered 2023-04-29: 40 meq via ORAL
  Filled 2023-04-29: qty 2

## 2023-04-29 MED ORDER — MAGNESIUM SULFATE 2 GM/50ML IV SOLN
2.0000 g | Freq: Once | INTRAVENOUS | Status: AC
  Administered 2023-04-29: 2 g via INTRAVENOUS
  Filled 2023-04-29: qty 50

## 2023-04-29 MED ORDER — IBUPROFEN 200 MG PO TABS: 200.0000 mg | ORAL_TABLET | Freq: Once | ORAL | Status: DC | PRN

## 2023-04-29 MED ORDER — ENOXAPARIN SODIUM 40 MG/0.4ML IJ SOSY
40.0000 mg | PREFILLED_SYRINGE | INTRAMUSCULAR | Status: DC
  Administered 2023-04-29 – 2023-05-04 (×6): 40 mg via SUBCUTANEOUS
  Filled 2023-04-29 (×6): qty 0.4

## 2023-04-29 MED ORDER — LACTATED RINGERS IV SOLN: INTRAVENOUS | Status: AC

## 2023-04-29 MED ORDER — SODIUM CHLORIDE 0.9 % IV SOLN: 1.0000 g | INTRAVENOUS | Status: DC

## 2023-04-29 MED ORDER — VANCOMYCIN HCL 750 MG/150ML IV SOLN
750.0000 mg | Freq: Two times a day (BID) | INTRAVENOUS | Status: DC
  Filled 2023-04-29: qty 150

## 2023-04-29 MED ORDER — ACETAMINOPHEN 325 MG PO TABS
650.0000 mg | ORAL_TABLET | Freq: Four times a day (QID) | ORAL | Status: DC | PRN
  Administered 2023-04-29 – 2023-05-02 (×6): 650 mg via ORAL
  Filled 2023-04-29 (×6): qty 2

## 2023-04-29 MED ORDER — IBUPROFEN 400 MG PO TABS
400.0000 mg | ORAL_TABLET | Freq: Once | ORAL | Status: AC | PRN
  Administered 2023-04-29: 400 mg via ORAL
  Filled 2023-04-29: qty 1

## 2023-04-29 MED ORDER — VANCOMYCIN HCL 750 MG/150ML IV SOLN: 750.0000 mg | INTRAVENOUS | Status: DC
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
## 2023-04-29 NOTE — H&P (Signed)
History and Physical    Christina [name] ZOX:[id number] DOB: Oct 29, 1970 DOA: 04/28/2023  PCP: Pcp, No  Patient coming from: Home  Chief Complaint: Generalized weakness  HPI: Christina [name] is a 52 y.o. female with medical history significant of depression, anxiety, chronic back pain, DVT in 2018, marijuana and tobacco abuse, homelessness.  She was seen in the ED yesterday for left lower chest/upper abdomen/flank blistering painful rash consistent with shingles and prescribed valacyclovir and discharged.  She was not having a fever at that time.  She did test positive for HIV yesterday.    She returns to the ED today for evaluation of fever, tachycardia, generalized weakness, and falls.  Temperature 103 F on arrival and tachycardic to the 120s.  Not hypotensive or hypoxic.  Noted to have left chest wall rash consistent with shingles with superimposed cellulitis.  Labs notable for WBC 2.7, sodium 133, potassium 3.4, bicarb 20, glucose 113, creatinine 1.1, COVID/influenza/RSV PCR negative, blood cultures collected, lactic acid normal x 3, UA not suggestive of infection, CD4 count pending.  Chest x-ray not suggestive of pneumonia. Patient was given Tylenol, vancomycin, ceftriaxone, and 2 L LR.  TRH called to admit.  Patient states she started having a rash on her left lower chest/underneath the left breast a few days ago which is painful.  States she was seen at [facility] emergency room yesterday and prescribed a medication but continues to feel very weak.  She has had 2 falls due to generalized weakness but denies any head injury or loss of consciousness.  Denies any other injuries from the falls.  She is not sure if she had any fevers.  Denies cough, shortness of breath, chest pain, vomiting, abdominal pain, diarrhea, or any urinary symptoms.  She is homeless.  Denies history of IV drug use.  States she used to be sexually active with the same partner for 9 years and sometimes engaged in unprotected  sex.  To her knowledge, her partner did not have HIV.  Patient states she has not engaged in any sexual activity for over a year.  Review of Systems:  Review of Systems  All other systems reviewed and are negative.   Past Medical History:  Diagnosis Date   Anxiety    Back pain    DVT (deep venous thrombosis) (HCC)    left leg with stent    Past Surgical History:  Procedure Laterality Date   CORONARY ULTRASOUND/IVUS Left 11/03/2016   Procedure: Intravascular Ultrasound/IVUS;  Surgeon: Maeola Harman, MD;  Location: [facility] INVASIVE CV LAB;  Service: Cardiovascular;  Laterality: Left;   LOWER EXTREMITY VENOGRAPHY Left 11/04/2016   Procedure: Lower Extremity Venography;  Surgeon: Nada Libman, MD;  Location: [facility] INVASIVE CV LAB;  Service: Cardiovascular;  Laterality: Left;   PERIPHERAL VASCULAR INTERVENTION Left 11/04/2016   Procedure: Peripheral Vascular Intervention;  Surgeon: Nada Libman, MD;  Location: [facility] INVASIVE CV LAB;  Service: Cardiovascular;  Laterality: Left;  common external   PERIPHERAL VASCULAR THROMBECTOMY Left 11/03/2016   Procedure: Peripheral Vascular Thrombectomy;  Surgeon: Maeola Harman, MD;  Location[facility] INVASIVE CV LAB;  Service: Cardiovascular;  Laterality: Left;     reports that she has been smoking cigarettes. She has never used smokeless tobacco. She reports current alcohol use. She reports current drug use. Drug: Marijuana.  No Known Allergies  History reviewed. No pertinent family history.  Prior to Admission medications   Medication Sig Start Date End Date Taking? Authorizing Provider  acetaminophen (TYLENOL) 325 MG tablet  Take 2 tablets (650 mg total) by mouth every 6 (six) hours as needed for mild pain, moderate pain or headache. 10/28/18   Aldean Baker, NP  DULoxetine (CYMBALTA) 30 MG capsule Take 3 capsules (90 mg total) by mouth daily. For mood 10/29/18   Aldean Baker, NP  furosemide (LASIX) 40 MG tablet Take 1 tablet (40 mg total)  by mouth daily. For swelling 10/29/18   Aldean Baker, NP  gabapentin (NEURONTIN) 400 MG capsule Take 1 capsule (400 mg total) by mouth 3 (three) times daily. For pain 10/28/18   Aldean Baker, NP  hydrOXYzine (ATARAX/VISTARIL) 25 MG tablet Take 1 tablet (25 mg total) by mouth every 6 (six) hours as needed for anxiety. 10/28/18   Aldean Baker, NP  nicotine (NICODERM CQ - DOSED IN MG/24 HOURS) 21 mg/24hr patch Place 1 patch (21 mg total) onto the skin daily. For smoking cessation 10/28/18   Aldean Baker, NP  oxyCODONE-acetaminophen (PERCOCET/ROXICET) 5-325 MG tablet Take 1-2 tablets by mouth every 8 (eight) hours as needed for severe pain. 04/27/23   Benjiman Core, MD  prazosin (MINIPRESS) 1 MG capsule Take 1 capsule (1 mg total) by mouth at bedtime. For nightmares 10/28/18   Aldean Baker, NP  rivaroxaban (XARELTO) 20 MG TABS tablet Take 1 tablet (20 mg total) by mouth daily. For DVT prevention 10/29/18   Aldean Baker, NP  traZODone (DESYREL) 100 MG tablet Take 1 tablet (100 mg total) by mouth at bedtime as needed for sleep. 10/28/18   Aldean Baker, NP  valACYclovir (VALTREX) 1000 MG tablet Take 1 tablet (1,000 mg total) by mouth 3 (three) times daily. 04/27/23   Benjiman Core, MD    Physical Exam: Vitals:   04/28/23 1937 04/28/23 1941 04/28/23 2111 04/28/23 2143  BP: (!) 142/94  (!) 142/99 (!) 134/95  Pulse: (!) 125  (!) 122 (!) 117  Resp: 20  (!) 22 14  Temp: (!) 103 F (39.4 C)   99.7 F (37.6 C)  TempSrc: Oral   Oral  SpO2: 98%  98% 97%  Weight:  71 kg    Height:  5\' 6"  (1.676 m)      Physical Exam Vitals reviewed.  Constitutional:      General: She is not in acute distress. HENT:     Head: Normocephalic and atraumatic.  Eyes:     Extraocular Movements: Extraocular movements intact.  Cardiovascular:     Rate and Rhythm: Normal rate and regular rhythm.     Pulses: Normal pulses.  Pulmonary:     Effort: Pulmonary effort is normal. No respiratory distress.     Breath sounds:  Normal breath sounds. No wheezing or rales.  Abdominal:     General: Bowel sounds are normal. There is no distension.     Palpations: Abdomen is soft.     Tenderness: There is no abdominal tenderness.  Musculoskeletal:     Cervical back: Normal range of motion. No rigidity.     Right lower leg: No edema.     Left lower leg: No edema.  Skin:    General: Skin is warm and dry.     Findings: Rash present.     Comments: Blistering rash with surrounding erythema noted on the left lower chest/upper abdomen/left flank area.  Neurological:     General: No focal deficit present.     Mental Status: She is alert and oriented to person, place, and time.     Labs on  Admission: I have personally reviewed following labs and imaging studies  CBC: Recent Labs  Lab 04/27/23 0957 04/28/23 2008  WBC 2.9* 2.7*  NEUTROABS 2.1 1.8  HGB 14.0 12.7  HCT 42.5 37.6  MCV 86.7 84.9  PLT 162 154   Basic Metabolic Panel: Recent Labs  Lab 04/27/23 0957 04/28/23 2008  NA 134* 133*  K 3.5 3.4*  CL 102 102  CO2 22 20*  GLUCOSE 86 113*  BUN 15 12  CREATININE 0.93 1.11*  CALCIUM 8.8* 8.7*   GFR: Estimated Creatinine Clearance: 55.5 mL/min (A) (by C-G formula based on SCr of 1.11 mg/dL (H)). Liver Function Tests: Recent Labs  Lab 04/28/23 2008  AST 28  ALT 14  ALKPHOS 75  BILITOT 0.7  PROT 9.2*  ALBUMIN 3.4*   No results for input(s): "LIPASE", "AMYLASE" in the last 168 hours. No results for input(s): "AMMONIA" in the last 168 hours. Coagulation Profile: Recent Labs  Lab 04/28/23 2008  INR 1.0   Cardiac Enzymes: No results for input(s): "CKTOTAL", "CKMB", "CKMBINDEX", "TROPONINI" in the last 168 hours. BNP (last 3 results) No results for input(s): "PROBNP" in the last 8760 hours. HbA1C: No results for input(s): "HGBA1C" in the last 72 hours. CBG: No results for input(s): "GLUCAP" in the last 168 hours. Lipid Profile: No results for input(s): "CHOL", "HDL", "LDLCALC", "TRIG",  "CHOLHDL", "LDLDIRECT" in the last 72 hours. Thyroid Function Tests: No results for input(s): "TSH", "T4TOTAL", "FREET4", "T3FREE", "THYROIDAB" in the last 72 hours. Anemia Panel: No results for input(s): "VITAMINB12", "FOLATE", "FERRITIN", "TIBC", "IRON", "RETICCTPCT" in the last 72 hours. Urine analysis:    Component Value Date/Time   COLORURINE YELLOW 04/28/2023 2253   APPEARANCEUR HAZY (A) 04/28/2023 2253   LABSPEC 1.015 04/28/2023 2253   PHURINE 6.0 04/28/2023 2253   GLUCOSEU NEGATIVE 04/28/2023 2253   HGBUR NEGATIVE 04/28/2023 2253   BILIRUBINUR NEGATIVE 04/28/2023 2253   KETONESUR NEGATIVE 04/28/2023 2253   PROTEINUR NEGATIVE 04/28/2023 2253   UROBILINOGEN 0.2 01/13/2018 1514   NITRITE NEGATIVE 04/28/2023 2253   LEUKOCYTESUR NEGATIVE 04/28/2023 2253    Radiological Exams on Admission: DG Chest Port 1 View  Result Date: 04/28/2023 CLINICAL DATA:  Questionable sepsis - evaluate for abnormality Fall at home.  Weakness. EXAM: PORTABLE CHEST 1 VIEW COMPARISON:  Chest CT 11/03/2016 FINDINGS: The cardiomediastinal contours are normal. The lungs are clear. Pulmonary vasculature is normal. No consolidation, pleural effusion, or pneumothorax. No acute osseous abnormalities are seen. IMPRESSION: Negative radiograph of the chest. Electronically Signed   By: Narda Rutherford M.D.   On: 04/28/2023 21:31    EKG: Independently reviewed.  Sinus tachycardia.  Assessment and Plan  Sepsis secondary to herpes zoster rash with superimposed cellulitis Patient is presenting with left lower chest/upper abdomen/left flank area blistering painful rash consistent with herpes zoster with superimposed cellulitis.  No other obvious infectious source.  She is immunocompromised given new diagnosis of HIV.  Presenting with fever and tachycardia.  WBC count 2.7.  No lactic acidosis or hypotension to suggest severe sepsis.  Continue vancomycin and ceftriaxone for cellulitis.  Continue valacyclovir for herpes  zoster.  Tylenol as needed for fevers.  Patient was given 2 L IV fluids in the ED and still slightly tachycardic.  Continue IV fluid hydration.  Blood cultures pending.  Monitor WBC count.  Placed on airborne and contact precautions.  Consult ID in the morning.  HIV positive HIV antibody positive during ED visit yesterday and this is a new diagnosis.  Patient is homeless.  She denies IV drug use.  Denies any sexual activity for over a year and previously sexually active with the same partner for 9 years but sometimes engaged in unprotected sex.  CD4 count pending.  Also check viral load.  Consult ID in the morning.  Generalized weakness/falls Patient denies head injury or loss of consciousness.  Denies any other injuries from the fall. PT/OT eval, fall precautions.  Borderline hyponatremia Receiving IV fluids and continue to monitor BMP.  Mild hypokalemia Monitor potassium and magnesium levels, continue to replace as needed.  Mild normal anion gap metabolic acidosis Continue IV fluid hydration and monitor BMP.  Depression and anxiety Pharmacy med rec pending.  DVT prophylaxis: Xarelto listed in her home medications but unclear whether this is a current medication.  History of DVT in 2018 but no PE.  Pharmacy med rec pending. If no longer on Xarelto, then Lovenox can be ordered for DVT prophylaxis. Code Status: Full Code (discussed with the patient) Level of care: Telemetry bed Admission status: It is my clinical opinion that admission to INPATIENT is reasonable and necessary because of the expectation that this patient will require hospital care that crosses at least 2 midnights to treat this condition based on the medical complexity of the problems presented.  Given the aforementioned information, the predictability of an adverse outcome is felt to be significant.  John Giovanni MD [facility]  If 7PM-7AM, please contact night-coverage [email or web address]  04/29/2023, 12:10 AM

## 2023-04-29 NOTE — Progress Notes (Signed)
Pt is no longer taking Xarelto. D/w Dr Renford Dills and we will add Lovenox for DVT prophylaxis.  Ulyses Southward, PharmD, BCIDP, AAHIVP, CPP Infectious Disease Pharmacist 04/29/2023 1:15 PM

## 2023-04-29 NOTE — Progress Notes (Signed)
   04/29/23 1200  Spiritual Encounters  Type of Visit Initial  Care provided to: Patient  Referral source Patient request  Reason for visit Routine spiritual support  OnCall Visit No  Spiritual Framework  Presenting Themes Meaning/purpose/sources of inspiration  Needs/Challenges/Barriers lack of shelter  Patient Stress Factors Financial concerns;Health changes;Lack of knowledge;Loss of control  Goals  Self/Personal Goals getting out of the street  Interventions  Spiritual Care Interventions Made Compassionate presence;Reflective listening;Normalization of emotions;Meaning making  Intervention Outcomes  Outcomes Reduced isolation;Connection to spiritual care   Ch responded to request for emotional and spiritual support. There was no family present at bedside. Pt said the biggest challenge she is facing is lack of housing. She lives on the street and she would like to find a place to live. She does not have enough resources to rent a home. Pt has four children and 5 grandchildren. They live in [place] and [place]. When asked why she does not go live with them? She said she does not want to be a burden to anyone. Ch asked guided questions to help pt process her thoughts and feelings and referred her to the Child psychotherapist. Ch remains available when needed.

## 2023-04-29 NOTE — ED Notes (Signed)
Transport requested for patient to go to the floor
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
## 2023-04-29 NOTE — ED Notes (Signed)
ED TO INPATIENT HANDOFF REPORT  ED Nurse Name and Phone #: Pearletha Forge RN [phone]  S Name/Age/Gender Christina [name] 52 y.o. female Room/Bed: 005C/005C  Code Status   Code Status: Prior  Home/SNF/Other Home Patient oriented to: self, place, time, and situation Is this baseline? Yes   Triage Complete: Triage complete  Chief Complaint Cellulitis [L03.90]  Triage Note Patient from home with c/o fall x2 at home today per EMS. EMS states patient slide out of bed did not hit head, no LOC however c/o of increased weakness. Dx w/ shingles yesterday at [facility]. Per EMS patient with facial droop to the right [name] and slurred speech but this has been occurring for a year and no new deficits. BP 130/90 CBG 91 HR 90 Spo2-100% on RA.    Allergies No Known Allergies  Level of Care/Admitting Diagnosis ED Disposition     ED Disposition  Admit   Condition  --   Comment  Hospital Area: [facility] [[id number]]  Level of Care: Telemetry Medical [104]  May admit patient to [facility] or [facility] if equivalent level of care is available:: Yes  Covid Evaluation: Asymptomatic - no recent exposure (last 10 days) testing not required  Diagnosis: Cellulitis [454098]  Admitting Physician: John Giovanni [[id number]]  Attending Physician: John Giovanni [[id number]]  Certification:: I certify this patient will need inpatient services for at least 2 midnights  Expected Medical Readiness: 04/30/2023          B Medical/Surgery History Past Medical History:  Diagnosis Date   Anxiety    Back pain    DVT (deep venous thrombosis) (HCC)    left leg with stent   Past Surgical History:  Procedure Laterality Date   CORONARY ULTRASOUND/IVUS Left 11/03/2016   Procedure: Intravascular Ultrasound/IVUS;  Surgeon: Maeola Harman, MD;  Location: [facility] INVASIVE CV LAB;  Service: Cardiovascular;  Laterality: Left;   LOWER EXTREMITY VENOGRAPHY Left 11/04/2016   Procedure: Lower  Extremity Venography;  Surgeon: Nada Libman, MD;  Location: [facility] INVASIVE CV LAB;  Service: Cardiovascular;  Laterality: Left;   PERIPHERAL VASCULAR INTERVENTION Left 11/04/2016   Procedure: Peripheral Vascular Intervention;  Surgeon: Nada Libman, MD;  Location: [facility] INVASIVE CV LAB;  Service: Cardiovascular;  Laterality: Left;  common external   PERIPHERAL VASCULAR THROMBECTOMY Left 11/03/2016   Procedure: Peripheral Vascular Thrombectomy;  Surgeon: Maeola Harman, MD;  Location: [facility] INVASIVE CV LAB;  Service: Cardiovascular;  Laterality: Left;     A IV Location/Drains/Wounds Patient Lines/Drains/Airways Status     Active Line/Drains/Airways     Name Placement date Placement time Site Days   Peripheral IV 04/28/23 20 G Right Antecubital 04/28/23  2014  Antecubital  1   Peripheral IV 04/28/23 20 G Anterior;Left Forearm 04/28/23  2155  Forearm  1   Sheath 11/03/16 Left Venous 11/03/16  --  Venous  2368            Intake/Output Last 24 hours No intake or output data in the 24 hours ending 04/29/23 0035  Labs/Imaging Results for orders placed or performed during the hospital encounter of 04/28/23 (from the past 48 hour(s))  Resp panel by RT-PCR (RSV, Flu A&B, Covid) Anterior Nasal Swab     Status: None   Collection Time: 04/28/23  8:04 PM   Specimen: Anterior Nasal Swab  Result Value Ref Range   SARS Coronavirus 2 by RT PCR NEGATIVE NEGATIVE   Influenza A by PCR NEGATIVE NEGATIVE   Influenza B by PCR  NEGATIVE NEGATIVE    Comment: (NOTE) The Xpert Xpress SARS-CoV-2/FLU/RSV plus assay is intended as an aid in the diagnosis of influenza from Nasopharyngeal swab specimens and should not be used as a sole basis for treatment. Nasal washings and aspirates are unacceptable for Xpert Xpress SARS-CoV-2/FLU/RSV testing.  Fact Sheet for Patients: BloggerCourse.com  Fact Sheet for Healthcare  Providers: SeriousBroker.it  This test is not yet approved or cleared by the [place] FDA and has been authorized for detection and/or diagnosis of SARS-CoV-2 by FDA under an Emergency Use Authorization (EUA). This EUA will remain in effect (meaning this test can be used) for the duration of the COVID-19 declaration under Section 564(b)(1) of the Act, 21 U.S.C. section 360bbb-3(b)(1), unless the authorization is terminated or revoked.     Resp Syncytial Virus by PCR NEGATIVE NEGATIVE    Comment: (NOTE) Fact Sheet for Patients: BloggerCourse.com  Fact Sheet for Healthcare Providers: SeriousBroker.it  This test is not yet approved or cleared by the [place] FDA and has been authorized for detection and/or diagnosis of SARS-CoV-2 by FDA under an Emergency Use Authorization (EUA). This EUA will remain in effect (meaning this test can be used) for the duration of the COVID-19 declaration under Section 564(b)(1) of the Act, 21 U.S.C. section 360bbb-3(b)(1), unless the authorization is terminated or revoked.  Performed at [facility], [address], [place], [place] [address]   Comprehensive metabolic panel     Status: Abnormal   Collection Time: 04/28/23  8:08 PM  Result Value Ref Range   Sodium 133 (L) 135 - 145 mmol/L   Potassium 3.4 (L) 3.5 - 5.1 mmol/L   Chloride 102 98 - 111 mmol/L   CO2 20 (L) 22 - 32 mmol/L   Glucose, Bld 113 (H) 70 - 99 mg/dL    Comment: Glucose reference range applies only to samples taken after fasting for at least 8 hours.   BUN 12 6 - 20 mg/dL   Creatinine, Ser 3.32 (H) 0.44 - 1.00 mg/dL   Calcium 8.7 (L) 8.9 - 10.3 mg/dL   Total Protein 9.2 (H) 6.5 - 8.1 g/dL   Albumin 3.4 (L) 3.5 - 5.0 g/dL   AST 28 15 - 41 U/L   ALT 14 0 - 44 U/L   Alkaline Phosphatase 75 38 - 126 U/L   Total Bilirubin 0.7 0.3 - 1.2 mg/dL   GFR, Estimated 60 (L) >60 mL/min    Comment:  (NOTE) Calculated using the CKD-EPI Creatinine Equation (2021)    Anion gap 11 5 - 15    Comment: Performed at [facility], [address], [place], [place] [address]  CBC with Differential     Status: Abnormal   Collection Time: 04/28/23  8:08 PM  Result Value Ref Range   WBC 2.7 (L) 4.0 - 10.5 K/uL   RBC 4.43 3.87 - 5.11 MIL/uL   Hemoglobin 12.7 12.0 - 15.0 g/dL   HCT 41.6 60.6 - 30.1 %   MCV 84.9 80.0 - 100.0 fL   MCH 28.7 26.0 - 34.0 pg   MCHC 33.8 30.0 - 36.0 g/dL   RDW 60.1 09.3 - 23.5 %   Platelets 154 150 - 400 K/uL   nRBC 0.0 0.0 - 0.2 %   Neutrophils Relative % 68 %   Neutro Abs 1.8 1.7 - 7.7 K/uL   Lymphocytes Relative 25 %   Lymphs Abs 0.7 0.7 - 4.0 K/uL   Monocytes Relative 6 %   Monocytes  Absolute 0.2 0.1 - 1.0 K/uL   Eosinophils Relative 0 %   Eosinophils Absolute 0.0 0.0 - 0.5 K/uL   Basophils Relative 0 %   Basophils Absolute 0.0 0.0 - 0.1 K/uL   Immature Granulocytes 1 %   Abs Immature Granulocytes 0.02 0.00 - 0.07 K/uL    Comment: Performed at [facility], [address], [place], [place] [address]  Protime-INR     Status: None   Collection Time: 04/28/23  8:08 PM  Result Value Ref Range   Prothrombin Time 13.1 11.4 - 15.2 seconds   INR 1.0 0.8 - 1.2    Comment: (NOTE) INR goal varies based on device and disease states. Performed at [facility], [address], [place], [place] [address]   APTT     Status: None   Collection Time: 04/28/23  8:08 PM  Result Value Ref Range   aPTT 27 24 - 36 seconds    Comment: Performed at [facility], [address], [place], [place] [address]  hCG, serum, qualitative     Status: None   Collection Time: 04/28/23  8:08 PM  Result Value Ref Range   Preg, Serum NEGATIVE NEGATIVE    Comment:        THE SENSITIVITY OF THIS METHODOLOGY IS >10 mIU/mL. Performed at [facility], [address] [place], [place] [address]   I-Stat Lactic Acid, ED     Status: None   Collection Time:  04/28/23  8:23 PM  Result Value Ref Range   Lactic Acid, Venous 1.7 0.5 - 1.9 mmol/L  Lactic acid, plasma     Status: None   Collection Time: 04/28/23  9:47 PM  Result Value Ref Range   Lactic Acid, Venous 1.5 0.5 - 1.9 mmol/L    Comment: Performed at [facility], [address], [place], [place] [address]  I-Stat Lactic Acid, ED     Status: None   Collection Time: 04/28/23 10:30 PM  Result Value Ref Range   Lactic Acid, Venous 1.6 0.5 - 1.9 mmol/L  Urinalysis, w/ Reflex to Culture (Infection Suspected) -Urine, Clean Catch     Status: Abnormal   Collection Time: 04/28/23 10:53 PM  Result Value Ref Range   Specimen Source URINE, CLEAN CATCH    Color, Urine YELLOW YELLOW   APPearance HAZY (A) CLEAR   Specific Gravity, Urine 1.015 1.005 - 1.030   pH 6.0 5.0 - 8.0   Glucose, UA NEGATIVE NEGATIVE mg/dL   Hgb urine dipstick NEGATIVE NEGATIVE   Bilirubin Urine NEGATIVE NEGATIVE   Ketones, ur NEGATIVE NEGATIVE mg/dL   Protein, ur NEGATIVE NEGATIVE mg/dL   Nitrite NEGATIVE NEGATIVE   Leukocytes,Ua NEGATIVE NEGATIVE    Comment: Performed at [facility], [address] [place], [place] [address]  Lactic acid, plasma     Status: None   Collection Time: 04/28/23 10:53 PM  Result Value Ref Range   Lactic Acid, Venous 1.4 0.5 - 1.9 mmol/L    Comment: Performed at [facility], [address] [place], [place] [address]   DG Chest Port 1 View  Result Date: 04/28/2023 CLINICAL DATA:  Questionable sepsis - evaluate for abnormality Fall at home.  Weakness. EXAM: PORTABLE CHEST 1 VIEW COMPARISON:  Chest CT 11/03/2016 FINDINGS: The cardiomediastinal contours are normal. The lungs are clear. Pulmonary vasculature is normal. No consolidation, pleural effusion, or pneumothorax. No acute osseous abnormalities are seen. IMPRESSION: Negative radiograph of  the chest. Electronically Signed   By: Narda Rutherford M.D.   On: 04/28/2023 21:31    Pending Labs Unresulted Labs (From  admission, onward)     Start     Ordered   04/28/23 2200  T-helper cells (CD4) count (not at [facility])  Once,   URGENT        04/28/23 2159   04/28/23 1937  Blood Culture (routine x 2)  (Septic presentation on arrival (screening labs, nursing and treatment orders for obvious sepsis))  BLOOD CULTURE X 2,   STAT      04/28/23 1938            Vitals/Pain Today's Vitals   04/28/23 2338 04/28/23 2345 04/29/23 0000 04/29/23 0006  BP:  (!) 141/98 (!) 136/93   Pulse:      Resp:  (!) 23 (!) 22   Temp:      TempSrc:      SpO2:      Weight:      Height:      PainSc: 0-No pain   0-No pain    Isolation Precautions Airborne and Contact precautions  Medications Medications  lactated ringers infusion ( Intravenous New Bag/Given 04/28/23 2337)  lactated ringers bolus 1,000 mL (0 mLs Intravenous Stopped 04/28/23 2237)    And  lactated ringers bolus 1,000 mL (0 mLs Intravenous Stopped 04/28/23 2237)  cefTRIAXone (ROCEPHIN) 2 g in sodium chloride 0.9 % 100 mL IVPB (0 g Intravenous Stopped 04/28/23 2157)  vancomycin (VANCOREADY) IVPB 1500 mg/300 mL (0 mg Intravenous Stopped 04/29/23 0007)  acetaminophen (TYLENOL) tablet 650 mg (650 mg Oral Given 04/28/23 2102)    Mobility walks     Focused Assessments Cardiac Assessment Handoff:  Cardiac Rhythm: Sinus tachycardia No results found for: "CKTOTAL", "CKMB", "CKMBINDEX", "TROPONINI" No results found for: "DDIMER" Does the Patient currently have chest pain? No    R Recommendations: See Admitting Provider Note  Report given to:   Additional Notes:

## 2023-04-29 NOTE — Progress Notes (Signed)
Pharmacy Antimicrobial Note  Christina [name] is a 52 y.o. female admitted on 04/28/2023 with  generalized weakness, likely shingles, possible dissemination .  Pharmacy has been consulted for acyclovir dosing. ID following patient.  Plan: Start acyclovir 540 mg (10 mg/kg) IV q8h  Continue hydration with LR @ 125 mL/hr Monitor BMP daily  Height: 5\' 6"  (167.6 cm) Weight: 54.2 kg (119 lb 7.8 oz) IBW/kg (Calculated) : 59.3  Temp (24hrs), Avg:100 F (37.8 C), Min:98 F (36.7 C), Max:103 F (39.4 C)  Recent Labs  Lab 04/27/23 0957 04/28/23 2008 04/28/23 2023 04/28/23 2147 04/28/23 2230 04/28/23 2253 04/29/23 0824 04/29/23 1228  WBC 2.9* 2.7*  --   --   --   --  3.3*  --   CREATININE 0.93 1.11*  --   --   --   --   --  0.79  LATICACIDVEN  --   --  1.7 1.5 1.6 1.4  --   --     Estimated Creatinine Clearance: 70.4 mL/min (by C-G formula based on SCr of 0.79 mg/dL).    No Known Allergies  Microbiology results: 9/2 HIV 4th Gen: Reactive - follow up testing pending  Thank you for allowing pharmacy to be a part of this patient's care.  Lora Paula, PharmD PGY-2 Infectious Diseases Pharmacy Resident 04/29/2023 2:41 PM

## 2023-04-29 NOTE — Progress Notes (Signed)
Assisted primary nurse d/t patient being found on floor in room. No visible injuries. Vitals consistent with baseline. Patient stated that she attempted to ambulate to bathroom. Patient assisted to [facility] but did not void. No incontinent void noted. Patient assisted back in bed. Dr. Renford Dills notified.

## 2023-04-29 NOTE — Plan of Care (Signed)
  Problem: Pain Managment: Goal: General experience of comfort will improve Outcome: Progressing   Problem: Skin Integrity: Goal: Skin integrity will improve 04/29/2023 2225 by Jaynee Eagles, RN Outcome: Progressing 04/29/2023 2225 by Jaynee Eagles, RN Outcome: Progressing   Problem: Clinical Measurements: Goal: Ability to avoid or minimize complications of infection will improve Outcome: Progressing   Problem: Safety: Goal: Ability to remain free from injury will improve 04/29/2023 2225 by Jaynee Eagles, RN Outcome: Progressing 04/29/2023 2225 by Jaynee Eagles, RN Outcome: Progressing
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
## 2023-04-29 NOTE — Progress Notes (Signed)
   04/29/23 2250  Assess: MEWS Score  Temp 99.1 F (37.3 C)  BP (!) 124/91  MAP (mmHg) 101  Pulse Rate (!) 126  Resp 18  SpO2 99 %  Assess: MEWS Score  MEWS Temp 0  MEWS Systolic 0  MEWS Pulse 2  MEWS RR 0  MEWS LOC 0  MEWS Score 2  MEWS Score Color Yellow  Assess: SIRS CRITERIA  SIRS Temperature  0  SIRS Pulse 1  SIRS Respirations  0  SIRS WBC 0  SIRS Score Sum  1   Notified Charge RN, Olegario Messier.
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
## 2023-04-29 NOTE — Progress Notes (Addendum)
Pharmacy Antibiotic Note  Christina [name] is a 52 y.o. female admitted on 04/28/2023 with cellulitis.  Pharmacy has been consulted for vancomycin dosing.  Scr 1.11  Plan: Vancomycin 750mg  IV q24.  Expected AUC 416, scr 1.11 Levels if needed  Height: 5\' 6"  (167.6 cm) Weight: 54.2 kg (119 lb 7.8 oz) IBW/kg (Calculated) : 59.3  Temp (24hrs), Avg:101.2 F (38.4 C), Min:99.7 F (37.6 C), Max:103 F (39.4 C)  Recent Labs  Lab 04/27/23 0957 04/28/23 2008 04/28/23 2023 04/28/23 2147 04/28/23 2230 04/28/23 2253  WBC 2.9* 2.7*  --   --   --   --   CREATININE 0.93 1.11*  --   --   --   --   LATICACIDVEN  --   --  1.7 1.5 1.6 1.4    Estimated Creatinine Clearance: 50.7 mL/min (A) (by C-G formula based on SCr of 1.11 mg/dL (H)).    No Known Allergies  Ulyses Southward, PharmD, BCIDP, AAHIVP, CPP Infectious Disease Pharmacist 04/29/2023 9:32 AM

## 2023-04-29 NOTE — Plan of Care (Signed)
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
## 2023-04-30 DIAGNOSIS — L0390 Cellulitis, unspecified: Secondary | ICD-10-CM

## 2023-04-30 DIAGNOSIS — F331 Major depressive disorder, recurrent, moderate: Secondary | ICD-10-CM

## 2023-04-30 LAB — CBC
HCT: 29.8 % — ABNORMAL LOW (ref 36.0–46.0)
Hemoglobin: 10 g/dL — ABNORMAL LOW (ref 12.0–15.0)
MCH: 28.2 pg (ref 26.0–34.0)
MCHC: 33.6 g/dL (ref 30.0–36.0)
MCV: 83.9 fL (ref 80.0–100.0)
Platelets: 111 10*3/uL — ABNORMAL LOW (ref 150–400)
RBC: 3.55 MIL/uL — ABNORMAL LOW (ref 3.87–5.11)
RDW: 14.3 % (ref 11.5–15.5)
WBC: 2 10*3/uL — ABNORMAL LOW (ref 4.0–10.5)
nRBC: 0 % (ref 0.0–0.2)

## 2023-04-30 LAB — HIV-1/2 AB - DIFFERENTIATION
HIV 1 Ab: REACTIVE
HIV 2 Ab: NONREACTIVE

## 2023-04-30 LAB — BASIC METABOLIC PANEL
Anion gap: 7 (ref 5–15)
BUN: 5 mg/dL — ABNORMAL LOW (ref 6–20)
CO2: 24 mmol/L (ref 22–32)
Calcium: 7.6 mg/dL — ABNORMAL LOW (ref 8.9–10.3)
Chloride: 100 mmol/L (ref 98–111)
Creatinine, Ser: 0.82 mg/dL (ref 0.44–1.00)
GFR, Estimated: >60 mL/min (ref >60)
Glucose, Bld: 93 mg/dL (ref 70–99)
Potassium: 3.4 mmol/L — ABNORMAL LOW (ref 3.5–5.1)
Sodium: 131 mmol/L — ABNORMAL LOW (ref 135–145)

## 2023-04-30 LAB — HEPATITIS B SURFACE ANTIGEN: Hepatitis B Surface Ag: NONREACTIVE

## 2023-04-30 LAB — HEPATITIS B CORE ANTIBODY, TOTAL: Hep B Core Total Ab: NONREACTIVE

## 2023-04-30 LAB — RPR: RPR Ser Ql: NONREACTIVE

## 2023-04-30 MED ORDER — POTASSIUM CHLORIDE CRYS ER 20 MEQ PO TBCR
40.0000 meq | EXTENDED_RELEASE_TABLET | Freq: Once | ORAL | Status: AC
  Administered 2023-04-30: 40 meq via ORAL
  Filled 2023-04-30: qty 2

## 2023-04-30 MED ORDER — ESCITALOPRAM OXALATE 10 MG PO TABS
10.0000 mg | ORAL_TABLET | Freq: Every day | ORAL | Status: DC
  Administered 2023-04-30 – 2023-05-05 (×6): 10 mg via ORAL
  Filled 2023-04-30 (×5): qty 1

## 2023-04-30 MED ORDER — ESCITALOPRAM OXALATE 10 MG PO TABS: 10.0000 mg | ORAL_TABLET | Freq: Every day | ORAL | Status: DC

## 2023-04-30 NOTE — Plan of Care (Signed)
Progressing with details of ailments

## 2023-04-30 NOTE — Plan of Care (Signed)
  Problem: Clinical Measurements: Goal: Ability to avoid or minimize complications of infection will improve 04/30/2023 2248 by Baldwin Jamaica, LPN Outcome: Progressing 04/30/2023 2248 by Baldwin Jamaica, LPN Outcome: Progressing   Problem: Skin Integrity: Goal: Skin integrity will improve 04/30/2023 2248 by Baldwin Jamaica, LPN Outcome: Progressing 04/30/2023 2248 by Baldwin Jamaica, LPN Outcome: Progressing   Problem: Education: Goal: Knowledge of General Education information will improve Description: Including pain rating scale, medication(s)/side effects and non-pharmacologic comfort measures 04/30/2023 2248 by Baldwin Jamaica, LPN Outcome: Progressing 04/30/2023 2248 by Baldwin Jamaica, LPN Outcome: Progressing   Problem: Health Behavior/Discharge Planning: Goal: Ability to manage health-related needs will improve 04/30/2023 2248 by Baldwin Jamaica, LPN Outcome: Progressing 04/30/2023 2248 by Baldwin Jamaica, LPN Outcome: Progressing   Problem: Clinical Measurements: Goal: Ability to maintain clinical measurements within normal limits will improve 04/30/2023 2248 by Baldwin Jamaica, LPN Outcome: Progressing 04/30/2023 2248 by Baldwin Jamaica, LPN Outcome: Progressing Goal: Will remain free from infection 04/30/2023 2248 by Baldwin Jamaica, LPN Outcome: Progressing 04/30/2023 2248 by Baldwin Jamaica, LPN Outcome: Progressing Goal: Diagnostic test results will improve 04/30/2023 2248 by Baldwin Jamaica, LPN Outcome: Progressing 04/30/2023 2248 by Baldwin Jamaica, LPN Outcome: Progressing Goal: Respiratory complications will improve 04/30/2023 2248 by Baldwin Jamaica, LPN Outcome: Progressing 04/30/2023 2248 by Baldwin Jamaica, LPN Outcome: Progressing Goal: Cardiovascular complication will be avoided 04/30/2023 2248 by Baldwin Jamaica, LPN Outcome: Progressing 04/30/2023 2248 by Baldwin Jamaica, LPN Outcome: Progressing   Problem: Activity: Goal: Risk for activity  intolerance will decrease 04/30/2023 2248 by Baldwin Jamaica, LPN Outcome: Progressing 04/30/2023 2248 by Baldwin Jamaica, LPN Outcome: Progressing   Problem: Nutrition: Goal: Adequate nutrition will be maintained 04/30/2023 2248 by Baldwin Jamaica, LPN Outcome: Progressing 04/30/2023 2248 by Baldwin Jamaica, LPN Outcome: Progressing   Problem: Coping: Goal: Level of anxiety will decrease 04/30/2023 2248 by Baldwin Jamaica, LPN Outcome: Progressing 04/30/2023 2248 by Baldwin Jamaica, LPN Outcome: Progressing   Problem: Elimination: Goal: Will not experience complications related to bowel motility 04/30/2023 2248 by Baldwin Jamaica, LPN Outcome: Progressing 04/30/2023 2248 by Baldwin Jamaica, LPN Outcome: Progressing Goal: Will not experience complications related to urinary retention 04/30/2023 2248 by Baldwin Jamaica, LPN Outcome: Progressing 04/30/2023 2248 by Baldwin Jamaica, LPN Outcome: Progressing   Problem: Pain Managment: Goal: General experience of comfort will improve 04/30/2023 2248 by Baldwin Jamaica, LPN Outcome: Progressing 04/30/2023 2248 by Baldwin Jamaica, LPN Outcome: Progressing   Problem: Safety: Goal: Ability to remain free from injury will improve 04/30/2023 2248 by Baldwin Jamaica, LPN Outcome: Progressing 04/30/2023 2248 by Baldwin Jamaica, LPN Outcome: Progressing   Problem: Skin Integrity: Goal: Risk for impaired skin integrity will decrease 04/30/2023 2248 by Baldwin Jamaica, LPN Outcome: Progressing 04/30/2023 2248 by Baldwin Jamaica, LPN Outcome: Progressing
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
## 2023-05-01 DIAGNOSIS — B028 Zoster with other complications: Secondary | ICD-10-CM | POA: Diagnosis not present

## 2023-05-01 DIAGNOSIS — B20 Human immunodeficiency virus [HIV] disease: Secondary | ICD-10-CM | POA: Diagnosis not present

## 2023-05-01 DIAGNOSIS — L0390 Cellulitis, unspecified: Secondary | ICD-10-CM | POA: Diagnosis not present

## 2023-05-01 LAB — BASIC METABOLIC PANEL
Anion gap: 6 (ref 5–15)
BUN: <5 mg/dL — ABNORMAL LOW (ref 6–20)
CO2: 26 mmol/L (ref 22–32)
Calcium: 7.9 mg/dL — ABNORMAL LOW (ref 8.9–10.3)
Chloride: 103 mmol/L (ref 98–111)
Creatinine, Ser: 0.79 mg/dL (ref 0.44–1.00)
GFR, Estimated: >60 mL/min (ref >60)
Glucose, Bld: 88 mg/dL (ref 70–99)
Potassium: 4 mmol/L (ref 3.5–5.1)
Sodium: 135 mmol/L (ref 135–145)

## 2023-05-01 LAB — RAPID URINE DRUG SCREEN, HOSP PERFORMED
Amphetamines: NOT DETECTED
Barbiturates: NOT DETECTED
Benzodiazepines: NOT DETECTED
Cocaine: NOT DETECTED
Opiates: NOT DETECTED
Tetrahydrocannabinol: POSITIVE — AB

## 2023-05-01 LAB — MAGNESIUM: Magnesium: 1.8 mg/dL (ref 1.7–2.4)

## 2023-05-01 LAB — VARICELLA-ZOSTER BY PCR: Varicella-Zoster, PCR: POSITIVE — AB

## 2023-05-01 LAB — CBC
HCT: 30 % — ABNORMAL LOW (ref 36.0–46.0)
Hemoglobin: 10.1 g/dL — ABNORMAL LOW (ref 12.0–15.0)
MCH: 28.5 pg (ref 26.0–34.0)
MCHC: 33.7 g/dL (ref 30.0–36.0)
MCV: 84.5 fL (ref 80.0–100.0)
Platelets: 99 10*3/uL — ABNORMAL LOW (ref 150–400)
RBC: 3.55 MIL/uL — ABNORMAL LOW (ref 3.87–5.11)
RDW: 14.6 % (ref 11.5–15.5)
WBC: 2 10*3/uL — ABNORMAL LOW (ref 4.0–10.5)
nRBC: 0 % (ref 0.0–0.2)

## 2023-05-01 LAB — HCV INTERPRETATION

## 2023-05-01 LAB — HCV AB W REFLEX TO QUANT PCR: HCV Ab: NONREACTIVE

## 2023-05-01 LAB — HEPATITIS B SURFACE ANTIBODY, QUANTITATIVE: Hep B S AB Quant (Post): <3.5 m[IU]/mL — ABNORMAL LOW

## 2023-05-01 LAB — HIV-1 RNA QUANT-NO REFLEX-BLD
HIV 1 RNA Quant: 162000 {copies}/mL
LOG10 HIV-1 RNA: 5.21 {Log_copies}/mL

## 2023-05-01 MED ORDER — BICTEGRAVIR-EMTRICITAB-TENOFOV 50-200-25 MG PO TABS
1.0000 | ORAL_TABLET | Freq: Every day | ORAL | Status: DC
  Administered 2023-05-01 – 2023-05-05 (×5): 1 via ORAL
  Filled 2023-05-01 (×5): qty 1

## 2023-05-01 MED ORDER — DEXTROSE 5 % IV SOLN
1200.0000 mg | Freq: Once | INTRAVENOUS | Status: AC
  Administered 2023-05-01: 1200 mg via INTRAVENOUS
  Filled 2023-05-01: qty 12

## 2023-05-01 MED ORDER — SULFAMETHOXAZOLE-TRIMETHOPRIM 800-160 MG PO TABS
1.0000 | ORAL_TABLET | Freq: Every day | ORAL | Status: DC
  Administered 2023-05-01 – 2023-05-04 (×4): 1 via ORAL
  Filled 2023-05-01 (×4): qty 1

## 2023-05-01 NOTE — Progress Notes (Signed)
Referral placed to [facility] bridge counselor.   Sandie Ano, RN
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
## 2023-05-01 NOTE — Plan of Care (Signed)
progressing 

## 2023-05-01 NOTE — Progress Notes (Signed)
   04/29/23 1502  What Happened  Was fall witnessed? No  Was patient injured? No  Patient found on floor  Found by Staff-comment (primary nurse, Stasia Cavalier, RN)  Stated prior activity ambulating-unassisted  Provider Notification  Provider Name/Title Dr. Renford Dills  Date Provider Notified 04/29/23  Time Provider Notified 1531  Method of Notification Page (secure chat)  Notification Reason Fall  Provider response No new orders  Date of Provider Response 04/29/23  Time of Provider Response 1540  Follow Up  Family notified No - patient refusal  Progress note created (see row info) Yes  Blank note created Yes  Adult Fall Risk Assessment  Risk Factor Category (scoring not indicated) High fall risk per protocol (document High fall risk)  Patient Fall Risk Level High fall risk  Adult Fall Risk Interventions  Required Bundle Interventions *See Row Information* High fall risk - low, moderate, and high requirements implemented  Additional Interventions Use of appropriate toileting equipment (bedpan, BSC, etc.)  Fall intervention(s) refused/Patient educated regarding refusal Bed alarm  Screening for Fall Injury Risk (To be completed on HIGH fall risk patients) - Assessing Need for Floor Mats  Risk For Fall Injury- Criteria for Floor Mats Previous fall this admission  Will Implement Floor Mats Yes  Vitals  Cardiac Rhythm NSR (ADMIT STRIP)  Pain Assessment  Pain Scale 0-10  Pain Score 10  Pain Type Neuropathic pain;Acute pain  Pain Location Back  Pain Descriptors / Indicators Aching  Pain Frequency Constant  Pain Onset On-going  Patients Stated Pain Goal 0  Pain Intervention(s) Medication (See eMAR)  Multiple Pain Sites No  Neurological  Neuro (WDL) WDL  Level of Consciousness Alert  Orientation Level Oriented X4  Cognition Follows commands  Speech Slurred/Dysarthria  Glasgow Coma Scale  Eye Opening 4  Best Verbal Response (NON-intubated) 5  Best Motor Response 6  Glasgow Coma  Scale Score 15  Musculoskeletal  Musculoskeletal (WDL) X  Assistive Device BSC  Generalized Weakness Yes  Weight Bearing Restrictions No  Musculoskeletal Details  RLE Limited movement  LLE Limited movement  Integumentary  Integumentary (WDL) X  Skin Color Appropriate for ethnicity  Skin Condition Dry  Skin Integrity Rash;Blister  Description of Blister Serous  Blister Location Back;Chest  Rash Location Back;Chest  Skin Turgor Non-tenting  Pain Assessment  Result of Injury No  Pain Assessment  Work-Related Injury No  Pain Screening  Response to Interventions Decreased  Clinical Progression Not changed

## 2023-05-01 NOTE — Progress Notes (Signed)
Responded to [place] consult to  pray for and support patient.  Consult complete.  Chaplain available as needed.  Venida Jarvis, [place] in, [facility], Pager [phone]

## 2023-05-01 NOTE — Progress Notes (Signed)
Mobility Specialist: Progress Note   05/01/23 1338  Mobility  Activity Ambulated with assistance in hallway  Level of Assistance Contact guard assist, steadying assist  Assistive Device Other (Comment) (IV pole)  Distance Ambulated (ft) 120 ft  Activity Response Tolerated well  Mobility Referral Yes  $Mobility charge 1 Mobility  Mobility Specialist Start Time (ACUTE ONLY) 1214  Mobility Specialist Stop Time (ACUTE ONLY) 1231  Mobility Specialist Time Calculation (min) (ACUTE ONLY) 17 min    Pt was agreeable to mobility session - SV for bed mobility, CG for STS and ambulation d/t slight LOB and unsteadiness. Held on to IV pole with both hands and stated she preferred that level of support instead of HHA with IV pole. No complaints throughout. Returned to room without fault. Left on EOB with all needs met, call bell in reach.   Maurene Capes Mobility Specialist Please contact via SecureChat or Rehab office at [phone]
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
## 2023-05-01 NOTE — Progress Notes (Signed)
[facility] Infectious Diseases Follow Up Note  Patient Identification: Patient Name: Christina [name] MRN: [id number] Admit Date: 04/28/2023  7:27 PM Age: 52 y.o.Today's Date: 05/01/2023  Reason for Visit: AIDS, Shingles   Principal Problem:   Cellulitis Active Problems:   Herpes zoster   Sepsis (HCC)   HIV (human immunodeficiency virus infection) (HCC)   Generalized weakness   Falls   Hyponatremia   Hypokalemia   Antibiotics: Vancomycin 5/3 Ceftriaxone 5/3 Cefazolin 5/4-c IV acyclovir 5/4-c  Lines/Hardwares:   Interval Events: fevers curve improving T max 100.9. labs remarkable for k 3.4, WBC 2, platelets 111   Assessment 52 year old female with history of homelessness, Depression/anxiety, chronic back pain, DVT in 2018, marijuana and tobacco abuse who presented with fall  as well as generalized weakness.   # Lower flank/chest/back shingles - still has raw area of ruptured blisters and vesicles in the Left chest beneath the breast circling back to the flank and lower back . No new visual, hearing or neurological complaints  9/4 VXV PCR +  # Newly diagnosed AIDs - willing to be started on ART  Lab Results  Component Value Date   HIV1RNAQUANT 162,000 04/30/2023   Lab Results  Component Value Date   CD4TABS <35 (L) 04/28/2023    # Pancytopenia - in the setting of AIDS, infection , Liver enzymes wnl   Recommendations Continue IV acyclovir as is  Continue IV cefazolin, expect 7 days course if continues to improve Will start Biktarvy as well as bactrim ppx today and one dose of azithromycin for MAC prophylaxis Monitor CBC. BMP, ensure hydration  Will need case management as well as social work to assist with poor socioeconomic condition Following peripherally over the weekend.   Rest of the management as per the primary team. Thank you for the consult. Please page with pertinent questions or  concerns.  ______________________________________________________________________ Subjective patient seen and examined at the bedside. Pain at the rash is stable. Denies any new complaints.   Vitals BP 138/85 (BP Location: Left Arm)   Pulse 81   Temp 98.8 F (37.1 C) (Oral)   Resp 16   Ht 5\' 6"  (1.676 m)   Wt 54.2 kg   SpO2 100%   BMI 19.29 kg/m     Physical Exam Constitutional:  adult female sitting in the bed, eating breakfast     Comments:   Cardiovascular:     Rate and Rhythm: Normal rate and regular rhythm.     Heart sounds:   Pulmonary:     Effort: Pulmonary effort is normal.     Comments:   Abdominal:     Palpations: Abdomen is soft.     Tenderness: non distended   Musculoskeletal:        General: No swelling or tenderness.   Skin:    Comments: rupturing, ulcerating rash in the left lower back, flank and lower chest   Neurological:     General: awake, alert, oriented, following commands   Psychiatric:        Mood and Affect: Mood normal.   Pertinent Microbiology Results for orders placed or performed during the hospital encounter of 04/28/23  Resp panel by RT-PCR (RSV, Flu A&B, Covid) Anterior Nasal Swab     Status: None   Collection Time: 04/28/23  8:04 PM   Specimen: Anterior Nasal Swab  Result Value Ref Range Status   SARS Coronavirus 2 by RT PCR NEGATIVE NEGATIVE Final   Influenza A by PCR NEGATIVE NEGATIVE Final  Influenza B by PCR NEGATIVE NEGATIVE Final    Comment: (NOTE) The Xpert Xpress SARS-CoV-2/FLU/RSV plus assay is intended as an aid in the diagnosis of influenza from Nasopharyngeal swab specimens and should not be used as a sole basis for treatment. Nasal washings and aspirates are unacceptable for Xpert Xpress SARS-CoV-2/FLU/RSV testing.  Fact Sheet for Patients: BloggerCourse.com  Fact Sheet for Healthcare Providers: SeriousBroker.it  This test is not yet approved or cleared  by the [place] FDA and has been authorized for detection and/or diagnosis of SARS-CoV-2 by FDA under an Emergency Use Authorization (EUA). This EUA will remain in effect (meaning this test can be used) for the duration of the COVID-19 declaration under Section 564(b)(1) of the Act, 21 U.S.C. section 360bbb-3(b)(1), unless the authorization is terminated or revoked.     Resp Syncytial Virus by PCR NEGATIVE NEGATIVE Final    Comment: (NOTE) Fact Sheet for Patients: BloggerCourse.com  Fact Sheet for Healthcare Providers: SeriousBroker.it  This test is not yet approved or cleared by the [place] FDA and has been authorized for detection and/or diagnosis of SARS-CoV-2 by FDA under an Emergency Use Authorization (EUA). This EUA will remain in effect (meaning this test can be used) for the duration of the COVID-19 declaration under Section 564(b)(1) of the Act, 21 U.S.C. section 360bbb-3(b)(1), unless the authorization is terminated or revoked.  Performed at [facility], [address] [place], [place] [address]   Blood Culture (routine x 2)     Status: None (Preliminary result)   Collection Time: 04/28/23  8:08 PM   Specimen: BLOOD  Result Value Ref Range Status   Specimen Description BLOOD RIGHT ANTECUBITAL  Final   Special Requests   Final    BOTTLES DRAWN AEROBIC AND ANAEROBIC Blood Culture adequate volume   Culture   Final    NO GROWTH 2 DAYS Performed at [facility], [address], [place], [place] [address]    Report Status PENDING  Incomplete  Blood Culture (routine x 2)     Status: None (Preliminary result)   Collection Time: 04/28/23  8:13 PM   Specimen: BLOOD  Result Value Ref Range Status   Specimen Description BLOOD LEFT ANTECUBITAL  Final   Special Requests   Final    BOTTLES DRAWN AEROBIC AND ANAEROBIC Blood Culture adequate volume   Culture   Final    NO GROWTH 2 DAYS Performed at  [facility], [address], [place], [place] [address]    Report Status PENDING  Incomplete    Pertinent Lab.    Latest Ref Rng & Units 04/30/2023    8:14 AM 04/29/2023    8:24 AM 04/28/2023    8:08 PM  CBC  WBC 4.0 - 10.5 K/uL 2.0  3.3  2.7   Hemoglobin 12.0 - 15.0 g/dL 65.7  84.6  96.2   Hematocrit 36.0 - 46.0 % 29.8  34.6  37.6   Platelets 150 - 400 K/uL 111  112  154       Latest Ref Rng & Units 04/30/2023    8:14 AM 04/29/2023   12:28 PM 04/28/2023    8:08 PM  CMP  Glucose 70 - 99 mg/dL 93  952  841   BUN 6 - 20 mg/dL 5  7  12    Creatinine 0.44 - 1.00 mg/dL 3.24  4.01  0.27   Sodium 135 - 145 mmol/L 131  137  133   Potassium 3.5 - 5.1  mmol/L 3.4  4.1  3.4   Chloride 98 - 111 mmol/L 100  105  102   CO2 22 - 32 mmol/L 24  24  20    Calcium 8.9 - 10.3 mg/dL 7.6  8.4  8.7   Total Protein 6.5 - 8.1 g/dL   9.2   Total Bilirubin 0.3 - 1.2 mg/dL   0.7   Alkaline Phos 38 - 126 U/L   75   AST 15 - 41 U/L   28   ALT 0 - 44 U/L   14      Pertinent Imaging today Plain films and CT images have been personally visualized and interpreted; radiology reports have been reviewed. Decision making incorporated into the Impression /   DG Chest Port 1 View  Result Date: 04/28/2023 CLINICAL DATA:  Questionable sepsis - evaluate for abnormality Fall at home.  Weakness. EXAM: PORTABLE CHEST 1 VIEW COMPARISON:  Chest CT 11/03/2016 FINDINGS: The cardiomediastinal contours are normal. The lungs are clear. Pulmonary vasculature is normal. No consolidation, pleural effusion, or pneumothorax. No acute osseous abnormalities are seen. IMPRESSION: Negative radiograph of the chest. Electronically Signed   By: Narda Rutherford M.D.   On: 04/28/2023 21:31    I have personally spent 51 minutes involved in face-to-face and non-face-to-face activities for this patient on the day of the visit. Professional time spent includes the following activities: Preparing to see the patient (review of tests), Obtaining  and/or reviewing separately obtained history (admission/discharge record), Performing a medically appropriate examination and/or evaluation , Ordering medications/tests/procedures, referring and communicating with other health care professionals, Documenting clinical information in the EMR, Independently interpreting results (not separately reported), Communicating results to the patient/family/caregiver, Counseling and educating the patient/family/caregiver and Care coordination (not separately reported).   Plan d/w requesting provider as well as ID pharm D  Note: This document was prepared using dragon voice recognition software and may include unintentional dictation errors.   Electronically signed by:   Odette Fraction, MD Infectious Disease Physician [facility] Pager: [phone]

## 2023-05-01 NOTE — Discharge Instructions (Signed)
[facility] assistance programs. If you are behind on your bills and expenses, and need some help to make it through a short term hardship or financial emergency, there are several organizations and charities in the [place] area that may be able to help. They range from the Pathmark Stores, Liberty Global, Landscape architect of Weyerhaeuser Company and the local community action agency, the Intel, Avnet. These groups may be able to provide you resources to help pay your utility bills, rent, and they even offer housing assistance.  Crisis assistance program Find help for paying your rent, electric bills, free food, and even funds to pay your mortgage. The Liberty Global 989-686-0021) offers several services to local families, as funding allows. The Emergency Assistance Program (EAP), which they administer, provides household goods, free food, clothing, and financial aid to people in need in the [facility] area. The EAP program does have some qualification, and counselors will interview clients for financial assistance by written referral only. Referrals need to be made by the Department of Social Services or by other EAP approved human services agencies or charities in the area.  Money for resources for emergency assistance are available for security deposits for rent, water, electric, and gas, past due rent, utility bills, past due mortgage payments, food, and clothing. The Liberty Global also operates a Programme researcher, broadcasting/film/video on the site. More Liberty Global.  Open Door Ministries of [employer], which can be reached at [phone], offers emergency assistance programs for those in need of help, such as food, rent assistance, a soup kitchen, shelter, and clothing. They are based in [facility] but provide a number of services to those that qualify for assistance. Continue with Open Door Ministries  programs.  [facility] Department of Social Services may be able to offer temporary financial assistance and cash grants for paying rent and utilities. Help may be provided for local county residents who may be experiencing personal crisis when other resources, including government programs, are not available. Call [phone]  [place]. [place] Society, which is based in [place], provides financial assistance of up to $50.00 to help pay for rent, utilities, cooling bills, rent, and prescription medications. The program also provides secondhand furniture to those in need. [phone]  Mattel is a Geneticist, molecular. The organization can offer emergency assistance for paying rent, electric bills, utilities, food, household products and furniture. They offer extensive emergency and transitional housing for families, children and single women, and also run a Boy's and Dole Food. 301 Thrift Shops, CMS Energy Corporation, and other aid offered too. [address] [place], [place] [address], [phone]  Additional locations of the [employer] Stores are in [place] and other nearby communities. When you have an emergency, need free food, money for basic needs, or just need assistance around Christmas, then the Pathmark Stores may have the resources you need. Or they can refer you to nearby agencies. Learn more.  [place] Low Income Risk manager - This is offered for [facility] families. The federal government created CIT Group Program provides a one-time cash grant payment to help eligible low-income families pay their electric and heating bills. [address], [place], [place] [address], [phone]  Government and [employer] - The county administers several emergency and self-sufficiency programs. Residents of [place]  can get help with energy bills and food, rent, and  other expenses. In addition,  work with a Sports coach who may be able to help you find a job or improve your employment skills. More [place] public assistance.  [place] Emergency Assistance - A program offers emergency utility and rent funds for greater [employer] area residents. The program can also provide counseling and referrals to charities and government programs. Also provides food and a free meal program that serves lunch Mondays - Saturdays and dinner seven days per week to individuals in the community. [address], [place], [place] [address], [phone]  [employer] - Offers affordable apartment and housing communities across [place] and [place]. The low income and seniors can access public housing, rental assistance to qualified applicants, and apply for the section 8 rent subsidy program. Other programs include Chiropractor and Engineer, maintenance. [address], [place], [place] [address], dial [phone].  Basic needs such as clothing - Low income families can receive free items (school supplies, clothes, holiday assistance, etc.) from clothing closets while more moderate income [address] families can shop at [employer]. Locations across the area help the needy. Get information on [place] Triad free clothing centers.  The [facility] provides transitional housing to veterans and the disabled. Clients will also access other services too, including life skills classes, case management, and assistance in finding permanent housing. [address] [place], [place] [address], call [phone]  [facility] Transitional Housing in [place] is for people who were just evicted or that are formerly homeless. The non-profit will also help then gain self-sufficiency, find a home or apartment to live in, and also provides information on rent assistance when needed. Dial [phone]  [employer] Partnership to End Homelessness is available in [place]. Families that were evicted or that are homeless can gain shelter, food, clothing, furniture, and also emergency financial assistance. Other services include financial skills and life skills coaching, job training, and case management. [address] [place], [place] [address]. Telephone [phone].  The Dynegy, Avnet. runs the [employer]. This can help people save money on their heating and summer cooling bills, and is free to low income families. Free upgrades can be made to your home. Phone [phone]  Many of the non-profits and programs mentioned above are all inclusive, meaning they can meet many needs of the low income, such as energy bills, food, rent, and more. However there are several organizations that focus just on rent and housing. Read more on rent assistance in [place] region.  Legal assistance for evictions, foreclosure, and more If you need free legal advice on civili issues, such as foreclosures, evictions, Electronics engineer, government programs, domestic issues and more, Armed forces operational officer Aid of [facility]) is a Associate Professor firm that provides free legal services and counsel to lower income people, seniors, disabled, and others. The goal is to ensure everyone has access to justice and fair representation.  Call them at [phone], or click here to learn more about [place] free legal assistance programs.  [place] Avnet and funds for emergency expenses The Pathmark Stores is another organization that can provide people with Deere & Company and funds to pay bills. Their assistance depends on funding, and the demand for help is always very high. They can provide cash to help pay rent, a missed mortgage payment, or gas, electric, and water bills. But the assistance doesn't stop there. They also have a food pantry  on site, which can provide  food once every three (3) months to people who need help. The KeyCorp can also offer a Engineering geologist once every three (3) months for a maximum three (3) times. After receiving this voucher over that period of time, applicants can receive this aid one every six (6) months after that. [phone].  Kohl's action agency The Intel, Avnet. offers job and Dispensing optician. Resources are focused on helping students obtain the skills and experiences that are necessary to compete in today's challenging and tight job market. The non-profit faith-based community action agency offers internship trainings as well as classroom instruction. Economically disadvantaged and challenged individuals and potential employers can use their services. Classes are tailored to meet the needs of people in the [facility] region. [place], [place] [address], [phone]    Foreclosure prevention services Housing Counseling and Education is also offered by [employer] of the [place]. The agency (phone number is below) is a Engineer, structural providing foreclosure advice and counseling. They offer mortgage resolution counseling and also reverse mortgage counseling. Counselors can direct people to both Kimberly-Clark, as well as Weyerhaeuser Company foreclosure assistance options.  Warehouse manager has locations in [place] and [place]. They run debt and foreclosure prevention programs for local families. A sampling of the programs offered include both Budget and Housing Counseling. This includes money management, financial advice, budget review and development of a written action plan with a Pensions consultant to help solve specific individual financial problems. In addition, housing and mortgage counselors can also provide pre- and post-purchase  homeownership counseling, default resolution counseling (to prevent foreclosure) and reverse mortgage counseling. A Debt Management Program allows people and families with a high level of credit card or medical debt to consolidate and repay consumer debt and loans to creditors and rebuild positive credit ratings and scores. [phone]  Debt assistance programs Receive free counseling and debt help from [facility] of the [place]. The [facility] based agency can be reached at [phone]. The counselors provide free help, and the services include budget counseling. This will help people manage their expenses and set goals. They also offer a Forensic scientist, which will help individuals consolidate their debts and become debt free. Most of the workshops and services are free.  Community clinics in [place] of the leading health and dental centers are listed below. They may be able to provide medication, physicals, dental care, and general family care to residents of all incomes and backgrounds across the region. Some of the programs focus on the low income and underinsured. However if these clinics can't meet your needs, find information and details on more clinics in [facility].  Some of the options include Mar[employer]ott of [employer]. This center provides free or low cost health care to low-income adults 18 - 64, who have no health insurance. Among other services offered include a pharmacy and eye clinic. Phone [phone]  [facility], which is located in [place], is a community clinic that provides primary medical and health care to uninsured and underinsured adults and families, as well as the low income, in the greater [place] area on a sliding-fee scale. Call [phone]  [place] Adult Dental Program - They run a dental assistance program that is organized by [facility] to provide dental services  and aid  to [employer]. Services offered by the dental clinic are limited to extractions, pain management, and minor restorative care. [phone]  [place] [facility] has locations in [facility] and [place]. The community clinics provide complete pediatric care including primary health, mental health, social work, neurology, cardiology, asthma. Dial [phone].  In addition to those [address] and Safeway Inc, find other free community clinics in [place] and across the county.  Food pantry and assistance Some of the local food pantries and distribution centers to call for free food and groceries include The Hive of [place] (phone [phone]), The Altus Lumber[employer] LP (phone [phone]) and also PPL Corporation. Dial [phone].  Several other food banks in the region provide clothing, free food and meals, access to soup kitchens and other help. Find the addresses and phone numbers of more food pantries in [place]. http://www.needhelppayingbills.com/html/[place]_assistance_pro.html  SUNDAYS BREAKFAST TWO LOCATIONS: 8:00am served in Nucor Corporation by Awaken PPL Corporation 8:30am SHUTTLE provided from [facility], served at Apache Corporation, [address]. LUNCH TWO LOCATIONS [plus one additional third Sunday only] 10:30am - 12:30pm served at Ecolab, Liberty Global, [place] [address] (1.2 miles from [facility]) 12:30pm served in [place] by Land O'Lakes Team (THIRD Sunday only) 1:30pm served at [facility] by [facility] one location [plus one additional third Sunday only] 5:00pm Every Sunday, served under the bridge at [address] by Lindell Noe Under the 3M Company (.7 miles from [facility]) (THIRD Sunday ONLY) 4:00pm served in the parking garage, across from Nucor Corporation, corner of [place] and [place] by Ryland Group Works  Ministries MONDAYS BREAKFAST 7:30am served in [employer]ucor Corporation by the United States Steel Corporation and Friends LUNCH 10:30am - 12:30pm served at Ecolab, Liberty Global, [place] [address] (1.2 miles from [facility]) DINNER TWO LOCATIONS: 7:00pm served in front of the courthouse at the corner of Goldman Sachs and International Business Machines. by [facility] Monday Night Meal (3 blocks from [facility]) 4:30pm served at the AutoNation, [address] by The Procter & Gamble Not Bombs (0.6 miles from [facility]) [place] BREAKFAST 8:00am - 9:00am served at The TJX Companies, [address] (0.3 miles from [place]) LUNCH 10:30am - 12:30pm served at the Ecolab, Liberty Global [address] (1.2 miles from [place]) DINNER 6:00pm served at CSX Corporation, enter from Capital One and go to the Sonic Automotive, (0.7 miles from [place]) [facility] BREAKFAST 7:00am - 8:00am served at Ecolab, Liberty Global [address] (1.2 miles from [place]) LUNCH ONE LOCATION [plus two additional locations listed below] 10:30am - 12:30pm served at Ecolab, Liberty Global [address], (1.2 miles from [place]) (FIRST Wednesday ONLY) 11:30am served at Dillard's, [place] [address] (6.6 miles from [place]) (SECOND Wednesday ONLY) 11:00am served at Edith Endave. Melvyn Novas of [address], [address] (1.3 miles from [place]) [place] TWO LOCATIONS 6:00pm served at W. R. Berkley, [place] [address] Viste[address] Corporation. (1.3 miles from [facility]) 4:00pm - 6:00pm (hot dogs and chips) served at Levi Strauss of [facility], [address] (1.7 miles from Home) [place] BREAKFAST NOT AVAILABLE AT THIS TIME LUNCH 10:30am - 12:30pm served at Ecolab, [place]  AT&T,  [phone] W[address], (1.2 miles from [place]) DINNER 6:00pm served at CSX Corporation, enter from Capital One and go to the Sonic Automotive, (0.7 miles from Nucor Corporation) [place] BREAKFAST NOT AVAILABLE AT THIS TIME LUNCH 10:30am - 12:30pm served at Ecolab, Liberty Global [address] (1.2 miles from [place]) DINNER TWO LOCATIONS, [plus one additional first Friday only] 6:00pm served under the bridge at [address] by Lindell Noe Under CSX Corporation. (.7 miles from [facility]) 5:00pm - 7:00pm served at Levi Strauss of [facility], [address] (1.7 miles from [place]) (FIRST Friday ONLY) 5:45 pm - SHUTTLE provided from the LIBRARY at 5:45pm. Served at [facility], [address]. SATURDAYS BREAKFAST TWO LOCATIONS [plus one additional last Saturday only] 8:00am served at [facility] by Delphi 8:30am served at Pulte Homes, [address]. (2.2 miles from [facility]) (LAST Saturday ONLY) 8:30am served at Beazer Homes, [address] (5 miles from [place]) LUNCH 10:30am - 12:30pm served at Ecolab, Liberty Global [address] (1.2 miles from [facility]) DINNER 6:00pm served under the bridge at [address] by World Fuel Services Corporation (0.7 miles from Nucor Corporation)  DIRECTIONS FROM [place] TO ALL MEAL LOCATIONS The Bridge at [address] (.7 miles from [address]) [address] on [place]. Turn Right onto DIRECTV 433 ft. Continue onto [address] under bridge, about 500 ft. Courthouse (3 blocks from [facility]) [place] on [address]. Turn right on [place] 1 block to PPL Corporation (.5 miles from [place]) [place] on [place]. YRC Worldwide. past Brink's Company to EMCOR. Enter from Capital One and  go to the Affiliated Computer Services building W. R. Berkley [address] Visteon [address]. (1.3 miles from [facility]) [address] on [place]. Turn Right onto [address]. [address]. church will be on the Left. The TJX Companies [address] (.3 miles from [facility]) Go .3 miles on W. [address] Destination is on your right Dillard's at ONEOK (6.6 miles from [address]) [address] on [place] toward [address] Turn right onto [address] Continue onto Alcoa Inc. Continue onto Toll Brothers. 5. Elesa Hacker is on right [facility] Conseco) [address] (.6 miles from [address]) [place] on [place] Turn Left onto [address] 0.3 miles Destination is on the Left. [place] Black & Decker at American Express (5 miles from Nucor Corporation) 1. Head south on [address]. Turn right onto [address] Turn slightly left onto Quest Diagnostics Continue onto Quest Diagnostics Turn right at Barnes & Noble Continue to church on right [place] Sounds of [facility] [address] (1.7 miles from [place]) [address] on [place] 1.4 miles [place] becomes [place]. [address] Continue 0.6 miles and church will be on theright. [place] Guardian Life Insurance at [address] (2.5 miles from Nucor Corporation) [employer] provided from Massachusetts Mutual Life Park] [place] on [place]. Elm toward Estée Lauder right onto Costco Wholesale left onto Henry Schein left onto Micron Technology [address] Southern Company (  2.2 miles from [facility]) [address] on [place] 1.4 miles [place] becomes [place]. El[address] Turn right onto [address] and church will be on the Left. Potter[place] House/[place] AT&T [address] (1.2 miles from [facility]) 1.Turn right onto [facility] 2.Turn left onto Rogue Jury 3.Reino Kent 4.Destination is on your right [place]. Melvyn Novas of [address] at General Mills (1.3 miles from [facility]) [address] on [address] Turn left onto Genuine Parts right onto [address]. [place]. Continue onto KB Home	[place]. Turn left onto Smurfit-Stone Container. Turn right onto WellPoint.

## 2023-05-02 DIAGNOSIS — L0390 Cellulitis, unspecified: Secondary | ICD-10-CM | POA: Diagnosis not present

## 2023-05-02 LAB — BASIC METABOLIC PANEL
Anion gap: 9 (ref 5–15)
BUN: <5 mg/dL — ABNORMAL LOW (ref 6–20)
CO2: 24 mmol/L (ref 22–32)
Calcium: 8.1 mg/dL — ABNORMAL LOW (ref 8.9–10.3)
Chloride: 104 mmol/L (ref 98–111)
Creatinine, Ser: 0.78 mg/dL (ref 0.44–1.00)
GFR, Estimated: >60 mL/min (ref >60)
Glucose, Bld: 81 mg/dL (ref 70–99)
Potassium: 3.6 mmol/L (ref 3.5–5.1)
Sodium: 137 mmol/L (ref 135–145)

## 2023-05-02 NOTE — Plan of Care (Signed)
  Problem: Nutrition: Goal: Adequate nutrition will be maintained Outcome: Progressing   Problem: Pain Managment: Goal: General experience of comfort will improve Outcome: Progressing   Problem: Safety: Goal: Ability to remain free from injury will improve Outcome: Progressing   
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
## 2023-05-02 NOTE — Progress Notes (Signed)
Pharmacy Antibiotic Note  Christina [name] is a 52 y.o. female admitted on 04/28/2023 with  generalized weakness, and sepsis.  Pharmacy has been consulted for acyclovir dosing. ID following patient.   Plan: Continue acyclovir 540 mg (10 mg/kg) IV q8h  Continue hydration with LR @ 75 mL/hr Monitor BMP daily   Height: 5\' 6"  (167.6 cm) Weight: 54.2 kg (119 lb 7.8 oz) IBW/kg (Calculated) : 59.3   Temp (24hrs), Avg:100 F (37.8 C), Min:98 F (36.7 C), Max:103 F (39.4 C)  Recent Labs  Lab 04/27/23 0957 04/28/23 2008 04/28/23 2023 04/28/23 2147 04/28/23 2230 04/28/23 2253 04/29/23 0824 04/29/23 1228 04/30/23 0814 05/01/23 0820 05/02/23 0635  WBC 2.9* 2.7*  --   --   --   --  3.3*  --  2.0* 2.0*  --   CREATININE 0.93 1.11*  --   --   --   --   --  0.79 0.82 0.79 0.78  LATICACIDVEN  --   --  1.7 1.5 1.6 1.4  --   --   --   --   --     Estimated Creatinine Clearance: 70.4 mL/min (by C-G formula based on SCr of 0.78 mg/dL).     No Known Allergies  Antimicrobials this admission: 9/3 vanc x1  9/3 rocephin x1  9/4 acyclovir >> 9/4 cefazolin >> 9/10 9/6 Biktarvy >> 9/6 Bactrim >> 9/4 Vanc>> X1   Dose adjustments this admission: N/a  Microbiology results: 9/2 HIV 4th Gen: Reactive  9/3 resp panel PCR >> neg  9/3 blood>> ngtd3 9/4 HIV VL>> 162, 000 9/4 zoster PCR >> (+)  9/5 RPR: neg  9/6 HCV: neg  9/6 Hep B S AB: <3.5 no immunity   Thank you for allowing pharmacy to be a part of this patient's care.  Merla Riches 05/02/2023 9:08 AM

## 2023-05-02 NOTE — Plan of Care (Signed)
  Problem: Pain Managment: Goal: General experience of comfort will improve Outcome: Progressing   Problem: Safety: Goal: Ability to remain free from injury will improve Outcome: Progressing   

## 2023-05-02 NOTE — Progress Notes (Signed)
Physical Therapy Treatment Patient Details Name: Christina [name] MRN: [id number] DOB: 10/16/1970 Today's Date: 05/02/2023   History of Present Illness Pt is 52 yo presenting to [facility] via EMS due to falling 2 times at home. EMS states pt slid out of bed but did not hit head and no LOC. Pt was diagnosed with shingles yesterday and [facility]. Pt was admitted for cellulitis. Per EMS pt with facial droop to the R side and slurred speech but this has been occurring for a year and no new deficits. PMH: Anxiety, Back pain, DVT, newly diagnosed HIV.    PT Comments  Pt greeted resting in bed on arrival and agreeable to session with continued progress towards acute goals. Pt able to progress gait distance with single UE on IV pole and CGA for safety without overt LOB however pt with general instability, narrow BOS, and R/L drift in hall, pt with poor ability to correct with cues. Pt would benefit from AD support, plan to trial rollator next session for increased stability and safety if pt agreeable. Pt continues to be emotional about situation and course of hospital stay throughout session, therapeutic listening provided and pt expressing appreciation. Pt continues to benefit from skilled PT services to progress toward functional mobility goals.      If plan is discharge home, recommend the following: A little help with walking and/or transfers;Assist for transportation;Assistance with cooking/housework;Help with stairs or ramp for entrance   Can travel by private vehicle     Yes  Equipment Recommendations  Rolling walker (2 wheels);Other (comment) (defer to post acute)    Recommendations for Other Services       Precautions / Restrictions Precautions Precautions: Fall Precaution Comments: shingles on the L chest area, covid, hiv Restrictions Weight Bearing Restrictions: No     Mobility  Bed Mobility Overal bed mobility: Needs Assistance Bed Mobility: Supine to Sit     Supine to sit: Supervision      General bed mobility comments: supervision for safety    Transfers Overall transfer level: Needs assistance Equipment used: None Transfers: Sit to/from Stand Sit to Stand: Contact guard assist           General transfer comment: CGA for safety, steady rise    Ambulation/Gait Ambulation/Gait assistance: Contact guard assist Gait Distance (Feet): 240 Feet Assistive device: IV Pole Gait Pattern/deviations: Step-through pattern, Narrow base of support, Trunk flexed, Staggering left, Staggering right Gait velocity: decr     General Gait Details: single UE on IV pole, CGA for safety, no overt LOB, some drift R/L in hall   Stairs             Wheelchair Mobility     Tilt Bed    Modified Rankin (Stroke Patients Only)       Balance Overall balance assessment: Needs assistance Sitting-balance support: Feet supported Sitting balance-Leahy Scale: Fair     Standing balance support: Bilateral upper extremity supported Standing balance-Leahy Scale: Poor Standing balance comment: reliant on external support for balance. Previously pt was independent.                            Cognition Arousal: Alert Behavior During Therapy: Anxious Overall Cognitive Status: Impaired/Different from baseline Area of Impairment: Attention, Following commands, Safety/judgement, Awareness                   Current Attention Level: Sustained Memory: Decreased short-term memory Following Commands: Follows one step commands consistently  Safety/Judgement: Decreased awareness of deficits, Decreased awareness of safety Awareness: Intellectual Problem Solving: Slow processing, Decreased initiation, Requires verbal cues General Comments: emotional throughout.        Exercises      General Comments General comments (skin integrity, edema, etc.): pt emotional throughotu session, stating she has not been trated well throughout course of her stay and feels as  through no one is listening to her.      Pertinent Vitals/Pain Pain Assessment Pain Assessment: Faces Faces Pain Scale: Hurts a little bit Pain Location: over shingles Pain Descriptors / Indicators: Aching, Burning, Sharp Pain Intervention(s): Monitored during session, Limited activity within patient's tolerance    Home Living                          Prior Function            PT Goals (current goals can now be found in the care plan section) Acute Rehab PT Goals Patient Stated Goal: To get stronger PT Goal Formulation: With patient Time For Goal Achievement: 05/13/23 Progress towards PT goals: Progressing toward goals    Frequency    Min 1X/week      PT Plan      Co-evaluation              AM-PAC PT "6 Clicks" Mobility   Outcome Measure  Help needed turning from your back to your side while in a flat bed without using bedrails?: A Little Help needed moving from lying on your back to sitting on the side of a flat bed without using bedrails?: A Little Help needed moving to and from a bed to a chair (including a wheelchair)?: A Little Help needed standing up from a chair using your arms (e.g., wheelchair or bedside chair)?: A Little Help needed to walk in hospital room?: A Little Help needed climbing 3-5 steps with a railing? : A Little 6 Click Score: 18    End of Session   Activity Tolerance: Patient tolerated treatment well Patient left: with call bell/phone within reach;in bed Nurse Communication: Mobility status PT Visit Diagnosis: Unsteadiness on feet (R26.81);Other abnormalities of gait and mobility (R26.89)     Time: 6387-5643 PT Time Calculation (min) (ACUTE ONLY): 24 min  Charges:    $Gait Training: 8-22 mins $Therapeutic Activity: 8-22 mins PT General Charges $$ ACUTE PT VISIT: 1 Visit                     Diavion Labrador R. PTA Acute Rehabilitation Services Office: [phone]   Catalina Antigua 05/02/2023, 10:04 AM

## 2023-05-03 DIAGNOSIS — L0390 Cellulitis, unspecified: Secondary | ICD-10-CM | POA: Diagnosis not present

## 2023-05-03 LAB — CULTURE, BLOOD (ROUTINE X 2)
Culture: NO GROWTH
Culture: NO GROWTH
Special Requests: ADEQUATE
Special Requests: ADEQUATE

## 2023-05-03 LAB — BASIC METABOLIC PANEL
Anion gap: 10 (ref 5–15)
BUN: <5 mg/dL — ABNORMAL LOW (ref 6–20)
CO2: 24 mmol/L (ref 22–32)
Calcium: 8.1 mg/dL — ABNORMAL LOW (ref 8.9–10.3)
Chloride: 104 mmol/L (ref 98–111)
Creatinine, Ser: 0.96 mg/dL (ref 0.44–1.00)
GFR, Estimated: >60 mL/min (ref >60)
Glucose, Bld: 83 mg/dL (ref 70–99)
Potassium: 3.8 mmol/L (ref 3.5–5.1)
Sodium: 138 mmol/L (ref 135–145)

## 2023-05-03 MED ORDER — AMLODIPINE BESYLATE 5 MG PO TABS
5.0000 mg | ORAL_TABLET | Freq: Every day | ORAL | Status: DC
  Administered 2023-05-03 – 2023-05-05 (×3): 5 mg via ORAL
  Filled 2023-05-03 (×3): qty 1
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
## 2023-05-04 ENCOUNTER — Other Ambulatory Visit (HOSPITAL_COMMUNITY): Payer: Self-pay

## 2023-05-04 DIAGNOSIS — W19XXXA Unspecified fall, initial encounter: Secondary | ICD-10-CM | POA: Diagnosis not present

## 2023-05-04 DIAGNOSIS — B029 Zoster without complications: Secondary | ICD-10-CM

## 2023-05-04 DIAGNOSIS — R531 Weakness: Secondary | ICD-10-CM | POA: Diagnosis not present

## 2023-05-04 DIAGNOSIS — L0390 Cellulitis, unspecified: Secondary | ICD-10-CM | POA: Diagnosis not present

## 2023-05-04 DIAGNOSIS — E876 Hypokalemia: Secondary | ICD-10-CM

## 2023-05-04 DIAGNOSIS — E871 Hypo-osmolality and hyponatremia: Secondary | ICD-10-CM

## 2023-05-04 DIAGNOSIS — Z21 Asymptomatic human immunodeficiency virus [HIV] infection status: Secondary | ICD-10-CM

## 2023-05-04 DIAGNOSIS — A419 Sepsis, unspecified organism: Secondary | ICD-10-CM

## 2023-05-04 LAB — BASIC METABOLIC PANEL
Anion gap: 8 (ref 5–15)
BUN: <5 mg/dL — ABNORMAL LOW (ref 6–20)
CO2: 27 mmol/L (ref 22–32)
Calcium: 8.3 mg/dL — ABNORMAL LOW (ref 8.9–10.3)
Chloride: 101 mmol/L (ref 98–111)
Creatinine, Ser: 0.93 mg/dL (ref 0.44–1.00)
GFR, Estimated: >60 mL/min (ref >60)
Glucose, Bld: 88 mg/dL (ref 70–99)
Potassium: 4 mmol/L (ref 3.5–5.1)
Sodium: 136 mmol/L (ref 135–145)

## 2023-05-04 LAB — GC/CHLAMYDIA PROBE AMP (~~LOC~~) NOT AT ARMC
Chlamydia: NEGATIVE
Comment: NEGATIVE
Comment: NORMAL
Neisseria Gonorrhea: NEGATIVE

## 2023-05-04 MED ORDER — SULFAMETHOXAZOLE-TRIMETHOPRIM 800-160 MG PO TABS
1.0000 | ORAL_TABLET | ORAL | 0 refills | Status: DC
  Filled 2023-05-04: qty 12, 28d supply, fill #0

## 2023-05-04 MED ORDER — VALACYCLOVIR HCL 1 G PO TABS
1000.0000 mg | ORAL_TABLET | Freq: Three times a day (TID) | ORAL | 0 refills | Status: AC
  Filled 2023-05-04: qty 42, 14d supply, fill #0

## 2023-05-04 MED ORDER — SULFAMETHOXAZOLE-TRIMETHOPRIM 800-160 MG PO TABS: 1.0000 | ORAL_TABLET | ORAL | Status: DC

## 2023-05-04 MED ORDER — BICTEGRAVIR-EMTRICITAB-TENOFOV 50-200-25 MG PO TABS
1.0000 | ORAL_TABLET | Freq: Every day | ORAL | 0 refills | Status: DC
  Filled 2023-05-04: qty 30, 30d supply, fill #0

## 2023-05-04 MED ORDER — VALACYCLOVIR HCL 500 MG PO TABS
1000.0000 mg | ORAL_TABLET | Freq: Three times a day (TID) | ORAL | Status: DC
  Administered 2023-05-04 – 2023-05-05 (×3): 1000 mg via ORAL
  Filled 2023-05-04 (×5): qty 2

## 2023-05-04 NOTE — Plan of Care (Signed)
  Problem: Clinical Measurements: Goal: Ability to avoid or minimize complications of infection will improve Outcome: Progressing   Problem: Skin Integrity: Goal: Skin integrity will improve Outcome: Progressing   Problem: Education: Goal: Knowledge of General Education information will improve Description: Including pain rating scale, medication(s)/side effects and non-pharmacologic comfort measures Outcome: Progressing   Problem: Health Behavior/Discharge Planning: Goal: Ability to manage health-related needs will improve Outcome: Progressing   Problem: Activity: Goal: Risk for activity intolerance will decrease Outcome: Progressing   Problem: Nutrition: Goal: Adequate nutrition will be maintained Outcome: Progressing   Problem: Elimination: Goal: Will not experience complications related to bowel motility Outcome: Progressing Goal: Will not experience complications related to urinary retention Outcome: Progressing   Problem: Pain Managment: Goal: General experience of comfort will improve Outcome: Progressing   Problem: Safety: Goal: Ability to remain free from injury will improve Outcome: Progressing   Problem: Skin Integrity: Goal: Risk for impaired skin integrity will decrease Outcome: Progressing

## 2023-05-04 NOTE — Progress Notes (Signed)
Occupational Therapy Treatment Patient Details Name: Christina [name] MRN: [id number] DOB: 08-Jul-1971 Today's Date: 05/04/2023   History of present illness Pt is 52 yo presenting to [facility] via EMS due to falling 2 times at home. EMS states pt slid out of bed but did not hit head and no LOC. Pt was diagnosed with shingles yesterday and [facility]. Pt was admitted for cellulitis. Per EMS pt with facial droop to the R side and slurred speech but this has been occurring for a year and no new deficits. PMH: Anxiety, Back pain, DVT, newly diagnosed HIV.   OT comments  Pt in bed upon therapy arrival and agreeable to participate in OT treatment session. Session focused on ADL re-training while incorporating dynamic standing balance while completing self care tasks. Pt required CGA for all balance tasks throughout session.       If plan is discharge home, recommend the following:  A little help with walking and/or transfers;A little help with bathing/dressing/bathroom;Assistance with cooking/housework;Direct supervision/assist for medications management;Direct supervision/assist for financial management;Assist for transportation;Help with stairs or ramp for entrance   Equipment Recommendations  Other (comment) (RW)       Precautions / Restrictions Precautions Precautions: Fall Precaution Comments: shingles on the L chest area, HIV Restrictions Weight Bearing Restrictions: No       Mobility Bed Mobility Overal bed mobility: Independent Bed Mobility: Supine to Sit, Sit to Supine     Supine to sit: Independent, HOB elevated Sit to supine: Independent        Transfers Overall transfer level: Needs assistance Equipment used: None Transfers: Sit to/from Stand, Bed to chair/wheelchair/BSC Sit to Stand: Contact guard assist     Step pivot transfers: Contact guard assist     General transfer comment: Pt provided with CGA due to decreased balance. Pt holding onto furniture and walls while  navigating in room and bathroom.     Balance Overall balance assessment: Needs assistance Sitting-balance support: Feet supported Sitting balance-Leahy Scale: Normal       Standing balance-Leahy Scale: Poor Standing balance comment: pt reliant on external support for balance.          ADL either performed or assessed with clinical judgement   ADL       Grooming: Wash/dry hands;Supervision/safety   Upper Body Bathing: Standing           Lower Body Dressing: Supervision/safety;Sit to/from stand   Toilet Transfer: Supervision/safety;Ambulation;Grab bars;Regular Social worker and Hygiene: Modified independent;Sitting/lateral lean;Sit to/from stand                Cognition Arousal: Alert Behavior During Therapy: Anxious (distracted) Overall Cognitive Status: Impaired/Different from baseline Area of Impairment: Attention, Following commands, Safety/judgement, Awareness    Memory: Decreased short-term memory       Problem Solving: Slow processing, Decreased initiation, Requires verbal cues                     Pertinent Vitals/ Pain       Pain Assessment Pain Assessment: Faces Faces Pain Scale: Hurts little more Pain Location: over shingles area Pain Descriptors / Indicators: Aching, Burning, Sharp Pain Intervention(s): Patient requesting pain meds-RN notified         Frequency  Min 1X/week        Progress Toward Goals  OT Goals(current goals can now be found in the care plan section)  Progress towards OT goals: Progressing toward goals  AM-PAC OT "6 Clicks" Daily Activity     Outcome Measure   Help from another person eating meals?: None Help from another person taking care of personal grooming?: None Help from another person toileting, which includes using toliet, bedpan, or urinal?: A Little Help from another person bathing (including washing, rinsing, drying)?: A Little Help from another  person to put on and taking off regular upper body clothing?: None Help from another person to put on and taking off regular lower body clothing?: A Little 6 Click Score: 21    End of Session    OT Visit Diagnosis: Unsteadiness on feet (R26.81);Other abnormalities of gait and mobility (R26.89)   Activity Tolerance Patient tolerated treatment well   Patient Left in bed;with call bell/phone within reach   Nurse Communication Patient requests pain meds        Time: 6440-3474 OT Time Calculation (min): 22 min  Charges: OT General Charges $OT Visit: 1 Visit OT Treatments $Self Care/Home Management : 8-22 mins  Limmie Patricia, OTR/L,CBIS  Supplemental OT - [facility] and [facility] Secure Chat Preferred    Jaskiran Pata, Charisse March 05/04/2023, 9:48 PM

## 2023-05-04 NOTE — Plan of Care (Signed)
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
## 2023-05-04 NOTE — Progress Notes (Signed)
[facility] Infectious Diseases Follow Up Note  Patient Identification: Patient Name: Christina [name] MRN: [id number] Admit Date: 04/28/2023  7:27 PM Age: 52 y.o.Today's Date: 05/04/2023  Reason for Visit: AIDS, Shingles   Principal Problem:   Cellulitis Active Problems:   Herpes zoster   Sepsis (HCC)   HIV (human immunodeficiency virus infection) (HCC)   Generalized weakness   Falls   Hyponatremia   Hypokalemia   Antibiotics: Vancomycin 5/3 Ceftriaxone 5/3 Cefazolin 5/4-9/09  IV acyclovir 9/4-9/09; 9/9-c valacyclovir Biktegravir 9/6-c Bactrim ds 1 tab three times per week prophy     Assessment 52 year old female with history of homelessness, Depression/anxiety, chronic back pain, DVT in 2018, ?prior stroke with right facial weakness, marijuana and tobacco abuse who presented with fall  as well as generalized weakness.   # Lower flank/chest/back shingles - 9/4 VzV swab PCR + # superimposed cellulitis  Seem to be 1 dermatome involvement but could be 2 contiguous dermatomes Doesn't seem dissemination   # Newly diagnosed AIDs - willing to be started on ART  Lab Results  Component Value Date   HIV1RNAQUANT 162,000 04/30/2023   Lab Results  Component Value Date   CD4TABS <35 (L) 04/28/2023   # Pancytopenia - in the setting of AIDS, infection , Liver enzymes wnl   Recommendations Ok to transition to oral valtrex today to finish another 2 week course on 05/18/23 Stop cefazolin Possibly 2 contiguous dermatome and ok to keep in airborne isolation while in the hospital   Continue pjp prophy bactrim ds 1 tab tiw Continue biktarvy -- please provide 1 month supply on hand along with bactrim at discharge  I am ok deferring MAC prophylaxis at this time  Patient's information given to Mitch (our hiv [facility] transition coordinatory)  She is also setup to have hiv intake at [facility] (finance, provider visit, pharmacy  visit) on 10/8 starting at 1030; she should be there byt 1015   Will sign off Discussed with primary team    I spent more than 50 minute reviewing data/chart, and coordinating care, providing direct face to face time providing counseling/discussing diagnostics/treatment plan with patient and treatment team  ______________________________________________________________________ Subjective Rash stable/better On other complaint    Vitals BP (!) 143/98 (BP Location: Right Arm)   Pulse 98   Temp 98.6 F (37 C) (Oral)   Resp 17   Ht 5\' 6"  (1.676 m)   Wt 54.2 kg   SpO2 99%   BMI 19.29 kg/m     Physical Exam Constitutional:  no distress; conversant  HEENT: poor dentition several missing teeth  Cardiovascular:     Rate and Rhythm: Normal rate and regular rhythm.     Heart sounds:   Pulmonary:     Effort: Pulmonary effort is normal.     Comments:   Abdominal:     Palpations: Abdomen is soft.     Tenderness: non distended   Musculoskeletal:        General: No swelling or tenderness.   Skin:    Comments: left sided t5 and t6 dermatomes involvement confluent scabbing of rash with some place no scabbing; no new vessicular lesions  Neurological:     General: awake, alert, oriented; right lower facial paresis chronic  Psychiatric:        Mood and Affect: Mood normal.   Pertinent Microbiology Results for orders placed or performed during the hospital encounter of 04/28/23  Resp panel by RT-PCR (RSV, Flu A&B, Covid) Anterior Nasal Swab  Status: None   Collection Time: 04/28/23  8:04 PM   Specimen: Anterior Nasal Swab  Result Value Ref Range Status   SARS Coronavirus 2 by RT PCR NEGATIVE NEGATIVE Final   Influenza A by PCR NEGATIVE NEGATIVE Final   Influenza B by PCR NEGATIVE NEGATIVE Final    Comment: (NOTE) The Xpert Xpress SARS-CoV-2/FLU/RSV plus assay is intended as an aid in the diagnosis of influenza from Nasopharyngeal swab specimens and should not be  used as a sole basis for treatment. Nasal washings and aspirates are unacceptable for Xpert Xpress SARS-CoV-2/FLU/RSV testing.  Fact Sheet for Patients: BloggerCourse.com  Fact Sheet for Healthcare Providers: SeriousBroker.it  This test is not yet approved or cleared by the [place] FDA and has been authorized for detection and/or diagnosis of SARS-CoV-2 by FDA under an Emergency Use Authorization (EUA). This EUA will remain in effect (meaning this test can be used) for the duration of the COVID-19 declaration under Section 564(b)(1) of the Act, 21 U.S.C. section 360bbb-3(b)(1), unless the authorization is terminated or revoked.     Resp Syncytial Virus by PCR NEGATIVE NEGATIVE Final    Comment: (NOTE) Fact Sheet for Patients: BloggerCourse.com  Fact Sheet for Healthcare Providers: SeriousBroker.it  This test is not yet approved or cleared by the [place] FDA and has been authorized for detection and/or diagnosis of SARS-CoV-2 by FDA under an Emergency Use Authorization (EUA). This EUA will remain in effect (meaning this test can be used) for the duration of the COVID-19 declaration under Section 564(b)(1) of the Act, 21 U.S.C. section 360bbb-3(b)(1), unless the authorization is terminated or revoked.  Performed at [facility], [address], [place], [place] [address]   Blood Culture (routine x 2)     Status: None   Collection Time: 04/28/23  8:08 PM   Specimen: BLOOD  Result Value Ref Range Status   Specimen Description BLOOD RIGHT ANTECUBITAL  Final   Special Requests   Final    BOTTLES DRAWN AEROBIC AND ANAEROBIC Blood Culture adequate volume   Culture   Final    NO GROWTH 5 DAYS Performed at [facility], [address], [place], [place] [address]    Report Status 05/03/2023 FINAL  Final  Blood Culture (routine x 2)     Status: None    Collection Time: 04/28/23  8:13 PM   Specimen: BLOOD  Result Value Ref Range Status   Specimen Description BLOOD LEFT ANTECUBITAL  Final   Special Requests   Final    BOTTLES DRAWN AEROBIC AND ANAEROBIC Blood Culture adequate volume   Culture   Final    NO GROWTH 5 DAYS Performed at [facility], [address], [place], [place] [address]    Report Status 05/03/2023 FINAL  Final  Varicella-zoster by PCR     Status: Abnormal   Collection Time: 04/29/23  1:25 PM   Specimen: Other Source; Sterile Swab  Result Value Ref Range Status   Varicella-Zoster, PCR Positive (A) Negative Final    Comment: (NOTE) Varicella Zoster Virus DNA detected. This test was developed and its performance characteristics determined by [facility].  It has not been cleared or approved by the Food and Drug Administration.  The FDA has determined that such clearance or approval is not necessary. Performed At: [facility] [address] [place], [place] [id number] Jolene Schimke MD IO:[id number]     Pertinent Lab.    Latest Ref Rng & Units 05/01/2023    8:20  AM 04/30/2023    8:14 AM 04/29/2023    8:24 AM  CBC  WBC 4.0 - 10.5 K/uL 2.0  2.0  3.3   Hemoglobin 12.0 - 15.0 g/dL 60.1  09.3  23.5   Hematocrit 36.0 - 46.0 % 30.0  29.8  34.6   Platelets 150 - 400 K/uL 99  111  112       Latest Ref Rng & Units 05/04/2023    2:09 AM 05/03/2023    2:11 AM 05/02/2023    6:35 AM  CMP  Glucose 70 - 99 mg/dL 88  83  81   BUN 6 - 20 mg/dL <5  <5  <5   Creatinine 0.44 - 1.00 mg/dL 5.73  2.20  2.54   Sodium 135 - 145 mmol/L 136  138  137   Potassium 3.5 - 5.1 mmol/L 4.0  3.8  3.6   Chloride 98 - 111 mmol/L 101  104  104   CO2 22 - 32 mmol/L 27  24  24    Calcium 8.9 - 10.3 mg/dL 8.3  8.1  8.1      Pertinent Imaging today Plain films and CT images have been personally visualized and interpreted; radiology reports have been reviewed. Decision making incorporated into the Impression /   DG Chest Port 1  View  Result Date: 04/28/2023 CLINICAL DATA:  Questionable sepsis - evaluate for abnormality Fall at home.  Weakness. EXAM: PORTABLE CHEST 1 VIEW COMPARISON:  Chest CT 11/03/2016 FINDINGS: The cardiomediastinal contours are normal. The lungs are clear. Pulmonary vasculature is normal. No consolidation, pleural effusion, or pneumothorax. No acute osseous abnormalities are seen. IMPRESSION: Negative radiograph of the chest. Electronically Signed   By: Narda Rutherford M.D.   On: 04/28/2023 21:31          Raymondo Band, MD [facility] [phone]  pager   [phone] cell 05/04/2023, 2:36 PM

## 2023-05-05 ENCOUNTER — Other Ambulatory Visit (HOSPITAL_COMMUNITY): Payer: Self-pay

## 2023-05-05 DIAGNOSIS — B029 Zoster without complications: Secondary | ICD-10-CM | POA: Diagnosis not present

## 2023-05-05 DIAGNOSIS — L0390 Cellulitis, unspecified: Secondary | ICD-10-CM | POA: Diagnosis not present

## 2023-05-05 DIAGNOSIS — W19XXXA Unspecified fall, initial encounter: Secondary | ICD-10-CM | POA: Diagnosis not present

## 2023-05-05 DIAGNOSIS — R531 Weakness: Secondary | ICD-10-CM | POA: Diagnosis not present

## 2023-05-05 LAB — CBC
HCT: 28.3 % — ABNORMAL LOW (ref 36.0–46.0)
Hemoglobin: 9.5 g/dL — ABNORMAL LOW (ref 12.0–15.0)
MCH: 28.6 pg (ref 26.0–34.0)
MCHC: 33.6 g/dL (ref 30.0–36.0)
MCV: 85.2 fL (ref 80.0–100.0)
Platelets: 158 10*3/uL (ref 150–400)
RBC: 3.32 MIL/uL — ABNORMAL LOW (ref 3.87–5.11)
RDW: 14.3 % (ref 11.5–15.5)
WBC: 1.9 10*3/uL — ABNORMAL LOW (ref 4.0–10.5)
nRBC: 0 % (ref 0.0–0.2)

## 2023-05-05 LAB — BASIC METABOLIC PANEL
Anion gap: 7 (ref 5–15)
BUN: <5 mg/dL — ABNORMAL LOW (ref 6–20)
CO2: 24 mmol/L (ref 22–32)
Calcium: 8.5 mg/dL — ABNORMAL LOW (ref 8.9–10.3)
Chloride: 103 mmol/L (ref 98–111)
Creatinine, Ser: 0.81 mg/dL (ref 0.44–1.00)
GFR, Estimated: >60 mL/min (ref >60)
Glucose, Bld: 89 mg/dL (ref 70–99)
Potassium: 3.7 mmol/L (ref 3.5–5.1)
Sodium: 134 mmol/L — ABNORMAL LOW (ref 135–145)

## 2023-05-05 LAB — MAGNESIUM: Magnesium: 2 mg/dL (ref 1.7–2.4)

## 2023-05-05 MED ORDER — AMLODIPINE BESYLATE 5 MG PO TABS
5.0000 mg | ORAL_TABLET | Freq: Every day | ORAL | 2 refills | Status: DC
  Filled 2023-05-05: qty 30, 30d supply, fill #0

## 2023-05-05 MED ORDER — ESCITALOPRAM OXALATE 10 MG PO TABS
10.0000 mg | ORAL_TABLET | Freq: Every day | ORAL | 2 refills | Status: DC
  Filled 2023-05-05: qty 30, 30d supply, fill #0
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
## 2023-05-05 NOTE — Plan of Care (Signed)
  Problem: Clinical Measurements: Goal: Ability to avoid or minimize complications of infection will improve Outcome: Adequate for Discharge   Problem: Skin Integrity: Goal: Skin integrity will improve Outcome: Adequate for Discharge   Problem: Education: Goal: Knowledge of General Education information will improve Description: Including pain rating scale, medication(s)/side effects and non-pharmacologic comfort measures Outcome: Adequate for Discharge   Problem: Health Behavior/Discharge Planning: Goal: Ability to manage health-related needs will improve Outcome: Adequate for Discharge   Problem: Clinical Measurements: Goal: Ability to maintain clinical measurements within normal limits will improve Outcome: Adequate for Discharge Goal: Will remain free from infection Outcome: Adequate for Discharge Goal: Diagnostic test results will improve Outcome: Adequate for Discharge Goal: Respiratory complications will improve Outcome: Adequate for Discharge Goal: Cardiovascular complication will be avoided Outcome: Adequate for Discharge   Problem: Activity: Goal: Risk for activity intolerance will decrease Outcome: Adequate for Discharge   Problem: Nutrition: Goal: Adequate nutrition will be maintained Outcome: Adequate for Discharge   Problem: Coping: Goal: Level of anxiety will decrease Outcome: Adequate for Discharge   Problem: Elimination: Goal: Will not experience complications related to bowel motility Outcome: Adequate for Discharge Goal: Will not experience complications related to urinary retention Outcome: Adequate for Discharge   Problem: Pain Managment: Goal: General experience of comfort will improve Outcome: Adequate for Discharge   Problem: Safety: Goal: Ability to remain free from injury will improve Outcome: Adequate for Discharge   Problem: Skin Integrity: Goal: Risk for impaired skin integrity will decrease Outcome: Adequate for Discharge   

## 2023-06-01 NOTE — Progress Notes (Deleted)
HPI: Christina [name] is a 52 y.o. female who presents to the [facility] clinic today to initiate care for a newly diagnosed HIV infection.  Patient Active Problem List   Diagnosis Date Noted   Herpes zoster 04/29/2023   Sepsis (HCC) 04/29/2023   HIV (human immunodeficiency virus infection) (HCC) 04/29/2023   Generalized weakness 04/29/2023   Falls 04/29/2023   Hyponatremia 04/29/2023   Hypokalemia 04/29/2023   Cellulitis 04/28/2023   MDD (major depressive disorder), single episode, severe , no psychosis (HCC) 10/21/2018   Edema, peripheral 10/03/2018   MDD (major depressive disorder), recurrent episode, severe (HCC) 09/22/2018   Lumbar herniated disc 09/30/2017   Hypertension 05/22/2017   Migraine without aura and with status migrainosus, not intractable 05/22/2017   Tachycardia 05/22/2017   Insomnia 03/04/2017   Chronic bilateral low back pain with bilateral sciatica 01/26/2017   Depression with anxiety 01/26/2017   DVT (deep venous thrombosis) (HCC) 11/03/2016    Patient's Medications  New Prescriptions   No medications on file  Previous Medications   ACETAMINOPHEN (TYLENOL) 325 MG TABLET    Take 2 tablets (650 mg total) by mouth every 6 (six) hours as needed for mild pain, moderate pain or headache.   AMLODIPINE (NORVASC) 5 MG TABLET    Take 1 tablet (5 mg total) by mouth daily.   BICTEGRAVIR-EMTRICITABINE-TENOFOVIR AF (BIKTARVY) 50-200-25 MG TABS TABLET    Take 1 tablet by mouth daily.   ESCITALOPRAM (LEXAPRO) 10 MG TABLET    Take 1 tablet (10 mg total) by mouth daily.   OXYCODONE-ACETAMINOPHEN (PERCOCET/ROXICET) 5-325 MG TABLET    Take 1-2 tablets by mouth every 8 (eight) hours as needed for severe pain.   SULFAMETHOXAZOLE-TRIMETHOPRIM (BACTRIM DS) 800-160 MG TABLET    Take 1 tablet by mouth 3 (three) times a week.  Modified Medications   No medications on file  Discontinued Medications   No medications on file    Allergies: No Known Allergies  Past Medical  History: Past Medical History:  Diagnosis Date   Anxiety    Back pain    DVT (deep venous thrombosis) (HCC)    left leg with stent    Social History: Social History   Socioeconomic History   Marital status: Divorced    Spouse name: Not on file   Number of children: Not on file   Years of education: Not on file   Highest education level: Not on file  Occupational History   Not on file  Tobacco Use   Smoking status: Every Day    Current packs/day: 0.25    Types: Cigarettes   Smokeless tobacco: Never  Vaping Use   Vaping status: Never Used  Substance and Sexual Activity   Alcohol use: Yes   Drug use: Yes    Types: Marijuana   Sexual activity: Yes    Birth control/protection: Other-see comments    Comment: Tubal ligation  Other Topics Concern   Not on file  Social History Narrative   Not on file   Social Determinants of Health   Financial Resource Strain: High Risk (07/08/2017)   Overall Financial Resource Strain (CARDIA)    Difficulty of Paying Living Expenses: Very hard  Food Insecurity: Food Insecurity Present (04/29/2023)   Hunger Vital Sign    Worried About Running Out of Food in the Last Year: Sometimes true    Ran Out of Food in the Last Year: Sometimes true  Transportation Needs: No Transportation Needs (04/29/2023)   PRAPARE - Transportation  Lack of Transportation (Medical): No    Lack of Transportation (Non-Medical): No  Physical Activity: Insufficiently Active (07/08/2017)   Exercise Vital Sign    Days of Exercise per Week: 2 days    Minutes of Exercise per Session: 10 min  Stress: Stress Concern Present (07/08/2017)   [employer] of Occupational Health - Occupational Stress Questionnaire    Feeling of Stress : Very much  Social Connections: Moderately Isolated (07/08/2017)   Social Connection and Isolation Panel [NHANES]    Frequency of Communication with Friends and Family: Three times a week    Frequency of Social Gatherings with Friends  and Family: More than three times a week    Attends Religious Services: Never    Database administrator or Organizations: No    Attends Engineer, structural: Never    Marital Status: Divorced    Labs: Lab Results  Component Value Date   HIV1RNAQUANT 162,000 04/30/2023   CD4TABS <35 (L) 04/28/2023    RPR and STI Lab Results  Component Value Date   LABRPR NON REACTIVE 04/30/2023    STI Results GC CT  04/29/2023  2:51 PM Negative  Negative     Hepatitis B Lab Results  Component Value Date   HEPBSAG NON REACTIVE 04/30/2023   HEPBCAB NON REACTIVE 04/30/2023   Hepatitis C No results found for: "HEPCAB", "HCVRNAPCRQN" Hepatitis A No results found for: "HAV" Lipids: Lab Results  Component Value Date   CHOL 195 09/30/2018   TRIG 105 09/30/2018   HDL 57 09/30/2018   CHOLHDL 3.4 09/30/2018   VLDL 21 09/30/2018   LDLCALC 117 (H) 09/30/2018    Current HIV Regimen: Biktarvy  Assessment: Persais is here today to initiate care with Dr. Renold Don for her newly diagnosed HIV infection. She has been taking Biktarvy since she was diagnosed while admitted to [facility] last month. Has been tolerating well and denies any missed doses. Initial viral load was 162,000 on 04/30/2023; will recheck today. CD4 count was < 35 during admission; has also been taking Bactrim three times weekly. Will fill through ***; covered with insurance.   Plan: - Continue Biktarvy   Margarite Gouge, PharmD, CPP, BCIDP, AAHIVP Clinical Pharmacist Practitioner Infectious Diseases Clinical Pharmacist [facility] 06/01/2023, 11:52 AM

## 2023-06-02 ENCOUNTER — Other Ambulatory Visit (HOSPITAL_COMMUNITY): Payer: Self-pay

## 2023-06-02 ENCOUNTER — Encounter: Payer: MEDICAID | Admitting: Internal Medicine

## 2023-06-02 ENCOUNTER — Other Ambulatory Visit: Payer: Self-pay

## 2023-06-02 ENCOUNTER — Other Ambulatory Visit: Payer: MEDICAID

## 2023-06-02 ENCOUNTER — Ambulatory Visit: Payer: MEDICAID | Admitting: Pharmacist

## 2023-06-02 ENCOUNTER — Ambulatory Visit: Payer: MEDICAID

## 2023-06-02 ENCOUNTER — Telehealth: Payer: Self-pay

## 2023-06-02 NOTE — Telephone Encounter (Signed)
[facility] Patient Product/process development scientist completed.    The patient is insured through [facility] and has a $0.00 copay.  We will continue to follow to see if copay assistance is needed.  [name], CPhT Specialty Pharmacy Patient [name] Phone: [phone] Fax:  [phone]

## 2023-09-01 ENCOUNTER — Other Ambulatory Visit: Payer: Self-pay

## 2023-09-01 ENCOUNTER — Emergency Department (HOSPITAL_COMMUNITY)
Admission: EM | Admit: 2023-09-01 | Discharge: 2023-09-30 | Disposition: A | Discharge status: Home / Self Care | Payer: MEDICAID | Attending: Emergency Medicine | Admitting: Emergency Medicine

## 2023-09-01 ENCOUNTER — Encounter (HOSPITAL_COMMUNITY): Payer: Self-pay

## 2023-09-01 DIAGNOSIS — R0981 Nasal congestion: Secondary | ICD-10-CM | POA: Insufficient documentation

## 2023-09-01 DIAGNOSIS — Z21 Asymptomatic human immunodeficiency virus [HIV] infection status: Secondary | ICD-10-CM | POA: Diagnosis not present

## 2023-09-01 DIAGNOSIS — Z20822 Contact with and (suspected) exposure to covid-19: Secondary | ICD-10-CM | POA: Insufficient documentation

## 2023-09-01 LAB — URINALYSIS, W/ REFLEX TO CULTURE (INFECTION SUSPECTED)
Glucose, UA: NEGATIVE mg/dL
Hgb urine dipstick: NEGATIVE
Ketones, ur: 5 mg/dL — AB
Leukocytes,Ua: NEGATIVE
Nitrite: NEGATIVE
Protein, ur: 100 mg/dL — AB
Specific Gravity, Urine: 1.03 (ref 1.005–1.030)
pH: 5 (ref 5.0–8.0)

## 2023-09-01 LAB — CBC WITH DIFFERENTIAL/PLATELET
Abs Immature Granulocytes: 0.01 10*3/uL (ref 0.00–0.07)
Basophils Absolute: 0 10*3/uL (ref 0.0–0.1)
Basophils Relative: 1 %
Eosinophils Absolute: 0 10*3/uL (ref 0.0–0.5)
Eosinophils Relative: 2 %
HCT: 44.5 % (ref 36.0–46.0)
Hemoglobin: 14.8 g/dL (ref 12.0–15.0)
Immature Granulocytes: 0 %
Lymphocytes Relative: 29 %
Lymphs Abs: 0.8 10*3/uL (ref 0.7–4.0)
MCH: 28 pg (ref 26.0–34.0)
MCHC: 33.3 g/dL (ref 30.0–36.0)
MCV: 84.3 fL (ref 80.0–100.0)
Monocytes Absolute: 0.2 10*3/uL (ref 0.1–1.0)
Monocytes Relative: 8 %
Neutro Abs: 1.7 10*3/uL (ref 1.7–7.7)
Neutrophils Relative %: 60 %
Platelets: 119 10*3/uL — ABNORMAL LOW (ref 150–400)
RBC: 5.28 MIL/uL — ABNORMAL HIGH (ref 3.87–5.11)
RDW: 13.4 % (ref 11.5–15.5)
WBC: 2.7 10*3/uL — ABNORMAL LOW (ref 4.0–10.5)
nRBC: 0 % (ref 0.0–0.2)

## 2023-09-01 LAB — COMPREHENSIVE METABOLIC PANEL
ALT: 17 U/L (ref 0–44)
AST: 36 U/L (ref 15–41)
Albumin: 4 g/dL (ref 3.5–5.0)
Alkaline Phosphatase: 87 U/L (ref 38–126)
Anion gap: 10 (ref 5–15)
BUN: 11 mg/dL (ref 6–20)
CO2: 22 mmol/L (ref 22–32)
Calcium: 9.3 mg/dL (ref 8.9–10.3)
Chloride: 105 mmol/L (ref 98–111)
Creatinine, Ser: 0.63 mg/dL (ref 0.44–1.00)
GFR, Estimated: >60 mL/min (ref >60)
Glucose, Bld: 101 mg/dL — ABNORMAL HIGH (ref 70–99)
Potassium: 3.6 mmol/L (ref 3.5–5.1)
Sodium: 137 mmol/L (ref 135–145)
Total Bilirubin: 0.9 mg/dL (ref 0.0–1.2)
Total Protein: 9.2 g/dL — ABNORMAL HIGH (ref 6.5–8.1)

## 2023-09-01 LAB — RESP PANEL BY RT-PCR (RSV, FLU A&B, COVID)  RVPGX2
Influenza A by PCR: NEGATIVE
Influenza B by PCR: NEGATIVE
Resp Syncytial Virus by PCR: NEGATIVE
SARS Coronavirus 2 by RT PCR: NEGATIVE

## 2023-09-01 MED ORDER — ZOLPIDEM TARTRATE 5 MG PO TABS
5.0000 mg | ORAL_TABLET | Freq: Once | ORAL | Status: AC
  Administered 2023-09-01: 5 mg via ORAL
  Filled 2023-09-01: qty 1

## 2023-09-01 NOTE — ED Notes (Signed)
 Pt confused and wanting to leave.

## 2023-09-01 NOTE — ED Notes (Signed)
Pt taken to the bathroom via w/c

## 2023-09-01 NOTE — ED Triage Notes (Signed)
 Pt presents via EMS from home with c/o nasal congestion. Pt had a stroke approx 2 months ago so the slurring of speech is her baseline at this time. Pt also has speech and memory issues from the stroke. Pt is also here because she is agitated with the family member that she lives here.

## 2023-09-01 NOTE — ED Notes (Signed)
Pt given graham crackers and PB to eat.

## 2023-09-01 NOTE — ED Notes (Signed)
 Great aunt phone number- [phone]- name is Ruthie Dukes

## 2023-09-01 NOTE — ED Provider Notes (Signed)
 [place] EMERGENCY DEPARTMENT AT [facility] Provider Note   CSN: [id number] Arrival date & time: 09/01/23  1428     History  Chief Complaint  Patient presents with   Nasal Congestion    Christina [name] is a 53 y.o. female history of DVT, HIV, MDD, stroke with right facial droop and slurring presented for congestion.  Patient was unable to provide any history to myself with the triage provider and states repeatedly that she hates her aunt!  Patient states that she does not want a go back home to her and as she is mean to her.  Patient denies chest pain shortness of breath fevers nausea vomiting abdominal pain  Home Medications Prior to Admission medications   Medication Sig Start Date End Date Taking? Authorizing Provider  acetaminophen  (TYLENOL ) 325 MG tablet Take 2 tablets (650 mg total) by mouth every 6 (six) hours as needed for mild pain, moderate pain or headache. 10/28/18   Wonda Clarita BRAVO, NP  amLODipine  (NORVASC ) 5 MG tablet Take 1 tablet (5 mg total) by mouth daily. 05/05/23 05/04/24  Pokhrel, Vernal, MD  bictegravir-emtricitabine -tenofovir  AF (BIKTARVY ) 50-200-25 MG TABS tablet Take 1 tablet by mouth daily. 05/05/23   Pokhrel, Laxman, MD  escitalopram  (LEXAPRO ) 10 MG tablet Take 1 tablet (10 mg total) by mouth daily. 05/05/23 05/04/24  Pokhrel, Laxman, MD  oxyCODONE -acetaminophen  (PERCOCET/ROXICET) 5-325 MG tablet Take 1-2 tablets by mouth every 8 (eight) hours as needed for severe pain. 04/27/23   Patsey Lot, MD  sulfamethoxazole -trimethoprim  (BACTRIM  DS) 800-160 MG tablet Take 1 tablet by mouth 3 (three) times a week. 05/06/23   Pokhrel, Laxman, MD      Allergies    Patient has no known allergies.    Review of Systems   Review of Systems  Physical Exam Updated Vital Signs BP (!) 150/114 (BP Location: Left Arm)   Pulse 88   Temp (!) 97.4 F (36.3 C) (Oral)   Resp 18   SpO2 100%  Physical Exam Vitals reviewed.  Constitutional:      General: She is not  in acute distress. HENT:     Head: Normocephalic and atraumatic.  Eyes:     Extraocular Movements: Extraocular movements intact.     Conjunctiva/sclera: Conjunctivae normal.     Pupils: Pupils are equal, round, and reactive to light.  Cardiovascular:     Rate and Rhythm: Normal rate and regular rhythm.     Pulses: Normal pulses.     Heart sounds: Normal heart sounds.     Comments: 2+ bilateral radial/dorsalis pedis pulses with regular rate Pulmonary:     Effort: Pulmonary effort is normal. No respiratory distress.     Breath sounds: Normal breath sounds.  Abdominal:     Palpations: Abdomen is soft.     Tenderness: There is no abdominal tenderness. There is no guarding or rebound.  Musculoskeletal:        General: Normal range of motion.     Cervical back: Normal range of motion and neck supple.     Comments: 5 out of 5 bilateral grip/leg extension strength  Skin:    General: Skin is warm and dry.     Capillary Refill: Capillary refill takes less than 2 seconds.  Neurological:     Mental Status: She is alert and oriented to person, place, and time.     Comments: Sensation intact in all 4 limbs Right facial droop with slurring her words however this is chronic  Psychiatric:  Mood and Affect: Mood normal.     ED Results / Procedures / Treatments   Labs (all labs ordered are listed, but only abnormal results are displayed) Labs Reviewed  CBC WITH DIFFERENTIAL/PLATELET - Abnormal; Notable for the following components:      Result Value   WBC 2.7 (*)    RBC 5.28 (*)    Platelets 119 (*)    All other components within normal limits  COMPREHENSIVE METABOLIC PANEL - Abnormal; Notable for the following components:   Glucose, Bld 101 (*)    Total Protein 9.2 (*)    All other components within normal limits  RESP PANEL BY RT-PCR (RSV, FLU A&B, COVID)  RVPGX2  URINALYSIS, W/ REFLEX TO CULTURE (INFECTION SUSPECTED)    EKG None  Radiology No results  found.  Procedures Procedures    Medications Ordered in ED Medications - No data to display  ED Course/ Medical Decision Making/ A&P                                 Medical Decision Making  Christina [name] 53 y.o. presented today for congestion, not wanting to be at home with her aunt. Working DDx that I considered at this time includes, but not limited to, SNF placement, viral syndrome, pneumonia.  R/o DDx: Viral syndrome, pneumonia: These are considered less likely due to history of present illness, physical exam, labs/imaging findings  Review of prior external notes: 04/27/2023 ED  Unique Tests and My Interpretation:  Respiratory panel: Negative CMP: Unremarkable CBC: Unremarkable UA: Unable to obtain as patient adamantly refuses in and out or to give sample  Social Determinants of Health: homeless  Discussion with Independent Historian:  Patient's aunt  Discussion of Management of Tests:  Social work  Risk: Low: based on diagnostic testing/clinical impression and treatment plan  Risk Stratification Score: None  Staffed with Messick, MD  Plan: On exam patient was in no acute distress with stable vitals.  Patient did have a chronic right facial droop and slurring words but otherwise had reassuring physical exam.  Patient cannot tell me why she was here and was not endorsing any congestion which was in the triage note.  I spoke to the aunt she states that she is having difficult time caring for the patient at home I would like her evaluated in the ED for possible placement.  Patient is agreeable to stay and have social work evaluate her.  Do anticipate her being a border.  Attending did evaluate the patient and agrees to plan.  This chart was dictated using voice recognition software.  Despite best efforts to proofread,  errors can occur which can change the documentation meaning.         Final Clinical Impression(s) / ED Diagnoses Final diagnoses:  None    Rx  / DC Orders ED Discharge Orders     None         Christina [name] 09/01/23 1721    Christina Maude BROCKS, MD 09/01/23 [phone]

## 2023-09-01 NOTE — ED Notes (Signed)
 Pts stretcher has been reclined to bed, pt has sandwich and warm blanket to try and calm.

## 2023-09-01 NOTE — ED Provider Triage Note (Signed)
 Emergency Medicine Provider Triage Evaluation Note  Christina [name] , a 53 y.o. female  was evaluated in triage.  Pt complains of congestion.  Review of Systems  Positive:  Negative:   Physical Exam  There were no vitals taken for this visit. Gen:   Awake, no distress   Resp:  Normal effort  MSK:   Moves extremities without difficulty  Other:    Medical Decision Making  Medically screening exam initiated at 2:34 PM.  Appropriate orders placed.  Christina [name] was informed that the remainder of the evaluation will be completed by another provider, this initial triage assessment does not replace that evaluation, and the importance of remaining in the ED until their evaluation is complete.  Patient stating that she hates her aunt. Is not providing any other pertinent information. Apparently patient had a stroke 2 months ago and has had memory problems and slurred speech since. Patient also with housing issues because she lives with aunt who she does not get along with.   Christina [name], [place] 09/01/23 1440

## 2023-09-02 MED ORDER — ACETAMINOPHEN 325 MG PO TABS
650.0000 mg | ORAL_TABLET | Freq: Once | ORAL | Status: AC
Start: 2023-09-02 — End: 2023-09-02
  Administered 2023-09-02: 650 mg via ORAL
  Filled 2023-09-02: qty 2

## 2023-09-02 MED ORDER — ACETAMINOPHEN 325 MG PO TABS: 650.0000 mg | ORAL_TABLET | Freq: Once | ORAL | Status: DC

## 2023-09-02 NOTE — Evaluation (Signed)
 Physical Therapy Evaluation Patient Details Name: Christina [name] MRN: [id number] DOB: 07-23-1971 Today's Date: 09/02/2023  History of Present Illness  Pt is 53 yo female who presented to ED on 09/01/23 for congestion and not wanting to be home with her aunt. Per note aunt reports difficulty taking care of pt. Respiratory panel negative, refuses UA, chest xray negative.  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  Clinical Impression  Pt admitted with above diagnosis. Pt is poor historian and unable to provide accurate PLOF/home environment.  Per MD note, aunt was having difficulty taking care of pt and they are seeking placement.  Today, pt requiring min A for transfers and gait but at least mod cues for initiation, focus, and safety.  She ambulated 22' with min A and RW but was very unsteady and is high fall risk.  Unsure of baseline, but pt is fall risk and needs assist for transfers - Patient will benefit from continued inpatient follow up therapy, <3 hours/day at d/c and ongoing acute therapy while hospitalized.  Pt currently with functional limitations due to the deficits listed below (see PT Problem List). Pt will benefit from acute skilled PT to increase their independence and safety with mobility to allow discharge.           If plan is discharge home, recommend the following: A little help with walking and/or transfers;A little help with bathing/dressing/bathroom;Assistance with cooking/housework;Help with stairs or ramp for entrance   Can travel by private vehicle   Yes    Equipment Recommendations None recommended by PT  Recommendations for Other Services       Functional Status Assessment Patient has had a recent decline in their functional status and demonstrates the ability to make significant improvements in function in a reasonable and predictable amount of time.     Precautions / Restrictions Precautions Precautions:  Fall      Mobility  Bed Mobility Overal bed mobility: Needs Assistance Bed Mobility: Supine to Sit, Sit to Supine     Supine to sit: Min assist Sit to supine: Min assist        Transfers Overall transfer level: Needs assistance Equipment used: Rolling walker (2 wheels) Transfers: Sit to/from Stand Sit to Stand: Min assist                Ambulation/Gait Ambulation/Gait assistance: Editor, Commissioning (Feet): 50 Feet Assistive device: Rolling walker (2 wheels) Gait Pattern/deviations: Step-through pattern, Decreased stride length, Drifts right/left, Narrow base of support, Scissoring Gait velocity: decreased     General Gait Details: Pt unsteady, drifting R/L with RW, required assist with RW, frequent cues for RW use and to continue walking  Stairs            Wheelchair Mobility     Tilt Bed    Modified Rankin (Stroke Patients Only)       Balance Overall balance assessment: Needs assistance Sitting-balance support: No upper extremity supported Sitting balance-Leahy Scale: Fair     Standing balance support: Bilateral upper extremity supported, Reliant on assistive device for balance Standing balance-Leahy Scale: Poor Standing balance comment: RW and min A; hx of falls                             Pertinent Vitals/Pain Pain Assessment Pain Assessment: Faces Faces Pain Scale: Hurts a little bit Pain Location: top of feet Pain Descriptors /  Indicators: Discomfort Pain Intervention(s): Limited activity within patient's tolerance, Monitored during session (unable to state if chronic or acute)    Home Living Family/patient expects to be discharged to:: Private residence Living Arrangements: Other relatives Available Help at Discharge: Family;Available 24 hours/day (pt living with aunt) Type of Home: House Home Access: Stairs to enter   [employer] of Steps: 2   Home Layout: One level Home Equipment: Cane - single  Librarian, Academic (2 wheels) Additional Comments: Pt reports staying with her aunt.  She had difficulty providing home environment information    Prior Function Prior Level of Function : Patient poor historian/Family not available             Mobility Comments: Pt is poor historian.  Reports she walked but unable to state if using cane or RW or how far she could walk.  States she falls frequently - when asked why stated my aunt makes me, but unable to elaborate ADLs Comments: Reports independent but questionable historian     Extremity/Trunk Assessment   Upper Extremity Assessment Upper Extremity Assessment: Generalized weakness;Difficult to assess due to impaired cognition    Lower Extremity Assessment Lower Extremity Assessment: Generalized weakness;Difficult to assess due to impaired cognition (ROM in bil UE and bil LE is [facility].  MMT: pt has at least 3/5 strength throughout bil UE and bil LE but is unable to follow MMT commands for further testing)    Cervical / Trunk Assessment Cervical / Trunk Assessment: Other exceptions Cervical / Trunk Exceptions: Hx R facial droop and slurred speech  Communication   Communication Communication: Difficulty communicating thoughts/reduced clarity of speech  Cognition Arousal: Alert Behavior During Therapy: Lability (and easily distracted) Overall Cognitive Status: Impaired/Different from baseline Area of Impairment: Problem solving, Memory, Following commands, Awareness, Safety/judgement                     Memory: Decreased short-term memory Following Commands: Follows one step commands inconsistently Safety/Judgement: Decreased awareness of safety Awareness: Emergent Problem Solving: Slow processing, Decreased initiation, Requires verbal cues General Comments: Pt oriented to self, place, and states in hospital b/c doesn't want to return to her aunts.  She was very easily distracted during eval (is in busy ED hallway) and  difficulty following command.s        General Comments General comments (skin integrity, edema, etc.): Pt with bumps/scabs over all extremities    Exercises     Assessment/Plan    PT Assessment Patient needs continued PT services  PT Problem List Decreased strength;Pain;Decreased range of motion;Decreased cognition;Decreased activity tolerance;Decreased balance;Decreased mobility;Decreased knowledge of precautions;Decreased safety awareness;Decreased knowledge of use of DME       PT Treatment Interventions DME instruction;Therapeutic exercise;Gait training;Balance training;Stair training;Functional mobility training;Therapeutic activities;Patient/family education;Neuromuscular re-education;Cognitive remediation    PT Goals (Current goals can be found in the Care Plan section)  Acute Rehab PT Goals Patient Stated Goal: find another place to live, my aunt drives me crazy PT Goal Formulation: With patient Time For Goal Achievement: 09/16/23 Potential to Achieve Goals: Fair    Frequency Min 1X/week     Co-evaluation               AM-PAC PT 6 Clicks Mobility  Outcome Measure Help needed turning from your back to your side while in a flat bed without using bedrails?: A Little Help needed moving from lying on your back to sitting on the side of a flat bed without using bedrails?: A Little Help needed  moving to and from a bed to a chair (including a wheelchair)?: A Little Help needed standing up from a chair using your arms (e.g., wheelchair or bedside chair)?: A Lot (mod cues, min A) Help needed to walk in hospital room?: A Lot (mod cues/min A) Help needed climbing 3-5 steps with a railing? : A Lot 6 Click Score: 15    End of Session Equipment Utilized During Treatment: Gait belt Activity Tolerance: Patient tolerated treatment well Patient left: Other (comment) (In ED stretcher - hallway pt; set up food tray) Nurse Communication: Mobility status PT Visit Diagnosis:  Other abnormalities of gait and mobility (R26.89);Muscle weakness (generalized) (M62.81)    Time: 8357-8299 PT Time Calculation (min) (ACUTE ONLY): 18 min   Charges:   PT Evaluation $PT Eval Low Complexity: 1 Low   PT General Charges $$ ACUTE PT VISIT: 1 Visit         Benjiman, PT Acute Rehab [facility] [phone]   Benjiman VEAR [place] 09/02/2023, 5:47 PM

## 2023-09-02 NOTE — ED Notes (Signed)
 Assisted pt to bathroom in wheelchair, pt very unsteady on feet.

## 2023-09-02 NOTE — Progress Notes (Addendum)
 CSW spoke with pt at bedside who reports she is interested in LTC at [facility] and states she does not wish to return to her aunts house. The pt reports she does receive disability. Pt is agreeable to information being faxed out outside of [facility].   Addend @ 3:34 PM Spoke with EDP who will place order for PT.

## 2023-09-03 MED ORDER — AMLODIPINE BESYLATE 5 MG PO TABS
5.0000 mg | ORAL_TABLET | Freq: Once | ORAL | Status: DC
  Filled 2023-09-03: qty 1

## 2023-09-03 NOTE — ED Notes (Addendum)
 Pt seems very confused, asked multiply times to lay back down after multiple attempts of walking away, pt listen well, slur speech. Pt resting

## 2023-09-03 NOTE — NC FL2 (Signed)
 [place]  MEDICAID FL2 LEVEL OF CARE FORM     IDENTIFICATION  Patient Name: Christina [name] Birthdate: 03-Jul-1971 Sex: female Admission Date (Current Location): 09/01/2023  [facility] and [place] Number:  Producer, Television/film/video and Address:  [facility],  [address], [place] [address]      Provider Number: [id number]  Attending Physician Name and Address:  Neysa Clap, DO  Relative Name and Phone Number:       Current Level of Care: Hospital Recommended Level of Care: Skilled Nursing Facility Prior Approval Number:    Date Approved/Denied:   PASRR Number: [id number]  Discharge Plan: SNF    Current Diagnoses: Patient Active Problem List   Diagnosis Date Noted   Herpes zoster 04/29/2023   Sepsis (HCC) 04/29/2023   HIV (human immunodeficiency virus infection) (HCC) 04/29/2023   Generalized weakness 04/29/2023   Falls 04/29/2023   Hyponatremia 04/29/2023   Hypokalemia 04/29/2023   Cellulitis 04/28/2023   MDD (major depressive disorder), single episode, severe , no psychosis (HCC) 10/21/2018   Edema, peripheral 10/03/2018   MDD (major depressive disorder), recurrent episode, severe (HCC) 09/22/2018   Lumbar herniated disc 09/30/2017   Hypertension 05/22/2017   Migraine without aura and with status migrainosus, not intractable 05/22/2017   Tachycardia 05/22/2017   Insomnia 03/04/2017   Chronic bilateral low back pain with bilateral sciatica 01/26/2017   Depression with anxiety 01/26/2017   DVT (deep venous thrombosis) (HCC) 11/03/2016    Orientation RESPIRATION BLADDER Height & Weight     Self, Place, Situation, Time  Normal Continent Weight:   Height:     BEHAVIORAL SYMPTOMS/MOOD NEUROLOGICAL BOWEL NUTRITION STATUS      Continent Diet (Regular)  AMBULATORY STATUS COMMUNICATION OF NEEDS Skin   Limited Assist Verbally Normal                       Personal Care Assistance Level of Assistance  Bathing, Feeding, Dressing Bathing Assistance:  Limited assistance Feeding assistance: Limited assistance Dressing Assistance: Limited assistance     Functional Limitations Info  Sight, Hearing, Speech Sight Info: Adequate Hearing Info: Adequate Speech Info: Impaired (slurred due to previous stroke)    SPECIAL CARE FACTORS FREQUENCY  PT (By licensed PT), OT (By licensed OT)     PT Frequency: x5/week OT Frequency: x5/week            Contractures Contractures Info: Not present    Additional Factors Info  Code Status, Allergies Code Status Info: Full Allergies Info: No Known Allergies           Current Medications (09/03/2023):  This is the current hospital active medication list No current facility-administered medications for this encounter.   Current Outpatient Medications  Medication Sig Dispense Refill   acetaminophen  (TYLENOL ) 325 MG tablet Take 2 tablets (650 mg total) by mouth every 6 (six) hours as needed for mild pain, moderate pain or headache. (Patient not taking: Reported on 09/02/2023)     amLODipine  (NORVASC ) 5 MG tablet Take 1 tablet (5 mg total) by mouth daily. (Patient not taking: Reported on 09/02/2023) 30 tablet 2   bictegravir-emtricitabine -tenofovir  AF (BIKTARVY ) 50-200-25 MG TABS tablet Take 1 tablet by mouth daily. (Patient not taking: Reported on 09/02/2023) 30 tablet 0   escitalopram  (LEXAPRO ) 10 MG tablet Take 1 tablet (10 mg total) by mouth daily. (Patient not taking: Reported on 09/02/2023) 30 tablet 2   oxyCODONE -acetaminophen  (PERCOCET/ROXICET) 5-325 MG tablet Take 1-2 tablets by mouth every 8 (eight) hours as  needed for severe pain. (Patient not taking: Reported on 09/02/2023) 6 tablet 0   sulfamethoxazole -trimethoprim  (BACTRIM  DS) 800-160 MG tablet Take 1 tablet by mouth 3 (three) times a week. (Patient not taking: Reported on 09/02/2023) 12 tablet 0     Discharge Medications: Please see discharge summary for a list of discharge medications.  Relevant Imaging Results:  Relevant Lab  Results:   Additional Information [id number]  Christina JONETTA Daisy, LCSW

## 2023-09-03 NOTE — ED Provider Notes (Signed)
 Emergency Medicine Observation Re-evaluation Note  Taheerah Guldin is a 53 y.o. female, seen on rounds today.  Pt initially presented to the ED for complaints of Nasal Congestion Currently, the patient is resting  Physical Exam  BP (!) 182/127 (BP Location: Left Arm)   Pulse 80   Temp 97.7 F (36.5 C) (Axillary)   Resp 17   SpO2 100%  Physical Exam General: NAD Cardiac: RR Lungs: Clear Psych: Calm   ED Course / MDM  EKG:   I have reviewed the labs performed to date as well as medications administered while in observation.  Recent changes in the last 24 hours include none.  Plan  Current plan is for inpatient care. BP has been elevated; appears to have been on amlodipine  in the past will re-order.     Neysa Caron PARAS, DO 09/03/23 1401

## 2023-09-03 NOTE — Progress Notes (Signed)
Bed offers pending.  

## 2023-09-04 MED ORDER — IBUPROFEN 200 MG PO TABS
400.0000 mg | ORAL_TABLET | Freq: Once | ORAL | Status: AC
  Administered 2023-09-04: 400 mg via ORAL
  Filled 2023-09-04: qty 2

## 2023-09-04 MED ORDER — ACETAMINOPHEN 325 MG PO TABS
650.0000 mg | ORAL_TABLET | Freq: Once | ORAL | Status: AC
  Administered 2023-09-04: 650 mg via ORAL
  Filled 2023-09-04: qty 2

## 2023-09-04 NOTE — Progress Notes (Signed)
 CSW spoke with pt's great aunt, Emelia Deis, who reports she is unable to care for the pt any longer. She states she is 54+ years old and has fallen herself trying to care for pt. She reports pt is unable to return to her home. She took this CSW's number for pt's sister to give this clinical research associate a call. TOC supervisor aware of barriers to discharge.

## 2023-09-04 NOTE — ED Provider Notes (Signed)
 Emergency Medicine Observation Re-evaluation Note  Christina [name] is a 53 y.o. female, seen on rounds today.  Pt initially presented to the ED for complaints of Nasal Congestion Currently, the patient is resting.  Physical Exam  BP (!) 197/117 (BP Location: Right Arm)   Pulse 65   Temp 98 F (36.7 C) (Oral)   Resp 14   SpO2 98%  Physical Exam General: NAD Lungs: no distress  Psych: calm  ED Course / MDM  EKG:   I have reviewed the labs performed to date as well as medications administered while in observation.  Recent changes in the last 24 hours include no change, pending placement.  Plan  Current plan is for placement.    Elnor Jayson LABOR, DO 09/04/23 [phone]

## 2023-09-04 NOTE — Progress Notes (Signed)
 PT Cancellation Note  Patient Details Name: Christina [name] MRN: [id number] DOB: [birth year]   Cancelled Treatment:    Reason Eval/Treat Not Completed: Patient declined, no reason specified  Pt resting with eyes closed.  Was able to arouse pt with tactile and verbal cues.  She looked at therapist but did not speak and then rolled onto L side facing wall. Therapist tried to encourage pt , but she kept eyes closed.  NT reports pt lethargic all day - has not ate breakfast or lunch.  Will f/u later date. Benjiman, PT Acute Rehab [facility] [phone]  Benjiman VEAR [place] 09/04/2023, 5:09 PM

## 2023-09-05 NOTE — ED Provider Notes (Signed)
 Emergency Medicine Observation Re-evaluation Note  Christina [name] is a 53 y.o. female, seen on rounds today.  Pt initially presented to the ED for complaints of Nasal Congestion Currently, the patient is asleep in bed.  Physical Exam  BP (!) 169/112 (BP Location: Left Arm)   Pulse 74   Temp 98.6 F (37 C) (Oral)   Resp 16   SpO2 98%  Physical Exam General: Asleep, no acute distress Cardiac: Regular rate Lungs: No increased work of breathing Psych: Calm, sleep  ED Course / MDM  EKG:   I have reviewed the labs performed to date as well as medications administered while in observation.  Recent changes in the last 24 hours include patient remains medically cleared.  Recommended for SNF.  Plan  Current plan is for SNF.    Kingsley, Kayleeann Huxford K, DO 09/05/23 [phone]

## 2023-09-05 NOTE — ED Notes (Signed)
 Patient will not eat.. She has been giving breakfast and lunch and will not eat.   I woke her up and she stays up for about 10 minutes then goes back to sleep.

## 2023-09-06 MED ORDER — ACETAMINOPHEN 500 MG PO TABS
500.0000 mg | ORAL_TABLET | Freq: Four times a day (QID) | ORAL | Status: DC | PRN
  Administered 2023-09-06 – 2023-09-30 (×34): 500 mg via ORAL
  Filled 2023-09-06 (×37): qty 1

## 2023-09-06 NOTE — ED Notes (Signed)
 Pt sat up and encouraged to eat. Pt eating very little at this time, keeps repeating that she would like to go. Pt asked what we can do to make her more comfortable, pt denies anything, and repeats that she wants to go.

## 2023-09-06 NOTE — ED Notes (Signed)
 Christina [name] is displaying increasing confusion as evidenced by her removing all of her clothes and swearing at staff who attempted to help her redress herself.

## 2023-09-06 NOTE — ED Provider Notes (Signed)
 Emergency Medicine Observation Re-evaluation Note  Christina [name] is a 53 y.o. female, seen on rounds today.  Pt initially presented to the ED for complaints of Nasal Congestion Currently, the patient is asleep, no nursing staff complaints or concerns.  Physical Exam  BP (!) 166/95 (BP Location: Left Arm)   Pulse 85   Temp 98.7 F (37.1 C) (Oral)   Resp 16   SpO2 100%  Physical Exam Physical Exam General: No acute distress Cardiac: Regular rate Lungs: No respiratory distress Psych: Currently calm  ED Course / MDM  EKG:   I have reviewed the labs performed to date as well as medications administered while in observation.  Recent changes in the last 24 hours include -no new changes.  Patient awaiting social work placement.  Plan  Current plan is for us  to hold the patient until she is placed.    Charlyn Sora, MD 09/06/23 [phone]

## 2023-09-07 MED ORDER — TRAZODONE HCL 100 MG PO TABS
50.0000 mg | ORAL_TABLET | Freq: Every day | ORAL | Status: DC
  Administered 2023-09-07 – 2023-09-29 (×22): 50 mg via ORAL
  Filled 2023-09-07 (×23): qty 1

## 2023-09-07 MED ORDER — LISINOPRIL 10 MG PO TABS
10.0000 mg | ORAL_TABLET | Freq: Every day | ORAL | Status: DC
  Administered 2023-09-07 – 2023-09-30 (×23): 10 mg via ORAL
  Filled 2023-09-07 (×23): qty 1

## 2023-09-07 MED ORDER — BICTEGRAVIR-EMTRICITAB-TENOFOV 50-200-25 MG PO TABS
1.0000 | ORAL_TABLET | Freq: Every day | ORAL | Status: DC
  Administered 2023-09-07 – 2023-09-30 (×24): 1 via ORAL
  Filled 2023-09-07 (×24): qty 1

## 2023-09-07 MED ORDER — AMLODIPINE BESYLATE 5 MG PO TABS
5.0000 mg | ORAL_TABLET | Freq: Every day | ORAL | Status: DC
  Administered 2023-09-07 – 2023-09-30 (×23): 5 mg via ORAL
  Filled 2023-09-07 (×23): qty 1

## 2023-09-07 NOTE — ED Notes (Signed)
 Patient reporting she is ready to go home.

## 2023-09-07 NOTE — ED Notes (Signed)
 Patient provided with sandwich and beverage at this time

## 2023-09-07 NOTE — ED Notes (Signed)
 Patient resting in bed with eyes open and sitter present

## 2023-09-07 NOTE — ED Notes (Signed)
 Patient gown changed and room cleaned at this time

## 2023-09-07 NOTE — ED Notes (Signed)
 Pt lying on stretcher NAD. Medications taken.

## 2023-09-07 NOTE — ED Provider Notes (Addendum)
 Emergency Medicine Observation Re-evaluation Note  Christina [name] is a 53 y.o. female, seen on rounds today.  Pt initially presented to the ED for complaints of Nasal Congestion Currently, the patient is asleep, RN reports BP has been running high, not currently on any medications.  Physical Exam  BP (!) 181/109 (BP Location: Right Arm)   Pulse 67   Temp 97.7 F (36.5 C) (Oral)   Resp 16   SpO2 100%  Physical Exam General: Asleep, no acute distress Cardiac: Regular rate Lungs: No increased work of breathing Psych: Calm, sleep  ED Course / MDM  EKG:   I have reviewed the labs performed to date as well as medications administered while in observation.  Recent changes in the last 24 hours include patient remains medically cleared, recommended for SNF and pending placement. Will reorder home BP meds as well as home Biktarvy , per pharmacy reconciliation has been out of meds since September.  Plan  Current plan is for SNF.    Kingsley, Jeananne Bedwell K, DO 09/07/23 [phone]    Kingsley, Vida Nicol K, DO 09/07/23 [phone]

## 2023-09-07 NOTE — ED Notes (Signed)
 Patient resting in bed breathing eyes closed with sitter present

## 2023-09-07 NOTE — Progress Notes (Signed)
 TOC supervisor contacting staff at [facility] to inquire about LTC bed.

## 2023-09-07 NOTE — ED Notes (Addendum)
 Reported to Provider Alvester Chou patient BP 178/114

## 2023-09-07 NOTE — Progress Notes (Signed)
 CSW received a call back from Christina [name] [phone]) who is the pt's CM. Christina will search for in network SNFs with the pt's current insurance and also inquire about the pt's eligibility to switch from current Medicaid to LTC Medicaid to access more SNFs. Christina will contact this writer back with findings.

## 2023-09-07 NOTE — Progress Notes (Signed)
 CSW outreached to [facility], [place], who reports to her knowledge, [facility] does not manage Skilled Nursing Facilities due to it not being a funded service. Delilah did report that the pt is managed by [facility] - who is the Tailored Care Management provider. TOC supervisor has requested that PQA initiate a change from [facility] to LTC Medicaid. TOC following.

## 2023-09-07 NOTE — ED Notes (Signed)
 Patient's BP 181/109.  MD notified.

## 2023-09-07 NOTE — ED Notes (Signed)
 Assumed care of patient. MD made aware of elevated BP

## 2023-09-08 NOTE — Progress Notes (Signed)
 Physical Therapy Treatment Patient Details Name: Christina [name] MRN: [id number] DOB: 1971/01/07 Today's Date: 09/08/2023   History of Present Illness Pt is 53 yo female who presented to ED on 09/01/23 for congestion and not wanting to be home with her aunt. Per note aunt reports difficulty taking care of pt. Respiratory panel negative, refuses UA, chest xray negative.  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    PT Comments  Pt lethargic and with very limited participation today despite max encouragement/education and increased time.  Tried to involve pt and determine her goals/wants for session but she was unable to state.  She required total A to transfer to EOB.  Sat EOB for ~12 mins with min/mod A with attempts to stand but pt not assisting.  Increased time for session -as trying to encourage/educate pt to try what she could.   Limited by cognition vs self limiting - also noting in nursing documentation that pt often refuses to eat. Patient will benefit from continued inpatient follow up therapy, <3 hours/day at d/c.    If plan is discharge home, recommend the following: Assistance with cooking/housework;Help with stairs or ramp for entrance;A lot of help with walking and/or transfers;A lot of help with bathing/dressing/bathroom   Can travel by private vehicle     No  Equipment Recommendations  None recommended by PT    Recommendations for Other Services       Precautions / Restrictions Precautions Precautions: Fall     Mobility  Bed Mobility   Bed Mobility: Supine to Sit, Sit to Supine     Supine to sit: HOB elevated, Total assist Sit to supine: Max assist   General bed mobility comments: Pt not assisting to transfer to EOB, used HOB elevated and bed pad for transfers. For return to bed pt helping minimally with legs    Transfers                   General transfer comment: Attempted multiple  encouragements/attempts to stand but pt not assisting.  Tried to encourage for strengthening, repositioning, progression, in order to get to rehab, etc but pt not participating.  Asked pt what she wanted - no response    Ambulation/Gait                   Stairs             Wheelchair Mobility     Tilt Bed    Modified Rankin (Stroke Patients Only)       Balance Overall balance assessment: Needs assistance Sitting-balance support: No upper extremity supported Sitting balance-Leahy Scale: Poor Sitting balance - Comments: Pt leaning to R and L in slumped posture.  She did hold briefly for about 30 seconds without assist but otherwise min-mod A for 12 mins at EOB                                    Cognition Arousal: Lethargic Behavior During Therapy: Flat affect (self limiting) Overall Cognitive Status: Difficult to assess                                 General Comments: Pt with limited speaking.  Stated you're pretty to therapist then answered no further questions throughout session except said I'm hungry  when back in bed.  Pt followed no commands and did not work with therapy despite increased time and encouragement        Exercises      General Comments General comments (skin integrity, edema, etc.): Would not participate with exercises      Pertinent Vitals/Pain Pain Assessment Pain Assessment: No/denies pain    Home Living                          Prior Function            PT Goals (current goals can now be found in the care plan section) Progress towards PT goals: Not progressing toward goals - comment (cognition/self limiting)    Frequency    Min 1X/week      PT Plan      Co-evaluation              AM-PAC PT 6 Clicks Mobility   Outcome Measure  Help needed turning from your back to your side while in a flat bed without using bedrails?: A Lot Help needed moving from lying on your  back to sitting on the side of a flat bed without using bedrails?: Total Help needed moving to and from a bed to a chair (including a wheelchair)?: Total Help needed standing up from a chair using your arms (e.g., wheelchair or bedside chair)?: Total Help needed to walk in hospital room?: Total Help needed climbing 3-5 steps with a railing? : Total 6 Click Score: 7    End of Session Equipment Utilized During Treatment: Gait belt Activity Tolerance: Other (comment) (Limited by cognition vs self limiting) Patient left: in bed;with call bell/phone within reach;with bed alarm set Nurse Communication: Mobility status PT Visit Diagnosis: Other abnormalities of gait and mobility (R26.89);Muscle weakness (generalized) (M62.81)     Time: 8552-8487 PT Time Calculation (min) (ACUTE ONLY): 25 min  Charges:    $Therapeutic Activity: 23-37 mins PT General Charges $$ ACUTE PT VISIT: 1 Visit                     Benjiman, PT Acute Rehab [facility] [phone]    Benjiman VEAR Mulberry 09/08/2023, 3:31 PM

## 2023-09-08 NOTE — ED Provider Notes (Signed)
 Emergency Medicine Observation Re-evaluation Note  Christina [name] is a 53 y.o. female, seen on rounds today.  Pt initially presented to the ED for complaints of rhinorrhea and inability to be cared for in her home environment Currently, the patient is sleeping.  Physical Exam  BP (!) 157/106 (BP Location: Right Arm)   Pulse 78   Temp 98 F (36.7 C) (Oral)   Resp 16   SpO2 100%  Physical Exam General: Sleeping Cardiac: Regular rate and rhythm Lungs: No increased work of breathing Psych: Sleeping  ED Course / MDM  EKG:   I have reviewed the labs performed to date as well as medications administered while in observation.  Recent changes in the last 24 hours include none.  Plan  Current plan is for placement.    Garrick Charleston, MD 09/08/23 [phone]

## 2023-09-09 NOTE — Progress Notes (Signed)
 CSW received call from [place] who states pt's care is managed through [facility]. She has reported she has outreached to them via email and will update this Clinical research associate.

## 2023-09-09 NOTE — ED Provider Notes (Signed)
 Emergency Medicine Observation Re-evaluation Note  Christina [name] is a 53 y.o. female, seen on rounds today.  Pt initially presented to the ED for complaints of Nasal Congestion Currently, the patient is sleeping.  Physical Exam  BP (!) 130/95 (BP Location: Right Arm)   Pulse 82   Temp 98.7 F (37.1 C) (Oral)   Resp 16   SpO2 100%  Physical Exam General: No acute distress, sleeping Cardiac: Regular rate Lungs: No respiratory distress Psych: Calm and cooperative  ED Course / MDM  EKG:   I have reviewed the labs performed to date as well as medications administered while in observation.  Recent changes in the last 24 hours include none.  Plan  Current plan is for placement.    Carin Charleston, MD 09/09/23 [phone]

## 2023-09-09 NOTE — Progress Notes (Signed)
 CSW attempted to contact Christina [name] with [employer] [phone]) without success. Left HIPAA Compliant voicemail requesting call back.

## 2023-09-10 ENCOUNTER — Encounter (HOSPITAL_COMMUNITY): Payer: Self-pay

## 2023-09-10 NOTE — ED Notes (Signed)
Pt has not eaten very much today.  She slept thru breakfast and lunch.  She nibbled at dinner.

## 2023-09-10 NOTE — ED Provider Notes (Signed)
Emergency Medicine Observation Re-evaluation Note  Christina [name] is a 53 y.o. female, seen on rounds today.  Pt initially presented to the ED for complaints of Nasal Congestion Currently, the patient is .Calm and cooperative  Physical Exam  BP 125/85 (BP Location: Right Arm)   Pulse 75   Temp 98.5 F (36.9 C) (Oral)   Resp 16   SpO2 100%  Physical Exam General: Awake. Alert. No acute distress Cardiac: Regular rate rhythm Lungs: Clear to auscultation bilaterally Psych: Calm and cooperative  ED Course / MDM  EKG:   I have reviewed the labs performed to date as well as medications administered while in observation.  Recent changes in the last 24 hours include: Patient refused trazodone overnight and did not sleep much through the night, otherwise no other issues.  Plan  Current plan is for continued boarding in the ED awaiting skilled nursing facility placement.    Royanne Foots, DO 09/10/23 [phone]

## 2023-09-10 NOTE — Progress Notes (Signed)
  PT Cancellation Note  Patient Details Name: Christina [name] MRN: [id number] DOB: [birth year]   Cancelled Treatment:    Reason Eval/Treat Not Completed: Fatigue/lethargy limiting ability to participate Per RN, patient awake all night, trying to get up frequently. Now sleeping and request to not awaken.  Will check back another time. Blanchard Kelch PT Acute Rehabilitation Services Office [phone] Weekend [phone]  Rada Hay 09/10/2023, 8:27 AM

## 2023-09-11 NOTE — ED Provider Notes (Signed)
Emergency Medicine Observation Re-evaluation Note  Christina [name] is a 53 y.o. female, seen on rounds today.  Pt initially presented to the ED for complaints of Possible nursing home placement Currently, the patient is restomg.  Physical Exam  BP (!) 150/110 (BP Location: Right Arm) Comment: pt did not get her bp meds yesterday  Pulse 66   Temp (!) 97.3 F (36.3 C) (Oral)   Resp 18   SpO2 100%  Physical Exam General: mad Cardiac: regular rate Lungs: normal effort   ED Course / MDM  EKG:   I have reviewed the labs performed to date as well as medications administered while in observation.  Recent changes in the last 24 hours include no events.  Plan  Current plan is for nursing home placement.Linwood Dibbles, MD 09/11/23 0800

## 2023-09-11 NOTE — Progress Notes (Signed)
CSW awaiting response from pt's CM, Christina [name], with [facility]. TOC following.

## 2023-09-11 NOTE — ED Notes (Signed)
Pt was provided with breakfast.

## 2023-09-12 ENCOUNTER — Emergency Department (HOSPITAL_COMMUNITY): Payer: MEDICAID

## 2023-09-12 NOTE — ED Notes (Addendum)
Pt return from CT scan head tolerated well

## 2023-09-12 NOTE — ED Provider Notes (Signed)
Head CT without any acute findings which is very reassuring.   Vanetta Mulders, MD 09/12/23 [phone]

## 2023-09-12 NOTE — ED Notes (Signed)
The pt has been napping since about 12 pm and has remained calm and cooperative throughout the day. She requested to have her posey belt removed earlier today. Posey belt is on because it was passed on to me that pt tries to get out of bed although she has a left side weakness.

## 2023-09-12 NOTE — ED Provider Notes (Signed)
Emergency Medicine Observation Re-evaluation Note  Christina [name] is a 53 y.o. female, seen on rounds today.  Pt initially presented to the ED for complaints of Possible nursing home placement Currently, the patient is awaiting nursing home placement..  Physical Exam  BP (!) 151/108   Pulse 77   Temp 98.2 F (36.8 C) (Oral)   Resp 16   SpO2 100%  Physical Exam General: Alert Cardiac:  Lungs: No respiratory distress Psych: Behavior baseline.  Also no new neurodeficits.  ED Course / MDM  EKG:   I have reviewed the labs performed to date as well as medications administered while in observation.  Recent changes in the last 24 hours include patient apparently's been complaining of a headache for the past 4 days.  Patient's been receiving Tylenol for this she says it does not help.  Since patient's does have a history of hypertension blood pressures been a little bit more elevated at times.  Has had previous stroke.  Will get CT head just to rule out any concerns for bleed.  She does not have any new neurodeficits.  So that part is reassuring..  Plan  Current plan is for still awaiting nursing home placement.  Based on the complaint of headache we will going get CT head just to rule out any acute process.Vanetta Mulders, MD 09/12/23 [phone]

## 2023-09-12 NOTE — ED Notes (Signed)
The patient is in bed with a posey belt on. She is awake and asked about breakfast. Informed her that breakfast will be here soon. She is calm and not agitated.

## 2023-09-12 NOTE — ED Notes (Signed)
Introduced self to patient. She request some coffee an the posey belt to be removed. Removed per her request. Pt cooperative and calm.

## 2023-09-12 NOTE — ED Notes (Signed)
Pt transport in bed to CT scan for head scan related to c/o headache. No new neuro deficits noted.

## 2023-09-13 MED ORDER — LORAZEPAM 1 MG PO TABS
1.0000 mg | ORAL_TABLET | Freq: Once | ORAL | Status: AC
  Administered 2023-09-13: 1 mg via ORAL
  Filled 2023-09-13: qty 1

## 2023-09-13 NOTE — ED Notes (Signed)
Pt care taken, resting, wanting to be changed.

## 2023-09-13 NOTE — ED Provider Notes (Signed)
Emergency Medicine Observation Re-evaluation Note  Christina [name] is a 53 y.o. female, seen on rounds today.  Pt initially presented to the ED for complaints of Possible nursing home placement Currently, the patient is awaiting skilled nursing placement.  Physical Exam  BP 111/86 (BP Location: Left Arm)   Pulse 70   Temp (!) 97.5 F (36.4 C) (Oral)   Resp 18   SpO2 100%  Physical Exam General: No acute distress Cardiac: Normal heart rate and blood pressure Lungs: No respiratory distress with normal respiratory rate and oxygen saturation Psych: No acute psychiatric disorders noted  ED Course / MDM  EKG:   I have reviewed the labs performed to date as well as medications administered while in observation.  Recent changes in the last 24 hours include patient had head CT yesterday which was normal.  Plan  Current plan is for skilled nursing placement.    Margarita Grizzle, MD 09/13/23 [phone]

## 2023-09-13 NOTE — ED Notes (Signed)
Pt changed and given some water.

## 2023-09-14 MED ORDER — ONDANSETRON 4 MG PO TBDP
4.0000 mg | ORAL_TABLET | Freq: Three times a day (TID) | ORAL | Status: DC | PRN
  Administered 2023-09-14 – 2023-09-21 (×2): 4 mg via ORAL
  Filled 2023-09-14 (×3): qty 1

## 2023-09-14 NOTE — ED Notes (Signed)
Pt. Was dry when checked to be changed.

## 2023-09-14 NOTE — Progress Notes (Signed)
Physical Therapy Treatment Patient Details Name: Christina [name] MRN: [id number] DOB: 02/15/1971 Today's Date: 09/14/2023   History of Present Illness Pt is 53 yo female who presented to ED on 09/01/23 for congestion and not wanting to be home with her aunt. Per note aunt reports difficulty taking care of pt. Respiratory panel negative, refuses UA, chest xray negative.  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    PT Comments  Patient  reporting having a HA. Patient participated in mobility and stood and took a few steps along the bed. May be able to progress to ambulate if continues to be  able to follow and participate .  Continue PT    If plan is discharge home, recommend the following: Assistance with cooking/housework;Help with stairs or ramp for entrance;A lot of help with walking and/or transfers;A lot of help with bathing/dressing/bathroom   Can travel by private vehicle     No  Equipment Recommendations  None recommended by PT    Recommendations for Other Services       Precautions / Restrictions Precautions Precautions: Fall Restrictions Weight Bearing Restrictions Per Provider Order: No     Mobility  Bed Mobility Overal bed mobility: Needs Assistance Bed Mobility: Sidelying to Sit, Rolling Rolling: Supervision, Used rails Sidelying to sit: Used rails, Mod assist Supine to sit: Contact guard     General bed mobility comments: patient able to to roll , multimodal cues to palce legs over bed edge, then patient able to self assist pushing self upright. Able to scoot  self to bed edge.    Transfers Overall transfer level: Needs assistance   Transfers: Sit to/from Stand Sit to Stand: Min assist           General transfer comment: Stood holding onto back of a chair. patient able to side step with multimodal cues and delay as patient  reports ing her  concerns for noone listens to her.     Ambulation/Gait                   Stairs             Wheelchair Mobility     Tilt Bed    Modified Rankin (Stroke Patients Only)       Balance Overall balance assessment: Needs assistance Sitting-balance support: No upper extremity supported Sitting balance-Leahy Scale: Fair     Standing balance support: Bilateral upper extremity supported, Reliant on assistive device for balance, During functional activity Standing balance-Leahy Scale: Poor Standing balance comment: back of chair ,                            Cognition Arousal: Alert Behavior During Therapy: Flat affect, Restless Overall Cognitive Status: Difficult to assess Area of Impairment: Problem solving, Memory, Following commands, Awareness, Safety/judgement                     Memory: Decreased short-term memory   Safety/Judgement: Decreased awareness of safety Awareness: Emergent Problem Solving: Slow processing, Decreased initiation, Requires verbal cues General Comments: patiebnt states " You're not listening to me, Nobody listens to me. I wcan't call my family, they won't let me. Patient did follow simple directions at her pace.        Exercises      General Comments        Pertinent Vitals/Pain Pain Assessment Faces Pain  Scale: Hurts little more Pain Location: HA Pain Descriptors / Indicators: Aching Pain Intervention(s): Patient requesting pain meds-RN notified, Monitored during session    Home Living                          Prior Function            PT Goals (current goals can now be found in the care plan section) Progress towards PT goals: Progressing toward goals    Frequency    Min 1X/week      PT Plan      Co-evaluation              AM-PAC PT "6 Clicks" Mobility   Outcome Measure  Help needed turning from your back to your side while in a flat bed without using bedrails?: None Help needed moving from lying on  your back to sitting on the side of a flat bed without using bedrails?: A Lot Help needed moving to and from a bed to a chair (including a wheelchair)?: A Lot Help needed standing up from a chair using your arms (e.g., wheelchair or bedside chair)?: A Lot Help needed to walk in hospital room?: Total Help needed climbing 3-5 steps with a railing? : Total 6 Click Score: 12    End of Session   Activity Tolerance: Patient tolerated treatment well Patient left: in bed;with call bell/phone within reach;with bed alarm set;with nursing/sitter in room Nurse Communication: Mobility status PT Visit Diagnosis: Other abnormalities of gait and mobility (R26.89);Muscle weakness (generalized) (M62.81)     Time: 1415-1430 PT Time Calculation (min) (ACUTE ONLY): 15 min  Charges:    $Therapeutic Activity: 8-22 mins PT General Charges $$ ACUTE PT VISIT: 1 Visit                     Blanchard Kelch PT Acute Rehabilitation Services Office [phone] Weekend pager-[phone]    Rada Hay 09/14/2023, 5:10 PM

## 2023-09-14 NOTE — ED Provider Notes (Signed)
Emergency Medicine Observation Re-evaluation Note  Christina [name] is a 53 y.o. female, seen on rounds today.  Pt initially presented to the ED for complaints of Possible nursing home placement Currently, the patient is sleeping.  Physical Exam  BP (!) 147/103 (BP Location: Right Arm)   Pulse 78   Temp 97.7 F (36.5 C) (Oral)   Resp 16   SpO2 99%  Physical Exam General: No acute distress and sleeping Cardiac: Regular rate Lungs: No respiratory distress Psych: Calm and cooperative  ED Course / MDM  EKG:   I have reviewed the labs performed to date as well as medications administered while in observation.  Recent changes in the last 24 hours include no issues overnight.  Plan  Current plan is for waiting for skilled nursing placement.    Gwyneth Sprout, MD 09/14/23 [phone]

## 2023-09-15 NOTE — ED Provider Notes (Signed)
Emergency Medicine Observation Re-evaluation Note  Christina [name] is a 53 y.o. female, seen on rounds today.  Pt initially presented to the ED for complaints of Possible nursing home placement Currently, the patient is sleeping.  Physical Exam  BP 126/86 (BP Location: Left Arm)   Pulse 68   Temp 97.8 F (36.6 C) (Oral)   Resp 17   SpO2 100%  Physical Exam General: No acute distress Lungs: No respiratory distress Psych: Calm and cooperative, sleeping  ED Course / MDM  EKG:   I have reviewed the labs performed to date as well as medications administered while in observation.  Recent changes in the last 24 hours include none.  Plan  Current plan is for skilled nursing facility placement.    Durwin Glaze, MD 09/15/23 [phone]

## 2023-09-15 NOTE — Progress Notes (Signed)
CSW waiting update from [place] with [facility].
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
## 2023-09-16 NOTE — Progress Notes (Signed)
CSW outreached to [place] with [employer] [phone]) to request update on whether [facility] has provided updates regarding pt's eligibility for SNF. Left HIPAA Compliant voicemail requesting a call back.

## 2023-09-17 NOTE — Progress Notes (Signed)
Physical Therapy Treatment Patient Details Name: Christina [name] MRN: [id number] DOB: 02/16/1971 Today's Date: 09/17/2023   History of Present Illness Pt is 53 yo female who presented to ED on 09/01/23 for congestion and not wanting to be home with her aunt. Per note aunt reports difficulty taking care of pt. Respiratory panel negative, refuses UA, chest xray negative.  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    PT Comments  Patient participated in mobility, requesting to walk to BR. Patient ambulated x 20' x 2, staggering but able to maintain stability. Recommend Ambulation  with Rw as much as possible.    If plan is discharge home, recommend the following: Assistance with cooking/housework;Help with stairs or ramp for entrance;A lot of help with walking and/or transfers;A lot of help with bathing/dressing/bathroom   Can travel by private vehicle     No  Equipment Recommendations       Recommendations for Other Services       Precautions / Restrictions Precautions Precautions: Fall Restrictions Weight Bearing Restrictions Per Provider Order: No     Mobility  Bed Mobility Overal bed mobility: Needs Assistance       Supine to sit: Supervision Sit to supine: Supervision        Transfers   Equipment used: Rolling walker (2 wheels) Transfers: Sit to/from Stand Sit to Stand: Min assist           General transfer comment: cues to hold Rw    Ambulation/Gait Ambulation/Gait assistance: Min assist Gait Distance (Feet): 20 Feet (x 2)   Gait Pattern/deviations: Step-to pattern, Step-through pattern, Staggering right, Drifts right/left, Staggering left       General Gait Details: Patient  ambulated with RW , cues to maintain  hold   Stairs             Wheelchair Mobility     Tilt Bed    Modified Rankin (Stroke Patients Only)       Balance Overall balance assessment: Needs  assistance Sitting-balance support: No upper extremity supported, Feet supported Sitting balance-Leahy Scale: Fair     Standing balance support: Bilateral upper extremity supported, Reliant on assistive device for balance, During functional activity Standing balance-Leahy Scale: Poor                              Cognition Arousal: Alert Behavior During Therapy: Restless, Anxious, Lability                         Memory: Decreased short-term memory Following Commands: Follows one step commands inconsistently       General Comments: patient more focused on  activity, neede to use BR for BM        Exercises      General Comments        Pertinent Vitals/Pain Pain Assessment Pain Assessment: No/denies pain    Home Living                          Prior Function            PT Goals (current goals can now be found in the care plan section) Progress towards PT goals: Progressing toward goals    Frequency    Min 1X/week      PT Plan      Co-evaluation  AM-PAC PT "6 Clicks" Mobility   Outcome Measure  Help needed turning from your back to your side while in a flat bed without using bedrails?: None Help needed moving from lying on your back to sitting on the side of a flat bed without using bedrails?: None Help needed moving to and from a bed to a chair (including a wheelchair)?: None Help needed standing up from a chair using your arms (e.g., wheelchair or bedside chair)?: A Little Help needed to walk in hospital room?: A Lot Help needed climbing 3-5 steps with a railing? : Total 6 Click Score: 18    End of Session Equipment Utilized During Treatment: Gait belt Activity Tolerance: Patient tolerated treatment well Patient left: in bed;with call bell/phone within reach;with nursing/sitter in room Nurse Communication: Mobility status PT Visit Diagnosis: Other abnormalities of gait and mobility (R26.89);Muscle  weakness (generalized) (M62.81)     Time: 1610-9604 PT Time Calculation (min) (ACUTE ONLY): 29 min  Charges:    $Gait Training: 23-37 mins PT General Charges $$ ACUTE PT VISIT: 1 Visit                     Blanchard Kelch PT Acute Rehabilitation Services Office [phone] Weekend [phone]    Rada Hay 09/17/2023, 4:11 PM

## 2023-09-17 NOTE — ED Provider Notes (Signed)
Emergency Medicine Observation Re-evaluation Note  Christina [name] is a 53 y.o. female, seen on rounds today.  Pt initially presented to the ED for complaints of housing instability. No new c/o this AM.   Physical Exam  BP 122/79 (BP Location: Right Arm)   Pulse 73   Temp 97.9 F (36.6 C) (Oral)   Resp 18   SpO2 100%  Physical Exam General: resting.  Cardiac: regular rate.  Lungs: breathing comfortably.  Psych: calm.  ED Course / MDM   I have reviewed the labs performed to date as well as medications administered while in observation.  Recent changes in the last 24 hours include ED obs, reassessment.   Plan  Current plan is for TOC placement. From review of records, it appears TOC progress has been very slow - will ask team to intensify efforts, and encourage community partner/stakeholder to do the same.     Cathren Laine, MD 09/17/23 [phone]

## 2023-09-17 NOTE — Progress Notes (Signed)
CSW outreached to [place] via email. Awaiting response.

## 2023-09-17 NOTE — Progress Notes (Signed)
Tammy with [facility] will complete an in person assessment later today for possible admission into LTC.

## 2023-09-17 NOTE — ED Notes (Signed)
Patient calm all day but states that no one cares for her and everyone is mean.

## 2023-09-17 NOTE — ED Notes (Signed)
Animal control called to see if patient will be discharged today, advised that she would not. Stated that they were called out of aunts home due to an aggressive cat that they would have to take.

## 2023-09-18 NOTE — ED Provider Notes (Signed)
Emergency Medicine Observation Re-evaluation Note  Suriyah Vergara is a 53 y.o. female, seen on rounds today.  Pt initially presented to the ED for complaints of Possible nursing home placement Currently, the patient is resting and sleeping comfortably.  Physical Exam  BP (!) 130/95 (BP Location: Left Arm)   Pulse 84   Temp (!) 97.5 F (36.4 C) (Axillary)   Resp 18   SpO2 100%  Physical Exam General: Resting without acute distress Cardiac: Not tachycardic on last vitals Lungs: Symmetric rise and fall of chest wall sleeping Psych: No agitation  ED Course / MDM  EKG:   I have reviewed the labs performed to date as well as medications administered while in observation.  Recent changes in the last 24 hours include none reported by overnight nursing.  Plan  Current plan is for awaiting transition of care placement.    Osie Amparo, Canary Brim, MD 09/18/23 [phone]

## 2023-09-18 NOTE — Progress Notes (Signed)
Patient has been calm and cooperative this shift. She wanted to get some rest and requested night medication early.

## 2023-09-18 NOTE — Progress Notes (Signed)
Tammy with [facility] is checking on availability at facilities for LTC bed.   CSW spoke with pt's sister, Henrietta Hoover who confirmed neither she or her aunt are HCPOA. Tammy states she or her aunt would be willing to become POA if pt will be at [facility] for LTC. Tammy confirmed pt's check goes direct deposit and she (pt) manages her own money.

## 2023-09-18 NOTE — Progress Notes (Signed)
Chaplain received a referral from social work to provide paperwork and education around [place].  Chaplain brought paperwork, but Christina [name] does not have capacity to fill out paperwork at this time.   [address] [place] Pager, [phone]

## 2023-09-18 NOTE — ED Notes (Signed)
Patient constantly stating that no one is doing anything for her and she is being treated with no respect. Bath given. Currently eating dinner.

## 2023-09-19 NOTE — Care Management (Addendum)
Messaged  by Social work that the patient needs a walker 4 wheeled with seat. [employer] will be delivering. Discussed with Nursing and Provider. 1700 was told that the patient refused the walker when delivered, and now wants to stay because she cannot find her wallet.

## 2023-09-19 NOTE — Progress Notes (Signed)
CSW met with the patient per her request. The pt was asked orientation questions and was oriented x4. pt expressed concerns about her stay at the hospital, specifically regarding her cat and her belongings in storage. She is requesting to leave the hospital, stating she feels she is being treated 'like a dog.' Pt explained that she needs to leave to pay for her storage and find out about her cat. CSW reminded the pt why she is in the hospital, noting that her family stated she is unable to care for herself and needs LTC placement. Pt responded, 'I have been caring for myself for over 50 years.' She reported that prior to staying at her aunt's house, she had been living independently.  Pt mentioned that she had only been at her aunt's home for two weeks before being hospitalized. She also stated that she was staying at a hotel on her own, and that she would receive help from [employer]. pt confirmed she has access to her money and food stamps. She stated, 'I don't need any help from you all if this is the help you provide.'" Pt is requesting a walker at time of d/c.   CSW attempted to confirm the pt's finances for the hotel, but the pt's wallet was not in the [employer]. Pt reported having her wallet when she came to the hospital but is now confused about its whereabouts, stating that a nurse tech on the 14th helped her buy food with her debit card. CSW suggested the pt contact her family for assistance, and the pt eventually gave permission to do so. CSW attempted to reach the pt's aunt, but there was no answer and a voicemail could not be left as the phone just rang. pt did not know her sister's contact information. Pt was agitated and refused the rollator, DME was taken back by the [employer] rep. TOC to follow.   Valentina Shaggy.Makara Lanzo, MSW, LCSWA [facility]  Transitions of Care Clinical Social Worker I Direct Dial: [phone]  Fax: [phone] [email or web address]
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
## 2023-09-19 NOTE — ED Notes (Signed)
Patient refused walker

## 2023-09-19 NOTE — ED Notes (Signed)
Called social work to come talk with patient. She is at bedside.

## 2023-09-19 NOTE — ED Provider Notes (Signed)
Emergency Medicine Observation Re-evaluation Note  Christina [name] is a 53 y.o. female, seen on rounds today.  Pt initially presented to the ED for complaints of Possible nursing home placement Currently, the patient is sleep .  Physical Exam  BP (!) 134/94 (BP Location: Left Arm)   Pulse 83   Temp (!) 97.5 F (36.4 C) (Axillary)   Resp 18   SpO2 100%  Physical Exam General: sleeping   ED Course / MDM  EKG:   I have reviewed the labs performed to date as well as medications administered while in observation.  Recent changes in the last 24 hours include nothing.  Plan  Current plan is for TOC placement.    Virgina Norfolk, DO 09/19/23 [phone]
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
## 2023-09-20 NOTE — ED Provider Notes (Signed)
Emergency Medicine Observation Re-evaluation Note  Christina [name] is a 53 y.o. female, seen on rounds today.  Pt initially presented to the ED for complaints of Possible nursing home placement Currently, the patient is pleasent.  Physical Exam  BP (!) 125/95 (BP Location: Left Arm) Comment: Primary nurse notified  Pulse 84   Temp 97.7 F (36.5 C) (Oral)   Resp 12   SpO2 100%  Physical Exam General: awake   ED Course / MDM  EKG:   I have reviewed the labs performed to date as well as medications administered while in observation.  Recent changes in the last 24 hours include nothing.  Plan  Current plan is for TOC placement.    Virgina Norfolk, DO 09/20/23 [phone]

## 2023-09-21 NOTE — ED Provider Notes (Signed)
Emergency Medicine Observation Re-evaluation Note  Christina [name] is a 53 y.o. female, seen on rounds today.  Pt initially presented to the ED for complaints of Possible nursing home placement Currently, the patient is resting and sleeping comfortably.  Physical Exam  BP 106/74 (BP Location: Right Arm)   Pulse 76   Temp 97.8 F (36.6 C) (Oral)   Resp 18   SpO2 100%  Physical Exam General: Resting without acute distress Lungs: Symmetric rise and fall of chest wall sleeping Psych: resting comfortably  ED Course / MDM  EKG:   I have reviewed the labs performed to date as well as medications administered while in observation.  Recent changes in the last 24 hours include none reported by overnight nursing.  Plan  Current plan is for awaiting transition of care placement.      Melene Plan, DO 09/21/23 [phone]
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
## 2023-09-21 NOTE — Progress Notes (Addendum)
CSW retrieved pt's belongings along with Engineer, materials, Nedra Hai from pt's sister, Babette Relic. Belongings were placed into pt's Locker 29 with another white "patients belongings" bag.   The bag provided by Tammy was a [employer] which Tammy reports included the pt's pocket book (black), a white coat, and pants. Tammy stated the pt's 2 cell phones, SS card, DL, EBT and Debit card are in the pocket book. CSW informed pt at bedside who became tearful and thanked this Clinical research associate.   Pt inquired about whether she could see the belongings and at least call to check balances on her EBT and bank card.Pt also states she would like to check the belongings to retrieve her daughters phone number. CSW informed she'd ask the RN.  CSW spoke with RN who stated she doesn't believe she's allowed to have the belongings but states the pt can use the hospital phone if needed.   CSW explained to pt that we are continuing to search for a SNF(LTC) placement and reiterated that her entire check but $70 will go to the facility.

## 2023-09-22 NOTE — Progress Notes (Signed)
Physical Therapy Treatment Patient Details Name: Christina [name] MRN: [id number] DOB: 10/10/1970 Today's Date: 09/22/2023   History of Present Illness Pt is 53 yo female who presented to ED on 09/01/23 for congestion and not wanting to be home with her aunt. Per note aunt reports difficulty taking care of pt. Respiratory panel negative, refuses UA, chest xray negative.  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    PT Comments  Pt is progressing well with mobility, she tolerated increased ambulation distance of 70' with RW, no loss of balance, verbal cues for positioning in RW. Pt verbalized significant frustration about wanting to leave the hospital, and wanting access to her phone and purse which are in a hospital locker, RN notified.     If plan is discharge home, recommend the following: Assistance with cooking/housework;Help with stairs or ramp for entrance;A little help with bathing/dressing/bathroom   Can travel by private vehicle     Yes  Equipment Recommendations  None recommended by PT    Recommendations for Other Services       Precautions / Restrictions Precautions Precautions: Fall Restrictions Weight Bearing Restrictions Per Provider Order: No     Mobility  Bed Mobility Overal bed mobility: Modified Independent Bed Mobility: Supine to Sit     Supine to sit: Modified independent (Device/Increase time), Used rails, HOB elevated          Transfers Overall transfer level: Needs assistance Equipment used: Rolling walker (2 wheels) Transfers: Sit to/from Stand Sit to Stand: Contact guard assist           General transfer comment: VCs hand placement    Ambulation/Gait Ambulation/Gait assistance: Contact guard assist Gait Distance (Feet): 80 Feet Assistive device: Rolling walker (2 wheels) Gait Pattern/deviations: Step-to pattern, Step-through pattern, Drifts right/left, Trunk flexed Gait velocity:  decreased     General Gait Details: steady, no loss of balance, VCs for proximity to [employer] Mobility     Tilt Bed    Modified Rankin (Stroke Patients Only)       Balance                                            Cognition Arousal: Alert Behavior During Therapy: Restless, Anxious, Lability Overall Cognitive Status: Difficult to assess Area of Impairment: Problem solving, Memory, Following commands, Awareness, Safety/judgement                       Following Commands: Follows one step commands inconsistently Safety/Judgement: Decreased awareness of safety     General Comments: pt very frustrated about not being able to leave the hospital and not being able to have her purse and cell phone        Exercises      General Comments        Pertinent Vitals/Pain Pain Assessment Faces Pain Scale: Hurts even more Pain Location: HA Pain Descriptors / Indicators: Aching Pain Intervention(s): Limited activity within patient's tolerance, Monitored during session, Patient requesting pain meds-RN notified    Home Living                          Prior Function  PT Goals (current goals can now be found in the care plan section) Acute Rehab PT Goals Patient Stated Goal: find another place to live, get her phone and purse back (per chart they are in locker in hospital) PT Goal Formulation: With patient Time For Goal Achievement: 10/06/23 Potential to Achieve Goals: Fair Progress towards PT goals: Progressing toward goals    Frequency    Min 1X/week      PT Plan      Co-evaluation              AM-PAC PT "6 Clicks" Mobility   Outcome Measure  Help needed turning from your back to your side while in a flat bed without using bedrails?: None Help needed moving from lying on your back to sitting on the side of a flat bed without using bedrails?: None Help needed moving to  and from a bed to a chair (including a wheelchair)?: None Help needed standing up from a chair using your arms (e.g., wheelchair or bedside chair)?: A Little Help needed to walk in hospital room?: A Little Help needed climbing 3-5 steps with a railing? : A Lot 6 Click Score: 20    End of Session Equipment Utilized During Treatment: Gait belt Activity Tolerance: Patient tolerated treatment well Patient left: in bed;with call bell/phone within reach;with nursing/sitter in room (pt has a Comptroller) Nurse Communication: Mobility status PT Visit Diagnosis: Other abnormalities of gait and mobility (R26.89);Muscle weakness (generalized) (M62.81)     Time: 7829-5621 PT Time Calculation (min) (ACUTE ONLY): 15 min  Charges:    $Gait Training: 8-22 mins PT General Charges $$ ACUTE PT VISIT: 1 Visit                     Tamala Ser PT 09/22/2023  Acute Rehabilitation Services  Office [phone]

## 2023-09-22 NOTE — ED Provider Notes (Signed)
Emergency Medicine Observation Re-evaluation Note  Christina [name] is a 53 y.o. female, seen on rounds today.  Pt initially presented to the ED for complaints of Possible nursing home placement Currently, the patient is sleeping.  Physical Exam  BP 109/74 (BP Location: Right Arm)   Pulse 78   Temp 97.9 F (36.6 C) (Oral)   Resp 18   SpO2 100%  Physical Exam General: No acute distress Cardiac: Well-perfused Lungs: Nonlabored Psych: Calm  ED Course / MDM  EKG:   I have reviewed the labs performed to date as well as medications administered while in observation.  Recent changes in the last 24 hours include no significant changes.  Plan  Current plan is for placement.    Terrilee Files, MD 09/22/23 [phone]

## 2023-09-23 NOTE — Progress Notes (Addendum)
Reyne Dumas (regional rep) has declined pt at all [facility] facilities. Pt has no bed offers at this time.   Rep with [facility] would like to visit pt tomorrow morning to enroll pt in services so that she will be followed in the community by case managers.Per chart review, pt walked 80 ft with RW and was contact guard assist and steady gait and no loss of balance on 1/28.   CSW will discuss discharge plan with [facility] leadership. Pt has previously inquired about discharging to a hotel stating she would use her funds and adamant that she does not wish to return to "[facility] As noted previously, [facility] (aunt) stated pt cannot return there as she is elderly and unable to care for pt.
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
## 2023-09-23 NOTE — Progress Notes (Addendum)
CSW attempted to Call Christina [name] at [phone] in which the patient stated that this was the number. AT this time Christina [name] did not answer CSW has left HIPAA compliant VM. CSW also reached out to patient's daughter Christina [name]. The phone went straight to VM. CSW has informed the patient's nurse of the attempts. TOC will continue to follow.

## 2023-09-23 NOTE — Progress Notes (Signed)
CSW has tried to call the daughter back 2 times. AT this time the daughter has not called back to inform of next steps for DC. RN was made aware. TOC will continue to follow.  [place] Jameka Ivie LCSW-A   09/23/2023 8:46 PM

## 2023-09-23 NOTE — Progress Notes (Addendum)
CSW called again and finally reached the patient's daughter Christina [name]. Christina [name] reported she was not aware that her mother was in the emergency department. CSW asked the daughter if she could come and get the patient. Christina [name] stated that she does not have a car to get her mother.  Christina [name] reported that the aunt was caring for the mother. CSW has made brittany aware that the patient stated that she can not go back to the aunts house. Christina [name] told this CSW that she is going to call around to see where the patient can possibly go. TOC will continue to follow.    Christina Brigetta Beckstrom LCSW-A   09/23/2023 7:59 PM

## 2023-09-23 NOTE — Progress Notes (Addendum)
CSW received call back from [place] with APS who reports that the case was screened out; however, has been forwarded to the [facility] Attorney's office and [employer].  Addend @ 1:35 PM CSW attempted to contact pt's daughter, [name], at the number listed in Emergency Contacts [phone]). Unable to leave a vm. Will try again.   Addend @ 2:37PM Per RN, pt has spoken with Mrs. Senaida Ores who she stated is her ex-boyfriends mother. She stated Mrs. Senaida Ores is on the way to the hospital. RN to inquire about whether Mrs. Senaida Ores can provide care or assist pt with securing a hotel. Per the pt, Mrs. Senaida Ores helped her manage her funds and bills in the past.

## 2023-09-24 MED ORDER — TRAZODONE HCL 50 MG PO TABS
50.0000 mg | ORAL_TABLET | Freq: Once | ORAL | Status: AC
Start: 2023-09-24 — End: 2023-09-24
  Administered 2023-09-24: 50 mg via ORAL
  Filled 2023-09-24: qty 1

## 2023-09-24 NOTE — Progress Notes (Signed)
CSW spoke with pt's daughter, Christina [name] who stated she is "couch surfing" herself and does not feel she could be of much assistance to her mom. Christina [name] reported she'd contacted her brother, Christina [name] [phone]) to see how he could assist. Christina [name] is out of state but previously offered for the pt to live with him per Christina [name]. This Clinical research associate also attempted [place] without success and left a HIPAA Compliant voicemail requesting a call back.   CSW has notified TOC leadership. CSW will continue to attempt to contact the pt's son, Christina [name], and sister, Christina [name] to discuss a discharge plan.

## 2023-09-24 NOTE — Progress Notes (Addendum)
CSW has attempted to contact pt's daughter twice without success. Unable to leave a vm.   Addend @ 9:54 AM Meryle Ready with [employer] called this writer and informed she came this morning to enroll the member in Tailored Care Management services and the pt refused to sign. The services are to establish supports outpatient. CSW explained to Okey Regal that we are attempting to contact the pt's daughter to provide transportation to her home. As noted previously, this Clinical research associate has attempted pt's daughter multiple times without success and cannot leave a voicemail.

## 2023-09-24 NOTE — ED Notes (Signed)
Mrs. Plaisted request something for sleep order obtained for repeat dose trazodone 50 mg from EDP.

## 2023-09-24 NOTE — ED Notes (Addendum)
Patient calm and cooperative all day today, eating well, ambulating with walker.

## 2023-09-24 NOTE — Progress Notes (Signed)
Case discussed on Difficult To Place/ED [facility] conference call with ED director, TOC CSW's, and compliance.

## 2023-09-24 NOTE — ED Provider Notes (Signed)
Emergency Medicine Observation Re-evaluation Note  Christina [name] is a 53 y.o. female, seen on rounds today.  Pt initially presented to the ED for complaints of Possible nursing home placement Currently, the patient is sleeping.  Physical Exam  BP (!) 120/90 (BP Location: Right Arm)   Pulse 85   Temp 98 F (36.7 C) (Oral)   Resp 18   SpO2 100%  Physical Exam General: No acute distress Cardiac: Well-perfused Lungs: Nonlabored Psych: Calm  ED Course / MDM  EKG:   I have reviewed the labs performed to date as well as medications administered while in observation.  Recent changes in the last 24 hours include no significant changes.  Plan  Current plan is for placement.      Melene Plan, DO 09/24/23 [phone]

## 2023-09-24 NOTE — Progress Notes (Signed)
CSW spoke with pt's sister, Babette Relic, to inquire about whether she is willing to assist pt with a hotel. Tammy stated she has a lot on her plate and is unable to assist the pt. She reports the [place] called and cursed her out and therefore has no interest involving herself with the pt or her children any further. Tammy mentioned reaching out to [facility] who she states is the pt's oldest child; however, stated she does not have a number and only speaks with through social media.   CSW notified TOC supervisor of updates.

## 2023-09-24 NOTE — Progress Notes (Addendum)
CSW received a call from [place] the patient's daughter. [place] has requested to speak to 1st CSW, however this CSW has explained that, the CSW she spoke with earlier is gone for the day. This CSW explained to the daughter that  this CSW can assist with the patient's DC, as I was the CSW that contacted her last night 09/23/2023 in regards to the patient. [place] has reported that she has found a placement however, they would like to speak to the case worker that she has been working with today. [place] has requested the patient stay until she can speak with the "case worker" she spoke with today. CSW has brought this to leaderships attention. This CSW will await next steps. TOC Will continue to follow.   Addend@ 5:00  This CSW has attempted to call brittany back twice, phone goes straight to V/M    Addend@ 8:37 pm  This CSW has reached out to patients daughter again for any updates on patient's DC destination. At this time there was N/A CSW was able to leave a message requesting the daughter call the [facility] phone with any updates. TOC will continue to follow.

## 2023-09-25 NOTE — Progress Notes (Signed)
Physical Therapy Discharge Patient Details Name: Christina [name] MRN: [id number] DOB: [birth year] Today's Date: 09/25/2023 Time:  -     Patient discharged from PT services secondary to goals [name] and no further PT needs identified. Christina [name]. Will have Mobility specialist continue  with  ambulation  out of room.  Please see latest therapy progress note for current level of functioning and progress toward goals.   Patient observed ambulating in room with Rw with distant supervision from staff.    Progress and discharge plan discussed with patient and/or caregiver: Patient unable to participate in discharge planning and no caregivers available [facility] Acute Rehabilitation Services Office [phone] Weekend pager-[phone]  GP     Rada Hay 09/25/2023, 10:29 AM

## 2023-09-25 NOTE — Progress Notes (Signed)
CSW received call from [place] stating she found a place for the pt to go and requested this Clinical research associate give a call to Burtis Junes [phone]) who has a boarding house. Burtis Junes will contact Brittney to discuss further. CSW provided information regarding pt's ability based on PT eval from 1/28. TOC following.

## 2023-09-25 NOTE — ED Provider Notes (Signed)
Emergency Medicine Observation Re-evaluation Note  Christina [name] is a 53 y.o. female, seen on rounds today.  Pt initially presented to the ED for complaints of Possible nursing home placement Currently, the patient is resting.  Physical Exam  BP (!) 122/95 (BP Location: Right Arm)   Pulse 70   Temp 98.3 F (36.8 C) (Oral)   Resp 16   SpO2 95%  Physical Exam General: nad Cardiac: regular rate Lungs: normal effort Psych:   ED Course / MDM  EKG:   I have reviewed the labs performed to date as well as medications administered while in observation.  Recent changes in the last 24 hours include no acute changes.  Plan  Current plan is for placement.    Linwood Dibbles, MD 09/25/23 1100

## 2023-09-26 MED ORDER — IBUPROFEN 200 MG PO TABS
400.0000 mg | ORAL_TABLET | Freq: Once | ORAL | Status: AC
  Administered 2023-09-26: 400 mg via ORAL
  Filled 2023-09-26: qty 2

## 2023-09-26 MED ORDER — PROCHLORPERAZINE MALEATE 10 MG PO TABS
10.0000 mg | ORAL_TABLET | Freq: Once | ORAL | Status: AC
  Administered 2023-09-26: 10 mg via ORAL
  Filled 2023-09-26: qty 1

## 2023-09-26 NOTE — ED Notes (Signed)
Patient reports tylenol not helping headache. Provider notified.

## 2023-09-26 NOTE — Progress Notes (Addendum)
CSW attempted to contact pt's daughter, Christina [name], to inquire about details with pt transitioning to the boarding house. Left HIPAA  Compliant voicemail requesting a call back.   Addend @ 11:52AM CSW spoke with pt at bedside who is agreeable to discharge to boarding home. This writer attempted to contact pt's daughter again without success. Left HIPAA compliant voicemail requesting call back.

## 2023-09-27 NOTE — ED Provider Notes (Signed)
Emergency Medicine Observation Re-evaluation Note  Christina [name] is a 53 y.o. female, seen on rounds today.  Pt initially presented to the ED for complaints of Possible nursing home placement Currently, the patient is resting.  Physical Exam  BP 96/78 (BP Location: Left Arm)   Pulse 94   Temp 98.3 F (36.8 C) (Oral)   Resp 18   SpO2 100%  Physical Exam General: nad   ED Course / MDM  EKG:   I have reviewed the labs performed to date as well as medications administered while in observation.  Recent changes in the last 24 hours include no changes.  Plan  Current plan is for possible boarding house, placement.    Linwood Dibbles, MD 09/27/23 [phone]

## 2023-09-27 NOTE — ED Notes (Signed)
Patient requested HIV status remain confidential.  Do not disclose to anyone without patient's approval

## 2023-09-27 NOTE — ED Notes (Addendum)
Patient resting in bed watching TV breathing. Patient asked about going to live with her daughter candace or another family member. Patient verbalized she has been unable to get rest and her pressure that has elevated since she has been here due to the disturbance from other patients here in the ER. Patient was reassured that her pressure was 129/93. Patient wants social work aware that she can go stay with her daughter candace or possible another family member. Nurse will speak with dayshift RN to inquire about them reaching out to staff in the morning.  Patient encourage to try to get some rest at this time while unit is quiet.

## 2023-09-27 NOTE — Progress Notes (Signed)
CSW has attempted to contact pt's daughter whose phone is going straight to voicemail and unable to leave a message. Will try back later.

## 2023-09-27 NOTE — ED Notes (Signed)
Pt called daughter.  It was discovered that patient had cell phone in her room because she was asking for a charger.  We removed the phone and told pt we would get the phone numbers requested if we have a charger

## 2023-09-28 NOTE — ED Notes (Signed)
 Patient resting in bed breathing eyes closed

## 2023-09-28 NOTE — Progress Notes (Signed)
CSW received call from [place] who states he is supporting [place] in helping their mother. Peyton Najjar states he will contact [place] to "give the green light" on the boarding home. Peyton Najjar states the pt will likely be at the boarding house for two weeks and then move in with his aunt (his father's sister). CSW will approve Taxi voucher for discharge. As noted previously, [place] and her boyfriend will retrieve pt's belongings from pt's aunt Ruthie's house. Pt states she declined to sign for the walker two weekends ago because she has two walkers at her aunt Ruthie's house already. Will notify paramedic via secure chat.

## 2023-09-28 NOTE — ED Notes (Signed)
Patient resting in bed breathing eyes closed with sitter present

## 2023-09-28 NOTE — ED Provider Notes (Signed)
Emergency Medicine Observation Re-evaluation Note  Christina [name] is a 53 y.o. female, seen on rounds today.  Pt initially presented to the ED for complaints of Possible nursing home placement Currently, the patient is nursing home placement.  Physical Exam  BP 118/82 (BP Location: Right Arm)   Pulse 61   Temp 98.4 F (36.9 C) (Oral)   Resp 16   SpO2 95%  Physical Exam Resting comfortably  ED Course / MDM  EKG:   I have reviewed the labs performed to date as well as medications administered while in observation.  Recent changes in the last 24 hours include none.  Plan  Current plan is for nursing home placement.    Bethann Berkshire, MD 09/28/23 [phone]

## 2023-09-28 NOTE — Progress Notes (Signed)
CSW spoke with [place] who states she has not heard from [place] - Owner of the boarding home. [place] plans to contact [facility] to coordinate to see if pt can discharge there today. [place] and her boyfriend will retrieve pt's belongings from her aunts house at a later date once pt is settled. [place] reported she's spoken to her siblings and "they've washed their hands with our mom." CSW informed [place] that we are able to provide transportation to the boarding home.   [address] [place], [place] [address]

## 2023-09-28 NOTE — Progress Notes (Signed)
CSW attempted to contact pt's daughter without success. Unable to leave vm.   CSW met with pt at bedside to inform that [place] was waiting to speak with Shanta. Pt states her money reloaded to her bank on 2/1 and that food stamps will reload on the 15th. Pt is interested in going to a hotel today if boarding house does not come through. CSW informed we can provide transportation at discharge. Pt wishes for CSW to speak with [place] because the hotel she wishes to discharge to she can't recall the name but states [place] would remember. CSW will try [place] again.

## 2023-09-29 NOTE — Progress Notes (Addendum)
 CSW spoke with pt's son, Birdia, along with pt at bedside and pt is still agreeable to boarding home. Birdia spoke with [facility] who reported the room will be ready tomorrow. Pt will transport by taxi.   RNCM has set pt up a PCP appt to follow up after hospital visit.

## 2023-09-29 NOTE — Progress Notes (Signed)
Patient reports her bank balance as 1760.13 on bank card and 63.00 on EBT card.  CM attempted to call son, unable to reach at this time.

## 2023-09-29 NOTE — ED Provider Notes (Signed)
 Emergency Medicine Observation Re-evaluation Note  Christina [name] is a 53 y.o. female, seen on rounds today.  Pt initially presented to the ED for complaints of Possible nursing home placement Currently, the patient is standing.  Physical Exam  BP 123/87 (BP Location: Left Arm)   Pulse 100   Temp 97.6 F (36.4 C) (Oral)   Resp 18   SpO2 99%  Physical Exam General: No distress Cardiac: regular rate Lungs: equal chest rise Psych: calm  ED Course / MDM  EKG:   I have reviewed the labs performed to date as well as medications administered while in observation.  Recent changes in the last 24 hours include none.  Plan  Current plan is for possible d/c to boarding house .    Francesca Elsie CROME, MD 09/29/23 [phone]

## 2023-09-29 NOTE — Progress Notes (Signed)
 CSW contacted patients son Birdia Meier to see if he can assist patient with a hotel room tonight. Birdia told CSW no because there is no family locally to help patient get to the boarding house tomorrow. CSW told Damian that CSW could provide a cab voucher to the hotel tonight and if they can assist patient with an uber to the boarding house. Birdia stated there are a lot of moving parts to this situation. Birdia stated that he still needs to provide payment to the boarding house if patient is willing to give her bank information to him. Birdia stated patient would do better at a hotel instead of the boarding house because she would have her own space. CSW did tell Birdia that patient wouldn't have much money left over if she stayed at a hotel monthly. Birdia stated he would rather wait until tomorrow so patient can go to the boarding house. Birdia states he plans on calling his mother to get the bank information to pay for the boarding house.

## 2023-09-29 NOTE — Progress Notes (Addendum)
Awaiting call back from pt's son, Peyton Najjar.   CSW spoke with Burtis Junes to inform Peyton Najjar will be calling to coordinate pt going to boarding home. CSW encouraged Peyton Najjar to contact Shanta as soon as possible. TOC following.

## 2023-09-29 NOTE — Care Management (Signed)
This RNCM scheduled first available PCP appointment with [facility] for 10/30/23 at 1pm, added to AVS.

## 2023-09-30 NOTE — Progress Notes (Addendum)
 CSW met with pt at bedside and contacted her son, Christina [name]. Pt is wanting to go to the [facility] for 1 week and then will transition to her ex sister in law's house. Christina [name] has contacted the [facility] and reserved her room. The room will be ready at 2:30PM. RN notified to call taxi and schedule pick up for 2:30PM. Pt's belongings here will discharge with her and family will workout retrieving belongings from aunt's house. CSW informed pt and son that she was set up a PCP appt and both are appreciative.

## 2023-09-30 NOTE — ED Provider Notes (Signed)
 Emergency Medicine Observation Re-evaluation Note  Christina [name] is a 53 y.o. female, seen on rounds today.  Pt initially presented to the ED for complaints of Possible nursing home placement Currently, the patient is asleep.  Physical Exam  BP (!) 147/110 (BP Location: Right Arm)   Pulse (!) 103   Temp 98.2 F (36.8 C) (Oral)   Resp 16   SpO2 96%  Physical Exam General: asleep Cardiac: asleep Lungs: asleep Psych: asleep  ED Course / MDM  EKG:   I have reviewed the labs performed to date as well as medications administered while in observation.  No recent changes in the last 24 hours.  Plan  Current plan is for social work placement.    Freddi Hamilton, MD 09/30/23 [phone]

## 2023-09-30 NOTE — Progress Notes (Signed)
 CSW will meet with pt at bedside to call son at 9:30. Will coordinate with son on discharge plan (hotel vs boarding house).

## 2023-09-30 NOTE — ED Provider Notes (Signed)
 Patient was apparently discharged prior to me being aware of this.  She was discharged to a motel.  I did not get a chance to reevaluate the patient.   Jerilynn Montenegro, MD 09/30/23 [phone]

## 2023-10-19 ENCOUNTER — Emergency Department (HOSPITAL_COMMUNITY): Payer: MEDICAID

## 2023-10-19 ENCOUNTER — Encounter (HOSPITAL_COMMUNITY): Payer: Self-pay

## 2023-10-19 ENCOUNTER — Inpatient Hospital Stay (HOSPITAL_COMMUNITY)
Admission: EM | Admit: 2023-10-19 | Discharge: 2023-10-24 | DRG: 065 | Disposition: A | Discharge status: Home / Self Care | Payer: MEDICAID | Attending: Family Medicine | Admitting: Family Medicine

## 2023-10-19 ENCOUNTER — Other Ambulatory Visit: Payer: Self-pay

## 2023-10-19 DIAGNOSIS — R29703 NIHSS score 3: Secondary | ICD-10-CM | POA: Diagnosis present

## 2023-10-19 DIAGNOSIS — R9431 Abnormal electrocardiogram [ECG] [EKG]: Secondary | ICD-10-CM | POA: Diagnosis present

## 2023-10-19 DIAGNOSIS — R7302 Impaired glucose tolerance (oral): Secondary | ICD-10-CM | POA: Diagnosis present

## 2023-10-19 DIAGNOSIS — B20 Human immunodeficiency virus [HIV] disease: Secondary | ICD-10-CM | POA: Diagnosis present

## 2023-10-19 DIAGNOSIS — G8191 Hemiplegia, unspecified affecting right dominant side: Secondary | ICD-10-CM | POA: Diagnosis present

## 2023-10-19 DIAGNOSIS — Z91148 Patient's other noncompliance with medication regimen for other reason: Secondary | ICD-10-CM

## 2023-10-19 DIAGNOSIS — F1210 Cannabis abuse, uncomplicated: Secondary | ICD-10-CM | POA: Diagnosis present

## 2023-10-19 DIAGNOSIS — Z7902 Long term (current) use of antithrombotics/antiplatelets: Secondary | ICD-10-CM

## 2023-10-19 DIAGNOSIS — Z5901 Sheltered homelessness: Secondary | ICD-10-CM

## 2023-10-19 DIAGNOSIS — F32A Depression, unspecified: Secondary | ICD-10-CM | POA: Diagnosis present

## 2023-10-19 DIAGNOSIS — Z7982 Long term (current) use of aspirin: Secondary | ICD-10-CM

## 2023-10-19 DIAGNOSIS — I639 Cerebral infarction, unspecified: Secondary | ICD-10-CM | POA: Diagnosis not present

## 2023-10-19 DIAGNOSIS — F419 Anxiety disorder, unspecified: Secondary | ICD-10-CM | POA: Diagnosis present

## 2023-10-19 DIAGNOSIS — E785 Hyperlipidemia, unspecified: Secondary | ICD-10-CM | POA: Diagnosis present

## 2023-10-19 DIAGNOSIS — R471 Dysarthria and anarthria: Secondary | ICD-10-CM | POA: Diagnosis present

## 2023-10-19 DIAGNOSIS — Z79899 Other long term (current) drug therapy: Secondary | ICD-10-CM

## 2023-10-19 DIAGNOSIS — R270 Ataxia, unspecified: Secondary | ICD-10-CM | POA: Diagnosis present

## 2023-10-19 DIAGNOSIS — F418 Other specified anxiety disorders: Secondary | ICD-10-CM | POA: Diagnosis present

## 2023-10-19 DIAGNOSIS — F17210 Nicotine dependence, cigarettes, uncomplicated: Secondary | ICD-10-CM | POA: Diagnosis present

## 2023-10-19 DIAGNOSIS — R29706 NIHSS score 6: Secondary | ICD-10-CM | POA: Diagnosis not present

## 2023-10-19 DIAGNOSIS — I6381 Other cerebral infarction due to occlusion or stenosis of small artery: Secondary | ICD-10-CM | POA: Diagnosis not present

## 2023-10-19 DIAGNOSIS — Z86718 Personal history of other venous thrombosis and embolism: Secondary | ICD-10-CM

## 2023-10-19 DIAGNOSIS — R1310 Dysphagia, unspecified: Secondary | ICD-10-CM | POA: Diagnosis present

## 2023-10-19 DIAGNOSIS — I3139 Other pericardial effusion (noninflammatory): Secondary | ICD-10-CM | POA: Diagnosis present

## 2023-10-19 DIAGNOSIS — Z91199 Patient's noncompliance with other medical treatment and regimen due to unspecified reason: Secondary | ICD-10-CM

## 2023-10-19 DIAGNOSIS — Z21 Asymptomatic human immunodeficiency virus [HIV] infection status: Secondary | ICD-10-CM | POA: Diagnosis not present

## 2023-10-19 DIAGNOSIS — I10 Essential (primary) hypertension: Secondary | ICD-10-CM | POA: Diagnosis present

## 2023-10-19 DIAGNOSIS — D72819 Decreased white blood cell count, unspecified: Secondary | ICD-10-CM | POA: Diagnosis present

## 2023-10-19 DIAGNOSIS — E8809 Other disorders of plasma-protein metabolism, not elsewhere classified: Secondary | ICD-10-CM | POA: Diagnosis present

## 2023-10-19 DIAGNOSIS — D696 Thrombocytopenia, unspecified: Secondary | ICD-10-CM | POA: Diagnosis not present

## 2023-10-19 LAB — TROPONIN I (HIGH SENSITIVITY)
Troponin I (High Sensitivity): 10 ng/L (ref <18)
Troponin I (High Sensitivity): 11 ng/L (ref <18)

## 2023-10-19 LAB — CBC WITH DIFFERENTIAL/PLATELET
Abs Immature Granulocytes: 0.01 10*3/uL (ref 0.00–0.07)
Basophils Absolute: 0 10*3/uL (ref 0.0–0.1)
Basophils Relative: 1 %
Eosinophils Absolute: 0 10*3/uL (ref 0.0–0.5)
Eosinophils Relative: 1 %
HCT: 43.5 % (ref 36.0–46.0)
Hemoglobin: 14 g/dL (ref 12.0–15.0)
Immature Granulocytes: 0 %
Lymphocytes Relative: 42 %
Lymphs Abs: 1.2 10*3/uL (ref 0.7–4.0)
MCH: 27.8 pg (ref 26.0–34.0)
MCHC: 32.2 g/dL (ref 30.0–36.0)
MCV: 86.3 fL (ref 80.0–100.0)
Monocytes Absolute: 0.2 10*3/uL (ref 0.1–1.0)
Monocytes Relative: 8 %
Neutro Abs: 1.4 10*3/uL — ABNORMAL LOW (ref 1.7–7.7)
Neutrophils Relative %: 48 %
Platelets: 160 10*3/uL (ref 150–400)
RBC: 5.04 MIL/uL (ref 3.87–5.11)
RDW: 17.2 % — ABNORMAL HIGH (ref 11.5–15.5)
WBC: 2.9 10*3/uL — ABNORMAL LOW (ref 4.0–10.5)
nRBC: 0 % (ref 0.0–0.2)

## 2023-10-19 LAB — COMPREHENSIVE METABOLIC PANEL
ALT: 16 U/L (ref 0–44)
AST: 24 U/L (ref 15–41)
Albumin: 3.2 g/dL — ABNORMAL LOW (ref 3.5–5.0)
Alkaline Phosphatase: 87 U/L (ref 38–126)
Anion gap: 13 (ref 5–15)
BUN: 5 mg/dL — ABNORMAL LOW (ref 6–20)
CO2: 22 mmol/L (ref 22–32)
Calcium: 8.7 mg/dL — ABNORMAL LOW (ref 8.9–10.3)
Chloride: 107 mmol/L (ref 98–111)
Creatinine, Ser: 0.9 mg/dL (ref 0.44–1.00)
GFR, Estimated: >60 mL/min (ref >60)
Glucose, Bld: 89 mg/dL (ref 70–99)
Potassium: 3.6 mmol/L (ref 3.5–5.1)
Sodium: 142 mmol/L (ref 135–145)
Total Bilirubin: 0.6 mg/dL (ref 0.0–1.2)
Total Protein: 7.8 g/dL (ref 6.5–8.1)

## 2023-10-19 LAB — RAPID URINE DRUG SCREEN, HOSP PERFORMED
Amphetamines: NOT DETECTED
Barbiturates: NOT DETECTED
Benzodiazepines: NOT DETECTED
Cocaine: NOT DETECTED
Opiates: NOT DETECTED
Tetrahydrocannabinol: POSITIVE — AB

## 2023-10-19 LAB — ETHANOL: Alcohol, Ethyl (B): <10 mg/dL (ref <10)

## 2023-10-19 MED ORDER — CLOPIDOGREL BISULFATE 75 MG PO TABS
75.0000 mg | ORAL_TABLET | Freq: Every day | ORAL | Status: DC
  Administered 2023-10-19 – 2023-10-24 (×6): 75 mg via ORAL
  Filled 2023-10-19 (×6): qty 1

## 2023-10-19 MED ORDER — ASPIRIN 81 MG PO TBEC
81.0000 mg | DELAYED_RELEASE_TABLET | Freq: Every day | ORAL | Status: DC
  Administered 2023-10-19 – 2023-10-24 (×6): 81 mg via ORAL
  Filled 2023-10-19 (×6): qty 1

## 2023-10-19 NOTE — ED Provider Notes (Signed)
 [place] EMERGENCY DEPARTMENT AT [facility] Provider Note   CSN: [id number] Arrival date & time: 10/19/23  1232     History  No chief complaint on file.   Christina [name] is a 53 y.o. female.  HPI Patient with multiple medical issues including HIV, prior stroke, baseline right-sided hemiparesis and speech difficulty presents with concern for worsening right-sided weakness, and worsening speech difficulty.  Patient states that she has been taking her medication as directed, but about 4 days ago began having difficulty with speech beyond baseline.  About that time she also noticed right sided weakness that was worse than usual.  It is difficult to understand if there was a specific area of worsening weakness.    Home Medications Prior to Admission medications   Medication Sig Start Date End Date Taking? Authorizing Provider  acetaminophen (TYLENOL) 325 MG tablet Take 2 tablets (650 mg total) by mouth every 6 (six) hours as needed for mild pain, moderate pain or headache. Patient not taking: Reported on 09/02/2023 10/28/18   Aldean Baker, NP  amLODipine (NORVASC) 5 MG tablet Take 1 tablet (5 mg total) by mouth daily. Patient not taking: Reported on 09/02/2023 05/05/23 05/04/24  Pokhrel, Rebekah Chesterfield, MD  bictegravir-emtricitabine-tenofovir AF (BIKTARVY) 50-200-25 MG TABS tablet Take 1 tablet by mouth daily. Patient not taking: Reported on 09/02/2023 05/05/23   Pokhrel, Rebekah Chesterfield, MD  escitalopram (LEXAPRO) 10 MG tablet Take 1 tablet (10 mg total) by mouth daily. Patient not taking: Reported on 09/02/2023 05/05/23 05/04/24  Joycelyn Das, MD  oxyCODONE-acetaminophen (PERCOCET/ROXICET) 5-325 MG tablet Take 1-2 tablets by mouth every 8 (eight) hours as needed for severe pain. Patient not taking: Reported on 09/02/2023 04/27/23   Benjiman Core, MD  sulfamethoxazole-trimethoprim (BACTRIM DS) 800-160 MG tablet Take 1 tablet by mouth 3 (three) times a week. Patient not taking: Reported on 09/02/2023  05/06/23   Joycelyn Das, MD      Allergies    Patient has no known allergies.    Review of Systems   Review of Systems  Physical Exam Updated Vital Signs BP (!) 164/128 (BP Location: Right Arm)   Pulse 83   Temp 97.9 F (36.6 C) (Oral)   Resp 18   Ht 5\' 5"  (1.651 m)   Wt 54 kg   LMP 09/22/2018   SpO2 98%   BMI 19.81 kg/m  Physical Exam Vitals and nursing note reviewed.  Constitutional:      General: She is not in acute distress.    Appearance: She is well-developed. She is ill-appearing.     Comments: Chronically ill-appearing thin adult female  HENT:     Head: Normocephalic and atraumatic.  Eyes:     Conjunctiva/sclera: Conjunctivae normal.  Cardiovascular:     Rate and Rhythm: Normal rate and regular rhythm.  Pulmonary:     Effort: Pulmonary effort is normal. No respiratory distress.     Breath sounds: Normal breath sounds. No stridor.  Abdominal:     General: There is no distension.  Skin:    General: Skin is warm and dry.  Neurological:     Mental Status: She is alert and oriented to person, place, and time.     Cranial Nerves: Cranial nerve deficit and dysarthria present.     Motor: Weakness, atrophy and abnormal muscle tone present.     Comments: Right-sided hemiparesis, though the patient cannot move right upper and lower extremities to command, strength is 2/5. Speech is difficult to understand due to dysarthria.  Patient  is  Psychiatric:        Mood and Affect: Mood normal.     ED Results / Procedures / Treatments   Labs (all labs ordered are listed, but only abnormal results are displayed) Labs Reviewed  COMPREHENSIVE METABOLIC PANEL - Abnormal; Notable for the following components:      Result Value   BUN 5 (*)    Calcium 8.7 (*)    Albumin 3.2 (*)    All other components within normal limits  CBC WITH DIFFERENTIAL/PLATELET - Abnormal; Notable for the following components:   WBC 2.9 (*)    RDW 17.2 (*)    Neutro Abs 1.4 (*)    All other  components within normal limits  ETHANOL  RAPID URINE DRUG SCREEN, HOSP PERFORMED  TROPONIN I (HIGH SENSITIVITY)  TROPONIN I (HIGH SENSITIVITY)    EKG EKG Interpretation Date/Time:  Monday October 19 2023 12:48:54 EST Ventricular Rate:  77 PR Interval:  122 QRS Duration:  68 QT Interval:  454 QTC Calculation: 513 R Axis:   254  Text Interpretation: Normal sinus rhythm Right superior axis deviation Low voltage QRS Cannot rule out Anterior infarct , age undetermined ST & T wave abnormality, consider inferolateral ischemia Prolonged QT Abnormal ECG When compared with ECG of 28-Apr-2023 20:17, PREVIOUS ECG IS PRESENT Confirmed by Lorre Nick ([id number]) on 10/19/2023 12:57:34 PM  Radiology MR Brain Wo Contrast (neuro protocol) Result Date: 10/19/2023 CLINICAL DATA:  Initial evaluation for acute neuro deficit, stroke suspected. EXAM: MRI HEAD WITHOUT CONTRAST TECHNIQUE: Multiplanar, multiecho pulse sequences of the brain and surrounding structures were obtained without intravenous contrast. COMPARISON:  Prior CT from earlier the same day. FINDINGS: Brain: Mild age-related cerebral atrophy. Patchy and confluent T2/FLAIR hyperintensity involving the periventricular and deep white matter both cerebral hemispheres as well as the pons, consistent with chronic small vessel ischemic disease, moderate to advanced in nature. Multiple superimposed remote lacunar infarcts present about the hemispheric cerebral white matter, deep gray nuclei, pons, and cerebellum. Few scattered subcentimeter foci of restricted diffusion are seen involving the right frontal and parietal lobes (series 5, images 66-63). Additional subcentimeter focus of diffusion signal abnormality noted in the region of the posterior left corpus callosum (series 5, image 62). Findings consistent with small evolving acute to early subacute ischemic infarcts. No associated hemorrhage or mass effect. No acute intracranial hemorrhage elsewhere. Few  scattered chronic micro hemorrhages noted about the pons and left cerebral hemisphere, likely hypertensive in nature. No mass lesion, midline shift or mass effect. No hydrocephalus or extra-axial fluid collection. Pituitary gland and suprasellar region within normal limits. Vascular: Major intracranial vascular flow voids are maintained. Skull and upper cervical spine: Craniocervical junction within normal limits. Decreased T1 signal intensity noted within the visualized bone marrow, nonspecific, but most commonly related to anemia, smoking or obesity. No scalp soft tissue abnormality. Sinuses/Orbits: Globes and orbital soft tissues within normal limits. Mild mucosal thickening noted about the ethmoidal air cells and maxillary sinuses. No significant mastoid effusion. Other: None. IMPRESSION: 1. Few scattered subcentimeter acute to early subacute ischemic infarcts involving the right frontal and parietal lobes as well as the left posterior corpus callosum. No associated hemorrhage or mass effect. 2. Underlying age-related cerebral atrophy with advanced chronic microvascular ischemic disease, with multiple remote lacunar infarcts involving the hemispheric cerebral white matter, deep gray nuclei, pons, and cerebellum. Electronically Signed   By: Rise Mu M.D.   On: 10/19/2023 20:51   CT Head Wo Contrast Result Date: 10/19/2023 CLINICAL DATA:  Neuro deficit, acute, stroke suspected. EXAM: CT HEAD WITHOUT CONTRAST TECHNIQUE: Contiguous axial images were obtained from the base of the skull through the vertex without intravenous contrast. RADIATION DOSE REDUCTION: This exam was performed according to the departmental dose-optimization program which includes automated exposure control, adjustment of the mA and/or kV according to patient size and/or use of iterative reconstruction technique. COMPARISON:  Head CT 09/12/2023 FINDINGS: Brain: There is no evidence of an acute infarct, intracranial hemorrhage,  mass, midline shift, or extra-axial fluid collection. Patchy hypodensities in the cerebral white matter bilaterally are similar to the prior CT and nonspecific but compatible with age advanced chronic small vessel ischemic disease with multiple chronic lacunar infarcts. There is unchanged mild cerebral atrophy. Vascular: No hyperdense vessel. Skull: No acute fracture or suspicious lesion. Sinuses/Orbits: Visualized paranasal sinuses and mastoid air cells are clear. Unremarkable orbits. Other: None. IMPRESSION: 1. No evidence of acute intracranial abnormality. 2. Age advanced chronic small vessel ischemic disease. Electronically Signed   By: Sebastian Ache M.D.   On: 10/19/2023 14:57    Procedures Procedures    Medications Ordered in ED Medications - No data to display  ED Course/ Medical Decision Making/ A&P                                 Medical Decision Making Adult female with multiple medical problems including HIV, prior stroke, presents with new weakness, dysarthria, concern for stroke versus mass versus progression of disease.  Cardiac 85 sinus normal Pulse ox 98% room air normal  Amount and/or Complexity of Data Reviewed External Data Reviewed: notes. Labs: ordered. Decision-making details documented in ED Course. Radiology: ordered and independent interpretation performed. ECG/medicine tests: ordered and independent interpretation performed. Decision-making details documented in ED Course.  Risk Prescription drug management. Decision regarding hospitalization. Diagnosis or treatment significantly limited by social determinants of health.   9:00 PM MRI consistent with acute to subacute scattered ischemic infarcts.  On repeat exam patient now more calm, sitting upright, but with continued dysarthria.  It is unclear if she has been taking her medication as previously prescribed.  Patient aware of all findings, aware of need for hospitalization have discussed her case with her  neurology colleagues, as above, patient was tried aspirin, Plavix, be admitted for further monitoring and management.        Final Clinical Impression(s) / ED Diagnoses Final diagnoses:  Acute CVA (cerebrovascular accident) [facility])    Rx / DC Orders ED Discharge Orders     None         Gerhard Munch, MD 10/19/23 2203

## 2023-10-19 NOTE — ED Notes (Signed)
 Pt transported to MRI

## 2023-10-19 NOTE — ED Notes (Signed)
 Pt states she has had right facial droop with slurred speech for a month and one week. Pt uses a walker to move around.

## 2023-10-19 NOTE — ED Provider Triage Note (Signed)
 Emergency Medicine Provider Triage Evaluation Note  Christina [name] , a 53 y.o. female  was evaluated in triage.  Pt complains of possibly having a stroke.  History of CVA in the past with right-sided deficits.  States that the symptoms have been going on for several weeks..  Patient uses a rollator at baseline  Review of Systems  Positive: Increased trouble walking Negative: No headaches  Physical Exam  BP (!) 157/111 (BP Location: Right Arm)   Pulse 72   Temp 98.3 F (36.8 C) (Oral)   Resp 18   Ht 1.651 m (5\' 5" )   Wt 54 kg   LMP 09/22/2018   SpO2 100%   BMI 19.81 kg/m  Gen:   Awake, no distress   Resp:  Normal effort  MSK:   Moves extremities without difficulty  Neuro: Alert and oriented x 4.  Right base gait noted which she states is her baseline.  Right-sided facial droop noted which is her baseline.  Dysarthric speech noted Other:    Medical Decision Making  Medically screening exam initiated at 12:42 PM.  Appropriate orders placed.  Ted Leonhart was informed that the remainder of the evaluation will be completed by another provider, this initial triage assessment does not replace that evaluation, and the importance of remaining in the ED until their evaluation is complete.     Lorre Nick, MD 10/19/23 [phone]

## 2023-10-19 NOTE — ED Notes (Signed)
 Pt given meal and drink with MD's permission

## 2023-10-19 NOTE — ED Notes (Signed)
 Pt did not answer for vital update called 3x will try again

## 2023-10-19 NOTE — ED Triage Notes (Signed)
 Pt bib PTAR c/o stroke. Pt was found naked at a motel by. Pt is Aox4. Pt believes she had a stroke 4 days ago. Pt deficit lisp and droop to mouth. Pt states she is in a lot of pain but unsure what it is. Pt states she is hungry and haven't eaten in awhile. Pt uses a walker. Pt appears to be malnourished.    BP 180/100 O2 97% HR 95 RR 20 CBG 101

## 2023-10-19 NOTE — ED Notes (Signed)
Hospitalist MD at bedside. 

## 2023-10-19 NOTE — Consult Note (Signed)
 NEUROLOGY CONSULT NOTE   Date of service: October 19, 2023 Patient Name: Christina [name] MRN:  [id number] DOB:  [birth year] Chief Complaint: "Strokes" Requesting Provider: Briscoe Deutscher, MD  History of Present Illness  Christina [name] is a 53 y.o. female with hx of HIV, dvts not compliant with medications who presents with 3-4 week hx of R facial droop, RUE and RLE weakness.  She came in to the ED since this was persistent and she was having a lot of trouble with walking. No prior hx of strokes.  Is not taking medications at home. Denies any side effects from medications. No clear reason that she could provide for not taking home meds.  LKW: 09/18/23. Modified rankin score: 0-Completely asymptomatic and back to baseline post- stroke IV Thrombolysis: not offered, outside window EVT: not offered, outside window   NIHSS components Score: Comment  1a Level of Conscious 0[x]  1[]  2[]  3[]      1b LOC Questions 0[x]  1[]  2[]       1c LOC Commands 0[x]  1[]  2[]       2 Best Gaze 0[x]  1[]  2[]       3 Visual 0[x]  1[]  2[]  3[]      4 Facial Palsy 0[]  1[x]  2[]  3[]      5a Motor Arm - left 0[x]  1[]  2[]  3[]  4[]  UN[]    5b Motor Arm - Right 0[]  1[x]  2[]  3[]  4[]  UN[]    6a Motor Leg - Left 0[x]  1[]  2[]  3[]  4[]  UN[]    6b Motor Leg - Right 0[x]  1[]  2[]  3[]  4[]  UN[]    7 Limb Ataxia 0[]  1[x]  2[]  3[]  UN[]     8 Sensory 0[x]  1[]  2[]  UN[]      9 Best Language 0[]  1[x]  2[]  3[]      10 Dysarthria 0[]  1[]  2[x]  UN[]      11 Extinct. and Inattention 0[x]  1[]  2[]       TOTAL: 6      ROS  =Comprehensive ROS performed and pertinent positives documented in HPI   Past History   Past Medical History:  Diagnosis Date   Anxiety    Back pain    DVT (deep venous thrombosis) (HCC)    left leg with stent    Past Surgical History:  Procedure Laterality Date   CORONARY ULTRASOUND/IVUS Left 11/03/2016   Procedure: Intravascular Ultrasound/IVUS;  Surgeon: Maeola Harman, MD;  Location: [facility] INVASIVE CV LAB;   Service: Cardiovascular;  Laterality: Left;   LOWER EXTREMITY VENOGRAPHY Left 11/04/2016   Procedure: Lower Extremity Venography;  Surgeon: Nada Libman, MD;  Location: [facility] INVASIVE CV LAB;  Service: Cardiovascular;  Laterality: Left;   PERIPHERAL VASCULAR INTERVENTION Left 11/04/2016   Procedure: Peripheral Vascular Intervention;  Surgeon: Nada Libman, MD;  Location: [facility] INVASIVE CV LAB;  Service: Cardiovascular;  Laterality: Left;  common external   PERIPHERAL VASCULAR THROMBECTOMY Left 11/03/2016   Procedure: Peripheral Vascular Thrombectomy;  Surgeon: Maeola Harman, MD;  Location: [facility] INVASIVE CV LAB;  Service: Cardiovascular;  Laterality: Left;    Family History: History reviewed. No pertinent family history.  Social History  reports that she has been smoking cigarettes. She has never used smokeless tobacco. She reports current alcohol use. She reports current drug use. Drug: Marijuana.  No Known Allergies  Medications   Current Facility-Administered Medications:    aspirin EC tablet 81 mg, 81 mg, Oral, Daily, Erick Blinks, MD, 81 mg at 10/19/23 2134   clopidogrel (PLAVIX) tablet 75 mg, 75 mg, Oral, Daily, Erick Blinks, MD, 75 mg at 10/19/23 2134  Current Outpatient Medications:    acetaminophen (TYLENOL) 325 MG tablet, Take 2 tablets (650 mg total) by mouth every 6 (six) hours as needed for mild pain, moderate pain or headache. (Patient not taking: Reported on 09/02/2023), Disp: , Rfl:    amLODipine (NORVASC) 5 MG tablet, Take 1 tablet (5 mg total) by mouth daily. (Patient not taking: Reported on 09/02/2023), Disp: 30 tablet, Rfl: 2   bictegravir-emtricitabine-tenofovir AF (BIKTARVY) 50-200-25 MG TABS tablet, Take 1 tablet by mouth daily. (Patient not taking: Reported on 09/02/2023), Disp: 30 tablet, Rfl: 0   escitalopram (LEXAPRO) 10 MG tablet, Take 1 tablet (10 mg total) by mouth daily. (Patient not taking: Reported on 09/02/2023), Disp: 30 tablet, Rfl: 2    oxyCODONE-acetaminophen (PERCOCET/ROXICET) 5-325 MG tablet, Take 1-2 tablets by mouth every 8 (eight) hours as needed for severe pain. (Patient not taking: Reported on 09/02/2023), Disp: 6 tablet, Rfl: 0   sulfamethoxazole-trimethoprim (BACTRIM DS) 800-160 MG tablet, Take 1 tablet by mouth 3 (three) times a week. (Patient not taking: Reported on 09/02/2023), Disp: 12 tablet, Rfl: 0  Vitals   Vitals:   10/19/23 1241 10/19/23 1642 10/19/23 1651 10/19/23 2006  BP:  (!) 160/125  (!) 164/128  Pulse:  81  83  Resp:  17  18  Temp:   98.1 F (36.7 C) 97.9 F (36.6 C)  TempSrc:   Oral Oral  SpO2:  98%  98%  Weight: 54 kg     Height: 5\' 5"  (1.651 m)       Body mass index is 19.81 kg/m.  Physical Exam    General: Laying comfortably in bed; in no acute distress.  HENT: Normal oropharynx and mucosa. Normal external appearance of ears and nose.  Neck: Supple, no pain or tenderness  CV: No JVD. No peripheral edema.  Pulmonary: Symmetric Chest rise. Normal respiratory effort.  Abdomen: Soft to touch, non-tender.  Ext: No cyanosis, edema, or deformity  Skin: No rash. Normal palpation of skin.   Musculoskeletal: Normal digits and nails by inspection. No clubbing.   Neurologic Examination  Mental status/Cognition: Alert, oriented to self, place, month and year, good attention.  Speech/language: non fluent, dysarthria, slight word finding difficulty, comprehension intact, object naming intact, repetition intact. Cranial nerves:   CN II Pupils equal and reactive to light, no VF deficits    CN III,IV,VI EOM intact, no gaze preference or deviation, no nystagmus    CN V normal sensation in V1, V2, and V3 segments bilaterally    CN VII R facial drrop   CN VIII normal hearing to speech    CN IX & X normal palatal elevation, no uvular deviation    CN XI 5/5 head turn and 5/5 shoulder shrug bilaterally    CN XII midline tongue protrusion    Motor:  Muscle bulk: poor, tone normal, pronator drift noted  in RUE Mvmt Root Nerve  Muscle Right Left Comments  SA C5/6 Ax Deltoid 4+ 5   EF C5/6 Mc Biceps 5 5   EE C6/7/8 Rad Triceps 5 5   WF C6/7 Med FCR     WE C7/8 PIN ECU     F Ab C8/T1 U ADM/FDI 5 5   HF L1/2/3 Fem Illopsoas 4+ 5   KE L2/3/4 Fem Quad 5 5   DF L4/5 D Peron Tib Ant 5 5   PF S1/2 Tibial Grc/Sol 5 5    Sensation:  Light touch Intact throughout   Pin prick    Temperature  Vibration   Proprioception    Coordination/Complex Motor:  - Finger to Nose intact BL - Heel to shin with RLE ataxia. - Rapid alternating movement are slowed on the right. - Gait: deferred  Labs/Imaging/Neurodiagnostic studies   CBC:  Recent Labs  Lab 11/08/2023 1254  WBC 2.9*  NEUTROABS 1.4*  HGB 14.0  HCT 43.5  MCV 86.3  PLT 160   Basic Metabolic Panel:  Lab Results  Component Value Date   NA 142 November 08, 2023   K 3.6 11/08/23   CO2 22 2023-11-08   GLUCOSE 89 08-Nov-2023   BUN 5 (L) 08-Nov-2023   CREATININE 0.90 08-Nov-2023   CALCIUM 8.7 (L) 11/08/23   GFRNONAA >60 Nov 08, 2023   GFRAA >60 10/26/2018   Lipid Panel:  Lab Results  Component Value Date   LDLCALC 117 (H) 09/30/2018   HgbA1c:  Lab Results  Component Value Date   HGBA1C 6.0 (H) 09/30/2018   Urine Drug Screen:     Component Value Date/Time   LABOPIA NONE DETECTED 11-08-2023 2100   COCAINSCRNUR NONE DETECTED 11-08-2023 2100   LABBENZ NONE DETECTED 2023-11-08 2100   AMPHETMU NONE DETECTED November 08, 2023 2100   THCU POSITIVE (A) 2023/11/08 2100   LABBARB NONE DETECTED 2023-11-08 2100    Alcohol Level     Component Value Date/Time   ETH <10 11/08/2023 1254   INR  Lab Results  Component Value Date   INR 1.0 04/28/2023   APTT  Lab Results  Component Value Date   APTT 27 04/28/2023   AED levels: No results found for: "PHENYTOIN", "ZONISAMIDE", "LAMOTRIGINE", "LEVETIRACETA"  CT Head without contrast(Personally reviewed): CTH was negative for a large hypodensity concerning for a large territory infarct or  hyperdensity concerning for an ICH  CT angio Head and Neck with contrast(Personally reviewed): Pending  MRI Brain(Personally reviewed): Marland Kitchen Few scattered subcentimeter acute to early subacute ischemic infarcts involving the right frontal and parietal lobes as well as the left posterior corpus callosum. No associated hemorrhage or mass effect. 2. Underlying age-related cerebral atrophy with advanced chronic microvascular ischemic disease, with multiple remote lacunar infarcts involving the hemispheric cerebral white matter, deep gray nuclei, pons, and cerebellum.  ASSESSMENT   Christina [name] is a 53 y.o. 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  She was found to have scattered small acute/subacute strokes involving R frontal, R parietal lobe, L posterior corpus callosum.  RECOMMENDATIONS  - Frequent Neuro checks per stroke unit protocol - Recommend Vascular imaging with CTA head and neck - Recommend obtaining TTE - Recommend obtaining Lipid panel with LDL - Please start statin if LDL > 70 - Recommend HbA1c to evaluate for diabetes and how well it is controlled. - Antithrombotic - Aspirin 81mg  daily along with plavix 75mg  daily x 21 days, followed by Aspirin 81mg  daily alone. - Recommend DVT ppx - SBP goal - permissive hypertension first 24 h < 220/110. Held home meds.  - Recommend Telemetry monitoring for arrythmia - Recommend bedside swallow screen prior to PO intake. - Stroke education booklet - Recommend PT/OT/SLP consult - counseled her on the importance of medication compliance. - urine drug screen. ______________________________________________________________________    Welton Flakes, MD [facility]

## 2023-10-20 ENCOUNTER — Observation Stay (HOSPITAL_BASED_OUTPATIENT_CLINIC_OR_DEPARTMENT_OTHER): Payer: MEDICAID

## 2023-10-20 ENCOUNTER — Observation Stay (HOSPITAL_COMMUNITY): Payer: MEDICAID

## 2023-10-20 DIAGNOSIS — I10 Essential (primary) hypertension: Secondary | ICD-10-CM | POA: Diagnosis not present

## 2023-10-20 DIAGNOSIS — Z21 Asymptomatic human immunodeficiency virus [HIV] infection status: Secondary | ICD-10-CM | POA: Diagnosis not present

## 2023-10-20 DIAGNOSIS — I639 Cerebral infarction, unspecified: Secondary | ICD-10-CM | POA: Diagnosis not present

## 2023-10-20 DIAGNOSIS — R9431 Abnormal electrocardiogram [ECG] [EKG]: Secondary | ICD-10-CM | POA: Diagnosis present

## 2023-10-20 DIAGNOSIS — I6389 Other cerebral infarction: Secondary | ICD-10-CM | POA: Diagnosis not present

## 2023-10-20 DIAGNOSIS — F418 Other specified anxiety disorders: Secondary | ICD-10-CM | POA: Diagnosis not present

## 2023-10-20 LAB — ECHOCARDIOGRAM COMPLETE
AR max vel: 2.01 cm2
AV Area VTI: 2.3 cm2
AV Area mean vel: 2.06 cm2
AV Mean grad: 1 mm[Hg]
AV Peak grad: 2.5 mm[Hg]
Ao pk vel: 0.78 m/s
Area-P 1/2: 3.53 cm2
Height: 65 in
S' Lateral: 2.2 cm
Weight: 1904.77 [oz_av]

## 2023-10-20 LAB — CBC
HCT: 42 % (ref 36.0–46.0)
Hemoglobin: 13.6 g/dL (ref 12.0–15.0)
MCH: 27.8 pg (ref 26.0–34.0)
MCHC: 32.4 g/dL (ref 30.0–36.0)
MCV: 85.7 fL (ref 80.0–100.0)
Platelets: 125 10*3/uL — ABNORMAL LOW (ref 150–400)
RBC: 4.9 MIL/uL (ref 3.87–5.11)
RDW: 17.2 % — ABNORMAL HIGH (ref 11.5–15.5)
WBC: 2.7 10*3/uL — ABNORMAL LOW (ref 4.0–10.5)
nRBC: 0 % (ref 0.0–0.2)

## 2023-10-20 LAB — BASIC METABOLIC PANEL
Anion gap: 13 (ref 5–15)
BUN: 9 mg/dL (ref 6–20)
CO2: 25 mmol/L (ref 22–32)
Calcium: 8.3 mg/dL — ABNORMAL LOW (ref 8.9–10.3)
Chloride: 105 mmol/L (ref 98–111)
Creatinine, Ser: 0.94 mg/dL (ref 0.44–1.00)
GFR, Estimated: >60 mL/min (ref >60)
Glucose, Bld: 92 mg/dL (ref 70–99)
Potassium: 3.5 mmol/L (ref 3.5–5.1)
Sodium: 143 mmol/L (ref 135–145)

## 2023-10-20 LAB — LIPID PANEL
Cholesterol: 175 mg/dL (ref 0–200)
HDL: 47 mg/dL (ref >40)
LDL Cholesterol: 102 mg/dL — ABNORMAL HIGH (ref 0–99)
Total CHOL/HDL Ratio: 3.7 {ratio}
Triglycerides: 129 mg/dL (ref <150)
VLDL: 26 mg/dL (ref 0–40)

## 2023-10-20 LAB — ANTITHROMBIN III: AntiThromb III Func: 108 % (ref 75–120)

## 2023-10-20 LAB — MAGNESIUM: Magnesium: 2.1 mg/dL (ref 1.7–2.4)

## 2023-10-20 LAB — D-DIMER, QUANTITATIVE: D-Dimer, Quant: 1.14 ug{FEU}/mL — ABNORMAL HIGH (ref 0.00–0.50)

## 2023-10-20 LAB — SEDIMENTATION RATE: Sed Rate: 29 mm/h — ABNORMAL HIGH (ref 0–22)

## 2023-10-20 MED ORDER — GUAIFENESIN ER 600 MG PO TB12
1200.0000 mg | ORAL_TABLET | Freq: Two times a day (BID) | ORAL | Status: DC
  Administered 2023-10-20 – 2023-10-24 (×8): 1200 mg via ORAL
  Filled 2023-10-20 (×8): qty 2

## 2023-10-20 MED ORDER — ACETAMINOPHEN 650 MG RE SUPP: 650.0000 mg | RECTAL | Status: DC | PRN

## 2023-10-20 MED ORDER — ACETAMINOPHEN 325 MG PO TABS
650.0000 mg | ORAL_TABLET | ORAL | Status: DC | PRN
  Administered 2023-10-24: 650 mg via ORAL
  Filled 2023-10-20: qty 2

## 2023-10-20 MED ORDER — BICTEGRAVIR-EMTRICITAB-TENOFOV 50-200-25 MG PO TABS
1.0000 | ORAL_TABLET | Freq: Every day | ORAL | Status: DC
  Administered 2023-10-20 – 2023-10-24 (×5): 1 via ORAL
  Filled 2023-10-20 (×6): qty 1

## 2023-10-20 MED ORDER — MELATONIN 3 MG PO TABS
3.0000 mg | ORAL_TABLET | Freq: Every evening | ORAL | Status: DC | PRN
  Administered 2023-10-20 – 2023-10-23 (×4): 3 mg via ORAL
  Filled 2023-10-20 (×4): qty 1

## 2023-10-20 MED ORDER — ACETAMINOPHEN 160 MG/5ML PO SOLN: 650.0000 mg | ORAL | Status: DC | PRN

## 2023-10-20 MED ORDER — ENOXAPARIN SODIUM 40 MG/0.4ML IJ SOSY
40.0000 mg | PREFILLED_SYRINGE | INTRAMUSCULAR | Status: DC
  Administered 2023-10-20 – 2023-10-24 (×5): 40 mg via SUBCUTANEOUS
  Filled 2023-10-20 (×5): qty 0.4

## 2023-10-20 MED ORDER — IOHEXOL 350 MG/ML SOLN
75.0000 mL | Freq: Once | INTRAVENOUS | Status: AC | PRN
  Administered 2023-10-20: 75 mL via INTRAVENOUS

## 2023-10-20 MED ORDER — SENNOSIDES-DOCUSATE SODIUM 8.6-50 MG PO TABS: 1.0000 | ORAL_TABLET | Freq: Every evening | ORAL | Status: DC | PRN

## 2023-10-20 MED ORDER — LABETALOL HCL 5 MG/ML IV SOLN: 10.0000 mg | INTRAVENOUS | Status: DC | PRN

## 2023-10-20 MED ORDER — POTASSIUM CHLORIDE 20 MEQ PO PACK
20.0000 meq | PACK | Freq: Once | ORAL | Status: AC
  Administered 2023-10-20: 20 meq via ORAL
  Filled 2023-10-20: qty 1

## 2023-10-20 MED ORDER — OXYCODONE HCL 5 MG PO TABS
5.0000 mg | ORAL_TABLET | ORAL | Status: DC | PRN
  Administered 2023-10-20 – 2023-10-24 (×10): 5 mg via ORAL
  Filled 2023-10-20 (×10): qty 1

## 2023-10-20 MED ORDER — SULFAMETHOXAZOLE-TRIMETHOPRIM 800-160 MG PO TABS: 1.0000 | ORAL_TABLET | ORAL | Status: DC

## 2023-10-20 MED ORDER — ATORVASTATIN CALCIUM 40 MG PO TABS
40.0000 mg | ORAL_TABLET | Freq: Every day | ORAL | Status: DC
  Administered 2023-10-21 – 2023-10-24 (×4): 40 mg via ORAL
  Filled 2023-10-20 (×3): qty 1
  Filled 2023-10-20: qty 4

## 2023-10-20 MED ORDER — STROKE: EARLY STAGES OF RECOVERY BOOK
Freq: Once | Status: AC
  Filled 2023-10-20: qty 1

## 2023-10-20 NOTE — Progress Notes (Signed)
 PT Cancellation Note  Patient Details Name: Christina [name] MRN: [id number] DOB: July 31, 1971   Cancelled Treatment:    Reason Eval/Treat Not Completed: Patient at procedure or test/unavailable   Mackenze Grandison B Jariyah Hackley 10/20/2023, 9:16 AM Merryl Hacker, PT Acute Rehabilitation Services Office: [phone]

## 2023-10-20 NOTE — Discharge Instructions (Signed)
 Dear Christina [name],   Congratulations for your interest in quitting smoking!  Find a program that suits you best: when you want to quit, how you need support, where you live, and how you like to learn.    If you're ready to get started TODAY, consider scheduling a visit through [facility] [email or web address].  Appointments are available from 8am to 8pm, Monday to Friday.   Most health insurance plans will cover some level of tobacco cessation visits and medications.    Additional Resources: [employer] are also available to help you quit & provide the support you'll need. Many programs are available in both [place] and Spanish and have a long history of successfully helping people get off and stay off tobacco.    Quit Smoking Apps:  quitSTART at [employer].de QuitGuide?at ForgetParking.dk Online education and resources: Smokefree  at [employer].gov Free Telephone Coaching: QuitNow,  Call 1-800-QUIT-NOW (253-696-1750) or Text- Ready to 947-217-2984 *Quitline [place] has teamed up with Medicaid to offer a free 14 week program    Vaping- Want to Quit? Free 24/7 support. Call [facility]  [place], [place], [place], [place], [place]  [facility]

## 2023-10-20 NOTE — Progress Notes (Addendum)
 PROGRESS NOTE    Christina [name]  ZOX:[id number] DOB: Sep 02, 1970 DOA: 10/19/2023 PCP: Pcp, No   Brief Narrative:  The patient is a 53 year old African-American female with a past medical history significant for Mann[place] to HIV, depression, anxiety, history of DVT, substance abuse and other comorbidities who presented with right-sided weakness.  She also reported 3 to 4 weeks of right facial, right arm, right leg weakness and because her symptoms are not improving and had difficulty ambulating she presented to the ED.  Further workup reveals an acute CVA with likely embolic in nature however workup was been negative.  Neurology recommending dual antiplatelet therapy for 3 weeks now and then just aspirin monotherapy.  PT OT recommending SNF and she is completed her stroke workup but neurology recommending contacting them if she has any episodes of A-fib during the hospitalization.  Hypercoagulable panel has been obtained and can be followed-up in outpatient setting as she is Medically Stable to D/C to SNF pending CTA findings  Assessment and Plan:  Acute Ischemic CVA  -CT Head Negative and CTA Head and Neck showed: no LVO or stenosis. -MRI Brain w/o Contrast: Few subcentimeter acute to subacute ischemic infarct involving the right frontal and parietal lobes as well as the left posterior corpus callosum.  -C/w Tele monitoring to evaluate for A Fib and neuro checks,  -ECHO:  LVEF of 60-65 with no RWMA and G1DD; Also w findings of  RV pressure/volume overlaod and moderately reduced RVSF with small pericardial effusion -LE Duplex showed no DVT b/l but ruling out PE as below;  -Lipid Panel as below. HbA1c pending;  -Started ASA 81 mg and Plavix 75 mg daily for 3 weeks and then just ASA monotherapy, Permissive HTN, Therapies recommending SNF -Hypercoag Panel (pending) as CVA could be embolic.  -Neuro recommending following up at the [facility] clinic in 6 to 8 weeks -Appears medical clear for D/C pending CTA PE  Protocol   HIV/AIDS:  Diagnosed during hospitalization in September 2024 and started on Biktarvy and Bactrim but has not taken since discharge, CD4 was <35 and VL 162k in September 2024. Now ESR is 29. She is agreeable to resuming treatment with Biktarvy and Bactrim, encouraged to follow-up with ID on discharge in the Interim we will repeat a CD4 count and VL while hospitalized  Cough and Congestion Reduced RVSF r/o PE: CXR Neg; SARS-CoV-2, Influenza A/B and RSV Panel pending. Add Guaifenesin 1200 mg po BID, Flutter and Flutter Valve. F/U D-Dimer and CTA chest PE   Essential Hypertension: Allow for Permissive HTN to 220/110 for now and holding her Home Amlodipine 5 mg po Daily. CTM BP per Protocol. Last BP reading was 130/96  HLD: Lipid Panel done and showed a total Cholesterol/HDL ratio 3.7, Cholesterol 175, HDL 47, LDL 102, TG of 129, VLDL 26. C/w Atrovastatin 40 mg po Daily   Depression and Anxiety: Encouraged to reestablish with [facility]; No longer taking Escitalopram 10 mg po Daily per [facility] and will not resume currently given prolonged qTC   Prolonged QT interval:  Supplement potassium, check magnesium level, avoid QT-prolonging medications   Leukopenia: In the setting of HIV. WBC went from 2.9 -> 2.7. CTM for S/Sx of Infection and Repeat CBC in  the AM  Thrombocytopenia: Plt Count dropped and went from 160 -> 125. CTM for S/Sx of Bleeding; No overt bleeding noted. Repeat CBC in the AM.   THC Abuse: Tested + for THC on UDS. Counseling given  Hypoalbuminemia: Patient's Albumin was 3.2.  CTM and Trend and repeat CMP in the AM   DVT prophylaxis: enoxaparin (LOVENOX) injection 40 mg Start: 10/20/23 1000    Code Status: Full Code Family Communication: No family present at bedside  Disposition Plan:  Level of care: Telemetry Medical Status is: Observation The patient remains OBS appropriate and will d/c before 2 midnights.   Consultants:  Neurology  Procedures:  As  delineated as above  Antimicrobials:  Anti-infectives (From admission, onward)    Start     Dose/Rate Route Frequency Ordered Stop   10/21/23 0900  sulfamethoxazole-trimethoprim (BACTRIM DS) 800-160 MG per tablet 1 tablet  Status:  Discontinued        1 tablet Oral Once per day on Monday Wednesday Friday 10/20/23 0158 10/20/23 0415   10/20/23 1000  bictegravir-emtricitabine-tenofovir AF (BIKTARVY) 50-200-25 MG per tablet 1 tablet        1 tablet Oral Daily 10/20/23 0158         Subjective: Seen and examined at bedside and continues to feel weak and she has some slurred speech.  Lives in a hotel due to lack of resources and does not take medications given affordability.  Coughing but not able to cough up some sputum.  No nausea or vomiting.  Denies any other concerns or complaints this time.  Objective: Vitals:   10/20/23 1325 10/20/23 1400 10/20/23 1645 10/20/23 1733  BP: (!) 140/110 (!) 130/96 (!) 132/101 (!) 140/108  Pulse: 73 74  83  Resp: 13 16 15 18   Temp:  98 F (36.7 C)  98 F (36.7 C)  TempSrc:    Oral  SpO2: 100% 100%  100%  Weight:      Height:       No intake or output data in the 24 hours ending 10/20/23 1926 Filed Weights   10/19/23 1241  Weight: 54 kg   Examination: Physical Exam:  Constitutional: Thin American female in no acute distress Respiratory: Diminished to auscultation bilaterally some coarse breath sounds, no wheezing, rales, rhonchi or crackles. Normal respiratory effort and patient is not tachypenic. No accessory muscle use.  Unlabored breathing Cardiovascular: RRR, no murmurs / rubs / gallops. S1 and S2 auscultated. No extremity edema.  Abdomen: Soft, non-tender, non-distended. Bowel sounds positive.  GU: Deferred. Musculoskeletal: No clubbing / cyanosis of digits/nails. No joint deformity upper and lower extremities.  Skin: No rashes, lesions, ulcers on limited skin evaluation. No induration; Warm and dry.  Neurologic: Has some right-sided  facial droop and slurred speech and some dysarthria Psychiatric: She is awake and alert and oriented  Data Reviewed: I have personally reviewed following labs and imaging studies  CBC: Recent Labs  Lab 10/19/23 1254 10/20/23 0456  WBC 2.9* 2.7*  NEUTROABS 1.4*  --   HGB 14.0 13.6  HCT 43.5 42.0  MCV 86.3 85.7  PLT 160 125*   Basic Metabolic Panel: Recent Labs  Lab 10/19/23 1254 10/20/23 0456  NA 142 143  K 3.6 3.5  CL 107 105  CO2 22 25  GLUCOSE 89 92  BUN 5* 9  CREATININE 0.90 0.94  CALCIUM 8.7* 8.3*  MG  --  2.1   GFR: Estimated Creatinine Clearance: 59 mL/min (by C-G formula based on SCr of 0.94 mg/dL). Liver Function Tests: Recent Labs  Lab 10/19/23 1254  AST 24  ALT 16  ALKPHOS 87  BILITOT 0.6  PROT 7.8  ALBUMIN 3.2*   No results for input(s): "LIPASE", "AMYLASE" in the last 168 hours. No results for input(s): "AMMONIA"  in the last 168 hours. Coagulation Profile: No results for input(s): "INR", "PROTIME" in the last 168 hours. Cardiac Enzymes: No results for input(s): "CKTOTAL", "CKMB", "CKMBINDEX", "TROPONINI" in the last 168 hours. BNP (last 3 results) No results for input(s): "PROBNP" in the last 8760 hours. HbA1C: No results for input(s): "HGBA1C" in the last 72 hours. CBG: No results for input(s): "GLUCAP" in the last 168 hours. Lipid Profile: Recent Labs    10/20/23 0456  CHOL 175  HDL 47  LDLCALC 102*  TRIG 129  CHOLHDL 3.7   Thyroid Function Tests: No results for input(s): "TSH", "T4TOTAL", "FREET4", "T3FREE", "THYROIDAB" in the last 72 hours. Anemia Panel: No results for input(s): "VITAMINB12", "FOLATE", "FERRITIN", "TIBC", "IRON", "RETICCTPCT" in the last 72 hours. Sepsis Labs: No results for input(s): "PROCALCITON", "LATICACIDVEN" in the last 168 hours.  No results found for this or any previous visit (from the past 240 hours).   Radiology Studies: DG CHEST PORT 1 VIEW Result Date: 10/20/2023 CLINICAL DATA:  Cough.  Stroke  4 days ago. EXAM: PORTABLE CHEST 1 VIEW COMPARISON:  04/28/2023 FINDINGS: Shallow inspiration. Heart size and pulmonary vascularity are normal for technique. Lungs are clear. No pleural effusion or pneumothorax. Mediastinal contours appear intact. Old fracture deformity of the right clavicle. IMPRESSION: No active disease. Electronically Signed   By: Burman Nieves M.D.   On: 10/20/2023 17:09   VAS [place] LOWER EXTREMITY VENOUS (DVT) Result Date: 10/20/2023  Lower Venous DVT Study Patient Name:  JAELIANA LOCOCO  Date of Exam:   10/20/2023 Medical Rec #: [id number]       Accession #:    [id number] Date of Birth: April 28, 1971      Patient Gender: F Patient Age:   68 years Exam Location:  [facility] Procedure:      VAS [place] LOWER EXTREMITY VENOUS (DVT) Referring Phys: Leticia Penna --------------------------------------------------------------------------------  Indications: Stroke. 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 Comparison Study: 10/01/18 - Negative Performing Technologist: [place] Sink Sturdivant-Jones RDMS, RVT  Examination Guidelines: A complete evaluation includes B-mode imaging, spectral Doppler, color Doppler, and power Doppler as needed of all accessible portions of each vessel. Bilateral testing is considered an integral part of a complete examination. Limited examinations for reoccurring indications may be performed as noted. The reflux portion of the exam is performed with the patient in reverse Trendelenburg.  +---------+---------------+---------+-----------+----------+--------------+ RIGHT    CompressibilityPhasicitySpontaneityPropertiesThrombus Aging +---------+---------------+---------+-----------+----------+--------------+ CFV      Full           Yes      Yes                                  +---------+---------------+---------+-----------+----------+--------------+ SFJ      Full                                                        +---------+---------------+---------+-----------+----------+--------------+ FV Prox  Full                                                        +---------+---------------+---------+-----------+----------+--------------+  FV Mid   Full                                                        +---------+---------------+---------+-----------+----------+--------------+ FV DistalFull                                                        +---------+---------------+---------+-----------+----------+--------------+ PFV      Full                                                        +---------+---------------+---------+-----------+----------+--------------+ POP      Full           Yes      Yes                                 +---------+---------------+---------+-----------+----------+--------------+ PTV      Full                                                        +---------+---------------+---------+-----------+----------+--------------+ PERO     Full                                                        +---------+---------------+---------+-----------+----------+--------------+   +---------+---------------+---------+-----------+----------+--------------+ LEFT     CompressibilityPhasicitySpontaneityPropertiesThrombus Aging +---------+---------------+---------+-----------+----------+--------------+ CFV      Full           Yes      Yes                                 +---------+---------------+---------+-----------+----------+--------------+ SFJ      Full                                                        +---------+---------------+---------+-----------+----------+--------------+ FV Prox  Full                                                         +---------+---------------+---------+-----------+----------+--------------+ FV Mid   Full                                                        +---------+---------------+---------+-----------+----------+--------------+  FV DistalFull                                                        +---------+---------------+---------+-----------+----------+--------------+ PFV      Full                                                        +---------+---------------+---------+-----------+----------+--------------+ POP      Full           Yes      Yes                                 +---------+---------------+---------+-----------+----------+--------------+ PTV      Full                                                        +---------+---------------+---------+-----------+----------+--------------+ PERO     Full                                                        +---------+---------------+---------+-----------+----------+--------------+     Summary: BILATERAL: - No evidence of deep vein thrombosis seen in the lower extremities, bilaterally. -No evidence of popliteal cyst, bilaterally.   *See table(s) above for measurements and observations. Electronically signed by Lemar Livings MD on 10/20/2023 at 3:27:20 PM.    Final    ECHOCARDIOGRAM COMPLETE Result Date: 10/20/2023    ECHOCARDIOGRAM REPORT   Patient Name:   LESLEA VOWLES Date of Exam: 10/20/2023 Medical Rec #:  [id number]      Height:       65.0 in Accession #:    [id number]     Weight:       119.0 lb Date of Birth:  Jan 19, 1971     BSA:          1.587 m Patient Age:    52 years       BP:           123/86 mmHg Patient Gender: F              HR:           77 bpm. Exam Location:  Inpatient Procedure: 2D Echo (Both Spectral and Color Flow Doppler were utilized during            procedure). Indications:    Stroke  History:        Patient has no prior history of Echocardiogram examinations. HIV                 HlO DVT Substance  abuse; Risk Factors:Hypertension and Current                 Smoker.  Sonographer:    Dondra Prader RVT RCS Referring Phys: [id number] TIMOTHY S OPYD IMPRESSIONS  1. Left ventricular ejection fraction, by estimation, is 60 to 65%. The left ventricle has normal function. The left ventricle has no regional wall motion abnormalities. Left ventricular diastolic parameters are consistent with Grade I diastolic dysfunction (impaired relaxation).  2. D-shaped interventricular septum suggestive of RV pressure/volume overload. Right ventricular systolic function is moderately reduced. The right ventricular size is moderately enlarged. There is severely elevated pulmonary artery systolic pressure. The estimated right ventricular systolic pressure is 77.1 mmHg.  3. Right atrial size was moderately dilated.  4. The mitral valve is normal in structure. No evidence of mitral valve regurgitation. No evidence of mitral stenosis.  5. Tricuspid valve regurgitation is moderate.  6. The aortic valve is tricuspid. Aortic valve regurgitation is not visualized. No aortic stenosis is present.  7. The inferior vena cava is dilated in size with <50% respiratory variability, suggesting right atrial pressure of 15 mmHg.  8. A small pericardial effusion is present. FINDINGS  Left Ventricle: Left ventricular ejection fraction, by estimation, is 60 to 65%. The left ventricle has normal function. The left ventricle has no regional wall motion abnormalities. Strain imaging was not performed. The left ventricular internal cavity  size was normal in size. There is no left ventricular hypertrophy. Left ventricular diastolic parameters are consistent with Grade I diastolic dysfunction (impaired relaxation). Right Ventricle: D-shaped interventricular septum suggestive of RV pressure/volume overload. The right ventricular size is moderately enlarged. No increase in right ventricular wall thickness. Right ventricular systolic function is moderately reduced.  There is severely elevated pulmonary artery systolic pressure. The tricuspid regurgitant velocity is 3.94 m/s, and with an assumed right atrial pressure of 15 mmHg, the estimated right ventricular systolic pressure is 77.1 mmHg. Left Atrium: Left atrial size was normal in size. Right Atrium: Right atrial size was moderately dilated. Pericardium: A small pericardial effusion is present. Mitral Valve: The mitral valve is normal in structure. No evidence of mitral valve regurgitation. No evidence of mitral valve stenosis. Tricuspid Valve: The tricuspid valve is normal in structure. Tricuspid valve regurgitation is moderate. Aortic Valve: The aortic valve is tricuspid. Aortic valve regurgitation is not visualized. No aortic stenosis is present. Aortic valve mean gradient measures 1.0 mmHg. Aortic valve peak gradient measures 2.5 mmHg. Aortic valve area, by VTI measures 2.30 cm. Pulmonic Valve: The pulmonic valve was normal in structure. Pulmonic valve regurgitation is mild. Aorta: The aortic root is normal in size and structure. Venous: The inferior vena cava is dilated in size with less than 50% respiratory variability, suggesting right atrial pressure of 15 mmHg. IAS/Shunts: No atrial level shunt detected by color flow Doppler. Additional Comments: 3D imaging was not performed.  LEFT VENTRICLE PLAX 2D LVIDd:         3.10 cm   Diastology LVIDs:         2.20 cm   LV e' medial:    5.77 cm/s LV PW:         1.20 cm   LV E/e' medial:  6.8 LV IVS:        1.10 cm   LV e' lateral:   9.28 cm/s LVOT diam:     1.60 cm   LV E/e' lateral: 4.2 LV SV:         31 LV SV Index:   19 LVOT Area:     2.01 cm  RIGHT VENTRICLE            IVC RV Basal diam:  4.50 cm    IVC diam: 2.40 cm  RV Mid diam:    4.00 cm RV S prime:     8.31 cm/s TAPSE (M-mode): 1.3 cm LEFT ATRIUM             Index        RIGHT ATRIUM           Index LA Vol (A2C):   20.7 ml 13.05 ml/m  RA Area:     18.50 cm LA Vol (A4C):   27.1 ml 17.08 ml/m  RA Volume:   59.10 ml   37.25 ml/m LA Biplane Vol: 24.4 ml 15.38 ml/m  AORTIC VALVE                    PULMONIC VALVE AV Area (Vmax):    2.01 cm     PV Vmax:          0.41 m/s AV Area (Vmean):   2.06 cm     PV Peak grad:     0.7 mmHg AV Area (VTI):     2.30 cm     PR End Diast Vel: 10.37 msec AV Vmax:           78.40 cm/s AV Vmean:          49.900 cm/s AV VTI:            0.133 m AV Peak Grad:      2.5 mmHg AV Mean Grad:      1.0 mmHg LVOT Vmax:         78.50 cm/s LVOT Vmean:        51.200 cm/s LVOT VTI:          0.152 m LVOT/AV VTI ratio: 1.14  AORTA Ao Root diam: 3.30 cm Ao Asc diam:  3.00 cm MITRAL VALVE               TRICUSPID VALVE MV Area (PHT): 3.53 cm    TR Peak grad:   62.1 mmHg MV Decel Time: 215 msec    TR Vmax:        394.00 cm/s MV E velocity: 39.10 cm/s MV A velocity: 53.90 cm/s  SHUNTS MV E/A ratio:  0.73        Systemic VTI:  0.15 m                            Systemic Diam: 1.60 cm Dalton McleanMD Electronically signed by Wilfred Lacy Signature Date/Time: 10/20/2023/9:58:17 AM    Final    CT ANGIO HEAD NECK W WO CM Result Date: 10/20/2023 CLINICAL DATA:  Stroke, determine embolic source EXAM: CT ANGIOGRAPHY HEAD AND NECK WITH AND WITHOUT CONTRAST TECHNIQUE: Multidetector CT imaging of the head and neck was performed using the standard protocol during bolus administration of intravenous contrast. Multiplanar CT image reconstructions and MIPs were obtained to evaluate the vascular anatomy. Carotid stenosis measurements (when applicable) are obtained utilizing NASCET criteria, using the distal internal carotid diameter as the denominator. RADIATION DOSE REDUCTION: This exam was performed according to the departmental dose-optimization program which includes automated exposure control, adjustment of the mA and/or kV according to patient size and/or use of iterative reconstruction technique. CONTRAST:  75mL OMNIPAQUE IOHEXOL 350 MG/ML SOLN COMPARISON:  Brain MRI from yesterday FINDINGS: CT HEAD FINDINGS Brain: Chronic  small vessel infarcts underestimated relative to prior brain MRI, seen in the deep white matter and brainstem. Recent infarcts are occult when compared to MRI. No hemorrhage, hydrocephalus, or shift. Vascular:  See below Skull: Normal. Negative for fracture or focal lesion. Sinuses/Orbits: No acute finding. Review of the MIP images confirms the above findings CTA NECK FINDINGS Aortic arch: Aberrant right subclavian artery. Right carotid system: Major vessels are smoothly contoured and widely patent without atheromatous change. Left carotid system: Major vessels are smoothly contoured and widely patent without atheromatous change. Vertebral arteries: Major vessels are smoothly contoured and widely patent without atheromatous change. Skeleton: Generalized degeneration of the cervical spine. No acute or aggressive finding Other neck: Generous sized lymph nodes throughout the neck with homogeneous appearance. These may relate to patient's history of HIV. Upper chest: Dilated main pulmonary artery at 3.4 cm, usually indicating pulmonary Review of the MIP images confirms the above findings CTA HEAD FINDINGS Anterior circulation: No significant stenosis, proximal occlusion, aneurysm, or vascular malformation. Posterior circulation: Comparatively Fainter density of the right vertebral artery which may relate to relatively delayed filling from aberrant right subclavian artery with circuitous course. No branch occlusion, beading, or aneurysm. Venous sinuses: Not opacified on this arterial study. Anatomic variants: Hypoplastic right P1 segment. Review of the MIP images confirms the above findings IMPRESSION: 1. No emergent arterial finding. No major vessel irregularity or flow reducing stenosis. 2. Mildly enlarged lymph nodes throughout the neck, possibly related HIV status. In the absence of priors, recommend clinical or imaging follow-up for stability. Electronically Signed   By: Tiburcio Pea M.D.   On: 10/20/2023 05:39    MR Brain Wo Contrast (neuro protocol) Result Date: 10/19/2023 CLINICAL DATA:  Initial evaluation for acute neuro deficit, stroke suspected. EXAM: MRI HEAD WITHOUT CONTRAST TECHNIQUE: Multiplanar, multiecho pulse sequences of the brain and surrounding structures were obtained without intravenous contrast. COMPARISON:  Prior CT from earlier the same day. FINDINGS: Brain: Mild age-related cerebral atrophy. Patchy and confluent T2/FLAIR hyperintensity involving the periventricular and deep white matter both cerebral hemispheres as well as the pons, consistent with chronic small vessel ischemic disease, moderate to advanced in nature. Multiple superimposed remote lacunar infarcts present about the hemispheric cerebral white matter, deep gray nuclei, pons, and cerebellum. Few scattered subcentimeter foci of restricted diffusion are seen involving the right frontal and parietal lobes (series 5, images 66-63). Additional subcentimeter focus of diffusion signal abnormality noted in the region of the posterior left corpus callosum (series 5, image 62). Findings consistent with small evolving acute to early subacute ischemic infarcts. No associated hemorrhage or mass effect. No acute intracranial hemorrhage elsewhere. Few scattered chronic micro hemorrhages noted about the pons and left cerebral hemisphere, likely hypertensive in nature. No mass lesion, midline shift or mass effect. No hydrocephalus or extra-axial fluid collection. Pituitary gland and suprasellar region within normal limits. Vascular: Major intracranial vascular flow voids are maintained. Skull and upper cervical spine: Craniocervical junction within normal limits. Decreased T1 signal intensity noted within the visualized bone marrow, nonspecific, but most commonly related to anemia, smoking or obesity. No scalp soft tissue abnormality. Sinuses/Orbits: Globes and orbital soft tissues within normal limits. Mild mucosal thickening noted about the ethmoidal  air cells and maxillary sinuses. No significant mastoid effusion. Other: None. IMPRESSION: 1. Few scattered subcentimeter acute to early subacute ischemic infarcts involving the right frontal and parietal lobes as well as the left posterior corpus callosum. No associated hemorrhage or mass effect. 2. Underlying age-related cerebral atrophy with advanced chronic microvascular ischemic disease, with multiple remote lacunar infarcts involving the hemispheric cerebral white matter, deep gray nuclei, pons, and cerebellum. Electronically Signed   By: Rise Mu M.D.   On:  10/19/2023 20:51   CT Head Wo Contrast Result Date: 10/19/2023 CLINICAL DATA:  Neuro deficit, acute, stroke suspected. EXAM: CT HEAD WITHOUT CONTRAST TECHNIQUE: Contiguous axial images were obtained from the base of the skull through the vertex without intravenous contrast. RADIATION DOSE REDUCTION: This exam was performed according to the departmental dose-optimization program which includes automated exposure control, adjustment of the mA and/or kV according to patient size and/or use of iterative reconstruction technique. COMPARISON:  Head CT 09/12/2023 FINDINGS: Brain: There is no evidence of an acute infarct, intracranial hemorrhage, mass, midline shift, or extra-axial fluid collection. Patchy hypodensities in the cerebral white matter bilaterally are similar to the prior CT and nonspecific but compatible with age advanced chronic small vessel ischemic disease with multiple chronic lacunar infarcts. There is unchanged mild cerebral atrophy. Vascular: No hyperdense vessel. Skull: No acute fracture or suspicious lesion. Sinuses/Orbits: Visualized paranasal sinuses and mastoid air cells are clear. Unremarkable orbits. Other: None. IMPRESSION: 1. No evidence of acute intracranial abnormality. 2. Age advanced chronic small vessel ischemic disease. Electronically Signed   By: Sebastian Ache M.D.   On: 10/19/2023 14:57   Scheduled Meds:   [START ON 10/21/2023]  stroke: early stages of recovery book   Does not apply Once   aspirin EC  81 mg Oral Daily   atorvastatin  40 mg Oral Daily   bictegravir-emtricitabine-tenofovir AF  1 tablet Oral Daily   clopidogrel  75 mg Oral Daily   enoxaparin (LOVENOX) injection  40 mg Subcutaneous Q24H   guaiFENesin  1,200 mg Oral BID   Continuous Infusions:   LOS: 0 days   Marguerita Merles, DO [facility] Available via Epic secure chat 7am-7pm After these hours, please refer to coverage provider listed on [email or web address] 10/20/2023, 7:26 PM

## 2023-10-20 NOTE — Evaluation (Signed)
 Occupational Therapy Evaluation Patient Details Name: Christina [name] MRN: [id number] DOB: Oct 15, 1970 Today's Date: 10/20/2023   History of Present Illness   53 y.o. female admitted 2/24 with Rt sided weakness for 3-4 weeks. MRI brain: scattered subcentimeter acute to early subacute ischemic infarctions involving Rt frontal, Rt parietal lobe, Lt posterior corpus callosum. PMhx: HIV, depression, anxiety, DVT, and substance abuse     Clinical Impressions Patient admitted for the diagnosis above.  Prior to admission, patient states she is homeless, with no where to live.  Unclear ho reliable of a historian she is.  Per prior notes/past admits, patient was living with her Aunt, walking with a 2WRW and could participate with her ADL care.  Currently she is needing up to Min A for short bouts of mobility at [facility] level, R knee buckle noted.  She is also needing up to Mod A for lower body ADL from a sit to stand level.  Patient presents with incoordination to her upper extremities, poor dynamic balance, fair activity tolerance and is currently dragging her R foot.  OT will follow in the acute setting to address deficits, and Patient will benefit from continued inpatient follow up therapy, <3 hours/day prior to returning home.       If plan is discharge home, recommend the following:   Assist for transportation;Assistance with cooking/housework;A lot of help with bathing/dressing/bathroom;A lot of help with walking and/or transfers     Functional Status Assessment   Patient has had a recent decline in their functional status and demonstrates the ability to make significant improvements in function in a reasonable and predictable amount of time.     Equipment Recommendations   BSC/3in1;Tub/shower seat     Recommendations for Other Services         Precautions/Restrictions   Precautions Precautions: Fall Recall of Precautions/Restrictions: Intact Precaution/Restrictions Comments:  Watch BP Restrictions Weight Bearing Restrictions Per Provider Order: No     Mobility Bed Mobility   Bed Mobility: Supine to Sit, Sit to Supine     Supine to sit: Min assist Sit to supine: Contact guard assist        Transfers Overall transfer level: Needs assistance Equipment used: Rolling walker (2 wheels) Transfers: Sit to/from Stand, Bed to chair/wheelchair/BSC Sit to Stand: Contact guard assist     Step pivot transfers: Contact guard assist, Min assist            Balance Overall balance assessment: Needs assistance Sitting-balance support: No upper extremity supported, Feet supported Sitting balance-Leahy Scale: Fair     Standing balance support: Reliant on assistive device for balance Standing balance-Leahy Scale: Poor                             ADL either performed or assessed with clinical judgement   ADL Overall ADL's : Needs assistance/impaired Eating/Feeding: Set up;Sitting   Grooming: Wash/dry hands;Wash/dry face;Contact guard assist;Standing   Upper Body Bathing: Minimal assistance;Sitting   Lower Body Bathing: Moderate assistance;Sit to/from stand   Upper Body Dressing : Minimal assistance;Sitting   Lower Body Dressing: Moderate assistance;Sit to/from stand   Toilet Transfer: Regular Toilet;Ambulation;Minimal assistance Toilet Transfer Details (indicate cue type and reason): CGA for mobility with increased assist to direct RW Toileting- Clothing Manipulation and Hygiene: Contact guard assist;Sit to/from stand               Vision   Vision Assessment?: No apparent visual deficits     Perception  Perception: Within Functional Limits       Praxis Praxis: WFL       Pertinent Vitals/Pain Pain Assessment Pain Assessment: No/denies pain Pain Intervention(s): Monitored during session     Extremity/Trunk Assessment Upper Extremity Assessment Upper Extremity Assessment: Generalized weakness;Right hand dominant;RUE  deficits/detail;LUE deficits/detail RUE Deficits / Details: ataxic RUE Sensation: WNL RUE Coordination: decreased fine motor;decreased gross motor LUE Deficits / Details: ataxic LUE Sensation: WNL LUE Coordination: decreased fine motor;decreased gross motor   Lower Extremity Assessment Lower Extremity Assessment: Defer to PT evaluation   Cervical / Trunk Assessment Cervical / Trunk Assessment: Normal Cervical / Trunk Exceptions: dragging R foot and R knee buckle noted, patient has had increased weakness for months.   Communication Communication Communication: Impaired Factors Affecting Communication: Reduced clarity of speech;Difficulty expressing self   Cognition Arousal: Alert Behavior During Therapy: Flat affect Cognition: No family/caregiver present to determine baseline             OT - Cognition Comments: Patient does follow commands, oriented to place, time and situation.  Decreased insight and safety.                 Following commands: Intact       Cueing  General Comments   Cueing Techniques: Verbal cues   Watch BP 165/106 on eval   Exercises     Shoulder Instructions      Home Living Family/patient expects to be discharged to:: Shelter/Homeless                                 Additional Comments: States she homeless with no where to go.      Prior Functioning/Environment Prior Level of Function : History of Falls (last six months)             Mobility Comments: States she uses a 2WRW for mobility, prior history of falls. ADLs Comments: Patient states she takes care of herself the best she can.    OT Problem List: Decreased strength;Decreased activity tolerance;Impaired balance (sitting and/or standing);Decreased coordination;Decreased safety awareness   OT Treatment/Interventions: Self-care/ADL training;Therapeutic activities;Therapeutic exercise;Patient/family education;Balance training;DME and/or AE instruction       OT Goals(Current goals can be found in the care plan section)   Acute Rehab OT Goals Patient Stated Goal: Get stronger and walk better OT Goal Formulation: With patient Time For Goal Achievement: 11/03/23 Potential to Achieve Goals: Fair ADL Goals Pt Will Perform Grooming: standing;with modified independence Pt Will Perform Upper Body Dressing: with modified independence;sitting Pt Will Perform Lower Body Dressing: with modified independence;sit to/from stand Pt Will Transfer to Toilet: with modified independence;ambulating;regular height toilet Pt/caregiver will Perform Home Exercise Program: Increased strength;Both right and left upper extremity;With theraband;With written HEP provided;With Supervision   OT Frequency:  Min 1X/week    Co-evaluation              AM-PAC OT "6 Clicks" Daily Activity     Outcome Measure Help from another person eating meals?: A Little Help from another person taking care of personal grooming?: A Little Help from another person toileting, which includes using toliet, bedpan, or urinal?: A Little Help from another person bathing (including washing, rinsing, drying)?: A Lot Help from another person to put on and taking off regular upper body clothing?: A Little Help from another person to put on and taking off regular lower body clothing?: A Lot 6 Click Score: 16  End of Session Equipment Utilized During Treatment: Gait belt;Rolling walker (2 wheels) Nurse Communication: Mobility status  Activity Tolerance: Patient tolerated treatment well Patient left: in bed;with call bell/phone within reach  OT Visit Diagnosis: Unsteadiness on feet (R26.81);Muscle weakness (generalized) (M62.81);History of falling (Z91.81);Ataxia, unspecified (R27.0)                Time: 1610-9604 OT Time Calculation (min): 23 min Charges:  OT General Charges $OT Visit: 1 Visit OT Evaluation $OT Eval Moderate Complexity: 1 Mod OT Treatments $Self Care/Home  Management : 8-22 mins  10/20/2023  RP, OTR/L  Acute Rehabilitation Services  Office:  [phone]   Suzanna Obey 10/20/2023, 12:03 PM

## 2023-10-20 NOTE — ED Notes (Signed)
 CCMD notified of patient being transported to inpatient unit.

## 2023-10-20 NOTE — Hospital Course (Addendum)
 The patient is a 53 year old African-American female with a past medical history significant for Mann[place] to HIV, depression, anxiety, history of DVT, substance abuse and other comorbidities who presented with right-sided weakness.  She also reported 3 to 4 weeks of right facial, right arm, right leg weakness and because her symptoms are not improving and had difficulty ambulating she presented to the ED.  Further workup reveals an acute CVA with likely embolic in nature however workup was been negative.  Neurology recommending dual antiplatelet therapy for 3 weeks now and then just aspirin monotherapy.  PT OT recommending SNF and she is completed her stroke workup but neurology recommending contacting them if she has any episodes of A-fib during the hospitalization.  Hypercoagulable panel has been obtained and can be followed-up in outpatient setting as she is Medically Stable to D/C to SNF pending CTA findings  Assessment and Plan:  Acute Ischemic CVA  -CT Head Negative and CTA Head and Neck showed: no LVO or stenosis. -MRI Brain w/o Contrast: Few subcentimeter acute to subacute ischemic infarct involving the right frontal and parietal lobes as well as the left posterior corpus callosum.  -C/w Tele monitoring to evaluate for A Fib and neuro checks,  -ECHO:  LVEF of 60-65 with no RWMA and G1DD; Also w findings of  RV pressure/volume overlaod and moderately reduced RVSF with small pericardial effusion -LE Duplex showed no DVT b/l but ruling out PE as below;  -Lipid Panel as below. HbA1c pending;  -Started ASA 81 mg and Plavix 75 mg daily for 3 weeks and then just ASA monotherapy, Permissive HTN, Therapies recommending SNF -Hypercoag Panel (pending) as CVA could be embolic.  -Neuro recommending following up at the [facility] clinic in 6 to 8 weeks -Appears medical clear for D/C pending CTA PE Protocol   HIV/AIDS:  Diagnosed during hospitalization in September 2024 and started on Biktarvy and Bactrim but has  not taken since discharge, CD4 was <35 and VL 162k in September 2024. Now ESR is 29. She is agreeable to resuming treatment with Biktarvy and Bactrim, encouraged to follow-up with ID on discharge in the Interim we will repeat a CD4 count and VL while hospitalized  Cough and Congestion Reduced RVSF r/o PE: CXR Neg; SARS-CoV-2, Influenza A/B and RSV Panel pending. Add Guaifenesin 1200 mg po BID, Flutter and Flutter Valve. F/U D-Dimer and CTA chest PE   Essential Hypertension: Allow for Permissive HTN to 220/110 for now and holding her Home Amlodipine 5 mg po Daily. CTM BP per Protocol. Last BP reading was 130/96  HLD: Lipid Panel done and showed a total Cholesterol/HDL ratio 3.7, Cholesterol 175, HDL 47, LDL 102, TG of 129, VLDL 26. C/w Atrovastatin 40 mg po Daily   Depression and Anxiety: Encouraged to reestablish with [facility]; No longer taking Escitalopram 10 mg po Daily per [facility] and will not resume currently given prolonged qTC   Prolonged QT interval:  Supplement potassium, check magnesium level, avoid QT-prolonging medications   Leukopenia: In the setting of HIV. WBC went from 2.9 -> 2.7. CTM for S/Sx of Infection and Repeat CBC in  the AM  Thrombocytopenia: Plt Count dropped and went from 160 -> 125. CTM for S/Sx of Bleeding; No overt bleeding noted. Repeat CBC in the AM.   THC Abuse: Tested + for THC on UDS. Counseling given  Hypoalbuminemia: Patient's Albumin was 3.2. CTM and Trend and repeat CMP in the AM

## 2023-10-20 NOTE — H&P (Signed)
 History and Physical    Christina [name] ZOX:[id number] DOB: February 17, 1971 DOA: 10/19/2023  PCP: Pcp, No   Patient coming from: Home   Chief Complaint: Right-sided weakness   HPI: Christina [name] is a 53 y.o. female with medical history significant for HIV, depression, anxiety, DVT, and substance abuse who presents with right-sided weakness.   Patient reports 3 to 4 weeks of right facial, right arm, and right leg weakness.  Symptoms were not improving, she was having increasing difficulty ambulating despite use of a walker, and this eventually prompted presentation to the ED.    Patient reports that she has not taken any prescription medications since hospital discharge in September 2024.  She has difficulty providing an explanation for not taking the medications but notes that she is ashamed about her diagnoses and tries not to think about her medical problems.  ED Course: Upon arrival to the ED, patient is found to be afebrile and saturating well on room air with normal heart rate and elevated blood pressure.  EKG demonstrates sinus rhythm with RAD.  Head CT is negative for acute findings.  MRI brain reveals scattered subcentimeter acute to early subacute ischemic infarctions.  Labs are most notable for normal renal function, WBC 2900 with ANC 1392, and normal troponin x 2.    Patient was evaluated by neurology in the ED and was started on aspirin and Plavix.  Review of Systems:  All other systems reviewed and apart from HPI, are negative.  Past Medical History:  Diagnosis Date   Anxiety    Back pain    DVT (deep venous thrombosis) (HCC)    left leg with stent    Past Surgical History:  Procedure Laterality Date   CORONARY ULTRASOUND/IVUS Left 11/03/2016   Procedure: Intravascular Ultrasound/IVUS;  Surgeon: Maeola Harman, MD;  Location: [facility] INVASIVE CV LAB;  Service: Cardiovascular;  Laterality: Left;   LOWER EXTREMITY VENOGRAPHY Left 11/04/2016   Procedure: Lower Extremity  Venography;  Surgeon: Nada Libman, MD;  Location: [facility] INVASIVE CV LAB;  Service: Cardiovascular;  Laterality: Left;   PERIPHERAL VASCULAR INTERVENTION Left 11/04/2016   Procedure: Peripheral Vascular Intervention;  Surgeon: Nada Libman, MD;  Location: [facility] INVASIVE CV LAB;  Service: Cardiovascular;  Laterality: Left;  common external   PERIPHERAL VASCULAR THROMBECTOMY Left 11/03/2016   Procedure: Peripheral Vascular Thrombectomy;  Surgeon: Maeola Harman, MD;  Location: [facility] INVASIVE CV LAB;  Service: Cardiovascular;  Laterality: Left;    Social History:   reports that she has been smoking cigarettes. She has never used smokeless tobacco. She reports current alcohol use. She reports current drug use. Drug: Marijuana.  No Known Allergies  History reviewed. No pertinent family history.   Prior to Admission medications   Medication Sig Start Date End Date Taking? Authorizing Provider  acetaminophen (TYLENOL) 325 MG tablet Take 2 tablets (650 mg total) by mouth every 6 (six) hours as needed for mild pain, moderate pain or headache. Patient not taking: Reported on 09/02/2023 10/28/18   Aldean Baker, NP  amLODipine (NORVASC) 5 MG tablet Take 1 tablet (5 mg total) by mouth daily. Patient not taking: Reported on 09/02/2023 05/05/23 05/04/24  Pokhrel, Rebekah Chesterfield, MD  bictegravir-emtricitabine-tenofovir AF (BIKTARVY) 50-200-25 MG TABS tablet Take 1 tablet by mouth daily. Patient not taking: Reported on 09/02/2023 05/05/23   Pokhrel, Rebekah Chesterfield, MD  escitalopram (LEXAPRO) 10 MG tablet Take 1 tablet (10 mg total) by mouth daily. Patient not taking: Reported on 09/02/2023 05/05/23 05/04/24  Joycelyn Das, MD  oxyCODONE-acetaminophen (PERCOCET/ROXICET) 5-325 MG tablet Take 1-2 tablets by mouth every 8 (eight) hours as needed for severe pain. Patient not taking: Reported on 09/02/2023 04/27/23   Benjiman Core, MD  sulfamethoxazole-trimethoprim (BACTRIM DS) 800-160 MG tablet Take 1 tablet by mouth 3 (three)  times a week. Patient not taking: Reported on 09/02/2023 05/06/23   Joycelyn Das, MD    Physical Exam: Vitals:   10/20/23 0030 10/20/23 0045 10/20/23 0052 10/20/23 0100  BP: (!) 137/101   136/72  Pulse: (!) 102   97  Resp:  15  18  Temp:   97.7 F (36.5 C)   TempSrc:   Axillary   SpO2: 100%   100%  Weight:      Height:        Constitutional: NAD, no pallor or diaphoresis   Eyes: PERTLA, lids and conjunctivae normal ENMT: Mucous membranes are moist. Posterior pharynx clear of any exudate or lesions.   Neck: supple, no masses  Respiratory: no wheezing, no crackles. No accessory muscle use.  Cardiovascular: S1 & S2 heard, regular rate and rhythm. No extremity edema.   Abdomen: no tenderness, soft. Bowel sounds active.  Musculoskeletal: no clubbing / cyanosis. No joint deformity upper and lower extremities.   Skin: no significant rashes, lesions, ulcers. Warm, dry, well-perfused. Neurologic: Right facial droop. Dysarthria. Sensation to light touch intact. Strength 4/5 in proximal RUE and RLE, otherwise 5/5. Alert and oriented to person, place, and situation.  Psychiatric: Tearful. Cooperative.    Labs and Imaging on Admission: I have personally reviewed following labs and imaging studies  CBC: Recent Labs  Lab 10/19/23 1254  WBC 2.9*  NEUTROABS 1.4*  HGB 14.0  HCT 43.5  MCV 86.3  PLT 160   Basic Metabolic Panel: Recent Labs  Lab 10/19/23 1254  NA 142  K 3.6  CL 107  CO2 22  GLUCOSE 89  BUN 5*  CREATININE 0.90  CALCIUM 8.7*   GFR: Estimated Creatinine Clearance: 61.6 mL/min (by C-G formula based on SCr of 0.9 mg/dL). Liver Function Tests: Recent Labs  Lab 10/19/23 1254  AST 24  ALT 16  ALKPHOS 87  BILITOT 0.6  PROT 7.8  ALBUMIN 3.2*   No results for input(s): "LIPASE", "AMYLASE" in the last 168 hours. No results for input(s): "AMMONIA" in the last 168 hours. Coagulation Profile: No results for input(s): "INR", "PROTIME" in the last 168  hours. Cardiac Enzymes: No results for input(s): "CKTOTAL", "CKMB", "CKMBINDEX", "TROPONINI" in the last 168 hours. BNP (last 3 results) No results for input(s): "PROBNP" in the last 8760 hours. HbA1C: No results for input(s): "HGBA1C" in the last 72 hours. CBG: No results for input(s): "GLUCAP" in the last 168 hours. Lipid Profile: No results for input(s): "CHOL", "HDL", "LDLCALC", "TRIG", "CHOLHDL", "LDLDIRECT" in the last 72 hours. Thyroid Function Tests: No results for input(s): "TSH", "T4TOTAL", "FREET4", "T3FREE", "THYROIDAB" in the last 72 hours. Anemia Panel: No results for input(s): "VITAMINB12", "FOLATE", "FERRITIN", "TIBC", "IRON", "RETICCTPCT" in the last 72 hours. Urine analysis:    Component Value Date/Time   COLORURINE AMBER (A) 09/01/2023 1815   APPEARANCEUR HAZY (A) 09/01/2023 1815   LABSPEC 1.030 09/01/2023 1815   PHURINE 5.0 09/01/2023 1815   GLUCOSEU NEGATIVE 09/01/2023 1815   HGBUR NEGATIVE 09/01/2023 1815   BILIRUBINUR SMALL (A) 09/01/2023 1815   KETONESUR 5 (A) 09/01/2023 1815   PROTEINUR 100 (A) 09/01/2023 1815   UROBILINOGEN 0.2 01/13/2018 1514   NITRITE NEGATIVE 09/01/2023 1815   LEUKOCYTESUR NEGATIVE 09/01/2023 1815  Sepsis Labs: @LABRCNTIP (procalcitonin:4,lacticidven:4) )No results found for this or any previous visit (from the past 240 hours).   Radiological Exams on Admission: MR Brain Wo Contrast (neuro protocol) Result Date: 10/19/2023 CLINICAL DATA:  Initial evaluation for acute neuro deficit, stroke suspected. EXAM: MRI HEAD WITHOUT CONTRAST TECHNIQUE: Multiplanar, multiecho pulse sequences of the brain and surrounding structures were obtained without intravenous contrast. COMPARISON:  Prior CT from earlier the same day. FINDINGS: Brain: Mild age-related cerebral atrophy. Patchy and confluent T2/FLAIR hyperintensity involving the periventricular and deep white matter both cerebral hemispheres as well as the pons, consistent with chronic small  vessel ischemic disease, moderate to advanced in nature. Multiple superimposed remote lacunar infarcts present about the hemispheric cerebral white matter, deep gray nuclei, pons, and cerebellum. Few scattered subcentimeter foci of restricted diffusion are seen involving the right frontal and parietal lobes (series 5, images 66-63). Additional subcentimeter focus of diffusion signal abnormality noted in the region of the posterior left corpus callosum (series 5, image 62). Findings consistent with small evolving acute to early subacute ischemic infarcts. No associated hemorrhage or mass effect. No acute intracranial hemorrhage elsewhere. Few scattered chronic micro hemorrhages noted about the pons and left cerebral hemisphere, likely hypertensive in nature. No mass lesion, midline shift or mass effect. No hydrocephalus or extra-axial fluid collection. Pituitary gland and suprasellar region within normal limits. Vascular: Major intracranial vascular flow voids are maintained. Skull and upper cervical spine: Craniocervical junction within normal limits. Decreased T1 signal intensity noted within the visualized bone marrow, nonspecific, but most commonly related to anemia, smoking or obesity. No scalp soft tissue abnormality. Sinuses/Orbits: Globes and orbital soft tissues within normal limits. Mild mucosal thickening noted about the ethmoidal air cells and maxillary sinuses. No significant mastoid effusion. Other: None. IMPRESSION: 1. Few scattered subcentimeter acute to early subacute ischemic infarcts involving the right frontal and parietal lobes as well as the left posterior corpus callosum. No associated hemorrhage or mass effect. 2. Underlying age-related cerebral atrophy with advanced chronic microvascular ischemic disease, with multiple remote lacunar infarcts involving the hemispheric cerebral white matter, deep gray nuclei, pons, and cerebellum. Electronically Signed   By: Rise Mu M.D.   On:  10/19/2023 20:51   CT Head Wo Contrast Result Date: 10/19/2023 CLINICAL DATA:  Neuro deficit, acute, stroke suspected. EXAM: CT HEAD WITHOUT CONTRAST TECHNIQUE: Contiguous axial images were obtained from the base of the skull through the vertex without intravenous contrast. RADIATION DOSE REDUCTION: This exam was performed according to the departmental dose-optimization program which includes automated exposure control, adjustment of the mA and/or kV according to patient size and/or use of iterative reconstruction technique. COMPARISON:  Head CT 09/12/2023 FINDINGS: Brain: There is no evidence of an acute infarct, intracranial hemorrhage, mass, midline shift, or extra-axial fluid collection. Patchy hypodensities in the cerebral white matter bilaterally are similar to the prior CT and nonspecific but compatible with age advanced chronic small vessel ischemic disease with multiple chronic lacunar infarcts. There is unchanged mild cerebral atrophy. Vascular: No hyperdense vessel. Skull: No acute fracture or suspicious lesion. Sinuses/Orbits: Visualized paranasal sinuses and mastoid air cells are clear. Unremarkable orbits. Other: None. IMPRESSION: 1. No evidence of acute intracranial abnormality. 2. Age advanced chronic small vessel ischemic disease. Electronically Signed   By: Sebastian Ache M.D.   On: 10/19/2023 14:57    EKG: Independently reviewed. Sinus rhythm, RAD, QTc 513.   Assessment/Plan   1. Ischemic CVA  - Continue cardiac monitoring and neuro checks, check CTA head & neck, TTE,  lipds, and A1c, start ASA 81 mg and Plavix 75 mg daily, permit HTN, consult PT/OT/SLP    2. HIV  - Diagnosed during hospitalization in September 2024 and started on Biktarvy and Bactrim but has not taken since discharge  - CD4 was <35 and VL 162k in September 2024  - She is agreeable to resuming treatment with Biktarvy and Bactrim, encouraged to follow-up with ID on discharge    3. Hypertension  - Permit HTN to  220/110 for now    4. Depression, anxiety  - Encouraged to reestablish with Behavioral Health clinic    5. Prolonged QT interval  - Supplement potassium, check magnesium level, avoid QT-prolonging medications    DVT prophylaxis: Lovenox  Code Status: Full  Level of Care: Level of care: Telemetry Medical Family Communication: Home  Disposition Plan:  Patient is from: Home Anticipated d/c is to: TBD Anticipated d/c date is: 2/25 or 10/21/23  Patient currently: Pending CVA workup  Consults called: Neurology  Admission status: Observation     Briscoe Deutscher, MD [facility]  10/20/2023, 1:58 AM

## 2023-10-20 NOTE — Progress Notes (Signed)
 Venous duplex lower ext  has been completed. Refer to [facility] under chart review to view preliminary results.   10/20/2023  9:14 AM Anagabriela Jokerst, Gerarda Gunther

## 2023-10-20 NOTE — Progress Notes (Signed)
*  PRELIMINARY RESULTS* Echocardiogram 2D Echocardiogram has been performed.  Laddie Aquas 10/20/2023, 9:45 AM

## 2023-10-20 NOTE — ED Notes (Signed)
 X-ray at bedside
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
## 2023-10-20 NOTE — ED Notes (Signed)
Phlebotomy to obtain labs. 

## 2023-10-20 NOTE — Progress Notes (Addendum)
 STROKE TEAM PROGRESS NOTE    SIGNIFICANT HOSPITAL EVENTS 2/24-scented with right facial droop right-sided weakness and slurred speech MRI brain Few scattered subcentimeter acute to early subacute ischemic infarcts involving the right frontal and parietal lobes as well as the left posterior corpus callosum.  INTERIM HISTORY/SUBJECTIVE No family at the bedside. Patient states that she has had right side weakness facial droop and slurred speech for couple weeks now.  She does not take any medications as she has trouble affording them and she is living in a hotel currently.  She states that about 3 years ago she had a DVT in her leg and was on Xarelto however she stopped taking it because she was unable to afford it  Uses a walker at baseline   OBJECTIVE  CBC    Component Value Date/Time   WBC 2.7 (L) 10/20/2023 0456   RBC 4.90 10/20/2023 0456   HGB 13.6 10/20/2023 0456   HCT 42.0 10/20/2023 0456   PLT 125 (L) 10/20/2023 0456   MCV 85.7 10/20/2023 0456   MCH 27.8 10/20/2023 0456   MCHC 32.4 10/20/2023 0456   RDW 17.2 (H) 10/20/2023 0456   LYMPHSABS 1.2 10/19/2023 1254   MONOABS 0.2 10/19/2023 1254   EOSABS 0.0 10/19/2023 1254   BASOSABS 0.0 10/19/2023 1254    BMET    Component Value Date/Time   NA 143 10/20/2023 0456   NA 144 11/11/2016 1556   K 3.5 10/20/2023 0456   CL 105 10/20/2023 0456   CO2 25 10/20/2023 0456   GLUCOSE 92 10/20/2023 0456   BUN 9 10/20/2023 0456   BUN 7 11/11/2016 1556   CREATININE 0.94 10/20/2023 0456   CALCIUM 8.3 (L) 10/20/2023 0456   GFRNONAA >60 10/20/2023 0456    IMAGING past 24 hours ECHOCARDIOGRAM COMPLETE Result Date: 10/20/2023    ECHOCARDIOGRAM REPORT   Patient Name:   Christina [name] Date of Exam: 10/20/2023 Medical Rec #:  [id number]      Height:       65.0 in Accession #:    [id number]     Weight:       119.0 lb Date of Birth:  1971/03/29     BSA:          1.587 m Patient Age:    52 years       BP:           123/86 mmHg Patient  Gender: F              HR:           77 bpm. Exam Location:  Inpatient Procedure: 2D Echo (Both Spectral and Color Flow Doppler were utilized during            procedure). Indications:    Stroke  History:        Patient has no prior history of Echocardiogram examinations. HIV                 HlO DVT Substance abuse; Risk Factors:Hypertension and Current                 Smoker.  Sonographer:    Christina [name] RVT RCS Referring Phys: [phone] Christina [name] IMPRESSIONS  1. Left ventricular ejection fraction, by estimation, is 60 to 65%. The left ventricle has normal function. The left ventricle has no regional wall motion abnormalities. Left ventricular diastolic parameters are consistent with Grade I diastolic dysfunction (impaired relaxation).  2. D-shaped interventricular septum suggestive of RV pressure/volume  overload. Right ventricular systolic function is moderately reduced. The right ventricular size is moderately enlarged. There is severely elevated pulmonary artery systolic pressure. The estimated right ventricular systolic pressure is 77.1 mmHg.  3. Right atrial size was moderately dilated.  4. The mitral valve is normal in structure. No evidence of mitral valve regurgitation. No evidence of mitral stenosis.  5. Tricuspid valve regurgitation is moderate.  6. The aortic valve is tricuspid. Aortic valve regurgitation is not visualized. No aortic stenosis is present.  7. The inferior vena cava is dilated in size with <50% respiratory variability, suggesting right atrial pressure of 15 mmHg.  8. A small pericardial effusion is present. FINDINGS  Left Ventricle: Left ventricular ejection fraction, by estimation, is 60 to 65%. The left ventricle has normal function. The left ventricle has no regional wall motion abnormalities. Strain imaging was not performed. The left ventricular internal cavity  size was normal in size. There is no left ventricular hypertrophy. Left ventricular diastolic parameters are consistent  with Grade I diastolic dysfunction (impaired relaxation). Right Ventricle: D-shaped interventricular septum suggestive of RV pressure/volume overload. The right ventricular size is moderately enlarged. No increase in right ventricular wall thickness. Right ventricular systolic function is moderately reduced. There is severely elevated pulmonary artery systolic pressure. The tricuspid regurgitant velocity is 3.94 m/s, and with an assumed right atrial pressure of 15 mmHg, the estimated right ventricular systolic pressure is 77.1 mmHg. Left Atrium: Left atrial size was normal in size. Right Atrium: Right atrial size was moderately dilated. Pericardium: A small pericardial effusion is present. Mitral Valve: The mitral valve is normal in structure. No evidence of mitral valve regurgitation. No evidence of mitral valve stenosis. Tricuspid Valve: The tricuspid valve is normal in structure. Tricuspid valve regurgitation is moderate. Aortic Valve: The aortic valve is tricuspid. Aortic valve regurgitation is not visualized. No aortic stenosis is present. Aortic valve mean gradient measures 1.0 mmHg. Aortic valve peak gradient measures 2.5 mmHg. Aortic valve area, by VTI measures 2.30 cm. Pulmonic Valve: The pulmonic valve was normal in structure. Pulmonic valve regurgitation is mild. Aorta: The aortic root is normal in size and structure. Venous: The inferior vena cava is dilated in size with less than 50% respiratory variability, suggesting right atrial pressure of 15 mmHg. IAS/Shunts: No atrial level shunt detected by color flow Doppler. Additional Comments: 3D imaging was not performed.  LEFT VENTRICLE PLAX 2D LVIDd:         3.10 cm   Diastology LVIDs:         2.20 cm   LV e' medial:    5.77 cm/s LV PW:         1.20 cm   LV E/e' medial:  6.8 LV IVS:        1.10 cm   LV e' lateral:   9.28 cm/s LVOT diam:     1.60 cm   LV E/e' lateral: 4.2 LV SV:         31 LV SV Index:   19 LVOT Area:     2.01 cm  RIGHT VENTRICLE             IVC RV Basal diam:  4.50 cm    IVC diam: 2.40 cm RV Mid diam:    4.00 cm RV S prime:     8.31 cm/s TAPSE (M-mode): 1.3 cm LEFT ATRIUM             Index        RIGHT ATRIUM  Index LA Vol (A2C):   20.7 ml 13.05 ml/m  RA Area:     18.50 cm LA Vol (A4C):   27.1 ml 17.08 ml/m  RA Volume:   59.10 ml  37.25 ml/m LA Biplane Vol: 24.4 ml 15.38 ml/m  AORTIC VALVE                    PULMONIC VALVE AV Area (Vmax):    2.01 cm     PV Vmax:          0.41 m/s AV Area (Vmean):   2.06 cm     PV Peak grad:     0.7 mmHg AV Area (VTI):     2.30 cm     PR End Diast Vel: 10.37 msec AV Vmax:           78.40 cm/s AV Vmean:          49.900 cm/s AV VTI:            0.133 m AV Peak Grad:      2.5 mmHg AV Mean Grad:      1.0 mmHg LVOT Vmax:         78.50 cm/s LVOT Vmean:        51.200 cm/s LVOT VTI:          0.152 m LVOT/AV VTI ratio: 1.14  AORTA Ao Root diam: 3.30 cm Ao Asc diam:  3.00 cm MITRAL VALVE               TRICUSPID VALVE MV Area (PHT): 3.53 cm    TR Peak grad:   62.1 mmHg MV Decel Time: 215 msec    TR Vmax:        394.00 cm/s MV E velocity: 39.10 cm/s MV A velocity: 53.90 cm/s  SHUNTS MV E/A ratio:  0.73        Systemic VTI:  0.15 m                            Systemic Diam: 1.60 cm Dalton McleanMD Electronically signed by Wilfred Lacy Signature Date/Time: 10/20/2023/9:58:17 AM    Final    VAS [place] LOWER EXTREMITY VENOUS (DVT) Result Date: 10/20/2023  Lower Venous DVT Study Patient Name:  Christina [name]  Date of Exam:   10/20/2023 Medical Rec #: [id number]       Accession #:    [id number] Date of Birth: [birth year]      Patient Gender: F Patient Age:   35 years Exam Location:  [facility] Procedure:      VAS [place] LOWER EXTREMITY VENOUS (DVT) Referring Phys: Leticia Penna --------------------------------------------------------------------------------  Indications: Stroke. 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 Comparison Study: 10/01/18 - Negative Performing Technologist: [place] Sink Sturdivant-Jones RDMS, RVT  Examination Guidelines: A complete evaluation includes B-mode imaging, spectral Doppler, color Doppler, and power Doppler as needed of all accessible portions of each vessel. Bilateral testing is considered an integral part of a complete examination. Limited examinations for reoccurring indications may be performed as noted. The reflux portion of the exam is performed with the patient in reverse Trendelenburg.  +---------+---------------+---------+-----------+----------+--------------+ RIGHT  CompressibilityPhasicitySpontaneityPropertiesThrombus Aging +---------+---------------+---------+-----------+----------+--------------+ CFV      Full           Yes      Yes                                 +---------+---------------+---------+-----------+----------+--------------+ SFJ      Full                                                        +---------+---------------+---------+-----------+----------+--------------+ FV Prox  Full                                                        +---------+---------------+---------+-----------+----------+--------------+ FV Mid   Full                                                        +---------+---------------+---------+-----------+----------+--------------+ FV DistalFull                                                        +---------+---------------+---------+-----------+----------+--------------+ PFV      Full                                                        +---------+---------------+---------+-----------+----------+--------------+ POP      Full           Yes      Yes                                 +---------+---------------+---------+-----------+----------+--------------+ PTV      Full                                                         +---------+---------------+---------+-----------+----------+--------------+ PERO     Full                                                        +---------+---------------+---------+-----------+----------+--------------+   +---------+---------------+---------+-----------+----------+--------------+ LEFT     CompressibilityPhasicitySpontaneityPropertiesThrombus Aging +---------+---------------+---------+-----------+----------+--------------+ CFV      Full           Yes      Yes                                 +---------+---------------+---------+-----------+----------+--------------+  SFJ      Full                                                        +---------+---------------+---------+-----------+----------+--------------+ FV Prox  Full                                                        +---------+---------------+---------+-----------+----------+--------------+ FV Mid   Full                                                        +---------+---------------+---------+-----------+----------+--------------+ FV DistalFull                                                        +---------+---------------+---------+-----------+----------+--------------+ PFV      Full                                                        +---------+---------------+---------+-----------+----------+--------------+ POP      Full           Yes      Yes                                 +---------+---------------+---------+-----------+----------+--------------+ PTV      Full                                                        +---------+---------------+---------+-----------+----------+--------------+ PERO     Full                                                        +---------+---------------+---------+-----------+----------+--------------+    Summary: BILATERAL: - No evidence of deep vein thrombosis seen in the lower extremities, bilaterally. -No evidence of  popliteal cyst, bilaterally.   *See table(s) above for measurements and observations.    Preliminary    CT ANGIO HEAD NECK W WO CM Result Date: 10/20/2023 CLINICAL DATA:  Stroke, determine embolic source EXAM: CT ANGIOGRAPHY HEAD AND NECK WITH AND WITHOUT CONTRAST TECHNIQUE: Multidetector CT imaging of the head and neck was performed using the standard protocol during bolus administration of intravenous contrast. Multiplanar CT image reconstructions and MIPs were obtained to evaluate the vascular anatomy. Carotid stenosis measurements (when applicable) are obtained utilizing NASCET criteria, using  the distal internal carotid diameter as the denominator. RADIATION DOSE REDUCTION: This exam was performed according to the departmental dose-optimization program which includes automated exposure control, adjustment of the mA and/or kV according to patient size and/or use of iterative reconstruction technique. CONTRAST:  75mL OMNIPAQUE IOHEXOL 350 MG/ML SOLN COMPARISON:  Brain MRI from yesterday FINDINGS: CT HEAD FINDINGS Brain: Chronic small vessel infarcts underestimated relative to prior brain MRI, seen in the deep white matter and brainstem. Recent infarcts are occult when compared to MRI. No hemorrhage, hydrocephalus, or shift. Vascular: See below Skull: Normal. Negative for fracture or focal lesion. Sinuses/Orbits: No acute finding. Review of the MIP images confirms the above findings CTA NECK FINDINGS Aortic arch: Aberrant right subclavian artery. Right carotid system: Major vessels are smoothly contoured and widely patent without atheromatous change. Left carotid system: Major vessels are smoothly contoured and widely patent without atheromatous change. Vertebral arteries: Major vessels are smoothly contoured and widely patent without atheromatous change. Skeleton: Generalized degeneration of the cervical spine. No acute or aggressive finding Other neck: Generous sized lymph nodes throughout the neck with  homogeneous appearance. These may relate to patient's history of HIV. Upper chest: Dilated main pulmonary artery at 3.4 cm, usually indicating pulmonary Review of the MIP images confirms the above findings CTA HEAD FINDINGS Anterior circulation: No significant stenosis, proximal occlusion, aneurysm, or vascular malformation. Posterior circulation: Comparatively Fainter density of the right vertebral artery which may relate to relatively delayed filling from aberrant right subclavian artery with circuitous course. No branch occlusion, beading, or aneurysm. Venous sinuses: Not opacified on this arterial study. Anatomic variants: Hypoplastic right P1 segment. Review of the MIP images confirms the above findings IMPRESSION: 1. No emergent arterial finding. No major vessel irregularity or flow reducing stenosis. 2. Mildly enlarged lymph nodes throughout the neck, possibly related HIV status. In the absence of priors, recommend clinical or imaging follow-up for stability. Electronically Signed   By: Tiburcio Pea M.D.   On: 10/20/2023 05:39   MR Brain Wo Contrast (neuro protocol) Result Date: 10/19/2023 CLINICAL DATA:  Initial evaluation for acute neuro deficit, stroke suspected. EXAM: MRI HEAD WITHOUT CONTRAST TECHNIQUE: Multiplanar, multiecho pulse sequences of the brain and surrounding structures were obtained without intravenous contrast. COMPARISON:  Prior CT from earlier the same day. FINDINGS: Brain: Mild age-related cerebral atrophy. Patchy and confluent T2/FLAIR hyperintensity involving the periventricular and deep white matter both cerebral hemispheres as well as the pons, consistent with chronic small vessel ischemic disease, moderate to advanced in nature. Multiple superimposed remote lacunar infarcts present about the hemispheric cerebral white matter, deep gray nuclei, pons, and cerebellum. Few scattered subcentimeter foci of restricted diffusion are seen involving the right frontal and parietal lobes  (series 5, images 66-63). Additional subcentimeter focus of diffusion signal abnormality noted in the region of the posterior left corpus callosum (series 5, image 62). Findings consistent with small evolving acute to early subacute ischemic infarcts. No associated hemorrhage or mass effect. No acute intracranial hemorrhage elsewhere. Few scattered chronic micro hemorrhages noted about the pons and left cerebral hemisphere, likely hypertensive in nature. No mass lesion, midline shift or mass effect. No hydrocephalus or extra-axial fluid collection. Pituitary gland and suprasellar region within normal limits. Vascular: Major intracranial vascular flow voids are maintained. Skull and upper cervical spine: Craniocervical junction within normal limits. Decreased T1 signal intensity noted within the visualized bone marrow, nonspecific, but most commonly related to anemia, smoking or obesity. No scalp soft tissue abnormality. Sinuses/Orbits: Globes and orbital soft tissues  within normal limits. Mild mucosal thickening noted about the ethmoidal air cells and maxillary sinuses. No significant mastoid effusion. Other: None. IMPRESSION: 1. Few scattered subcentimeter acute to early subacute ischemic infarcts involving the right frontal and parietal lobes as well as the left posterior corpus callosum. No associated hemorrhage or mass effect. 2. Underlying age-related cerebral atrophy with advanced chronic microvascular ischemic disease, with multiple remote lacunar infarcts involving the hemispheric cerebral white matter, deep gray nuclei, pons, and cerebellum. Electronically Signed   By: Rise Mu M.D.   On: 10/19/2023 20:51   CT Head Wo Contrast Result Date: 10/19/2023 CLINICAL DATA:  Neuro deficit, acute, stroke suspected. EXAM: CT HEAD WITHOUT CONTRAST TECHNIQUE: Contiguous axial images were obtained from the base of the skull through the vertex without intravenous contrast. RADIATION DOSE REDUCTION: This  exam was performed according to the departmental dose-optimization program which includes automated exposure control, adjustment of the mA and/or kV according to patient size and/or use of iterative reconstruction technique. COMPARISON:  Head CT 09/12/2023 FINDINGS: Brain: There is no evidence of an acute infarct, intracranial hemorrhage, mass, midline shift, or extra-axial fluid collection. Patchy hypodensities in the cerebral white matter bilaterally are similar to the prior CT and nonspecific but compatible with age advanced chronic small vessel ischemic disease with multiple chronic lacunar infarcts. There is unchanged mild cerebral atrophy. Vascular: No hyperdense vessel. Skull: No acute fracture or suspicious lesion. Sinuses/Orbits: Visualized paranasal sinuses and mastoid air cells are clear. Unremarkable orbits. Other: None. IMPRESSION: 1. No evidence of acute intracranial abnormality. 2. Age advanced chronic small vessel ischemic disease. Electronically Signed   By: Sebastian Ache M.D.   On: 10/19/2023 14:57    Vitals:   10/20/23 0600 10/20/23 0700 10/20/23 1000 10/20/23 1020  BP: 112/75 123/86 (!) 159/112 (!) 154/115  Pulse: 68 73 71 80  Resp:  17 (!) 22 20  Temp:  98 F (36.7 C) 97.9 F (36.6 C)   TempSrc:      SpO2: 100% 100% 99% 100%  Weight:      Height:         PHYSICAL EXAM General:  Alert, well-nourished, well-developed patient in no acute distress Psych:  Mood and affect appropriate for situation CV: Regular rate and rhythm on monitor Respiratory:  Regular, unlabored respirations on room air GI: Abdomen soft and nontender   NEURO:  Mental Status: AA&Ox3, patient is able to give clear and coherent history Speech/Language: Speech is dysarthric.  Naming, repetition, fluency, and comprehension intact.  Cranial Nerves:  II: PERRL. Visual fields full.  III, IV, VI: EOMI. Eyelids elevate symmetrically.  V: Sensation is intact to light touch and symmetrical to face.  VII:  Right facial droop VIII: hearing intact to voice. IX, X: Palate elevates symmetrically. Phonation is normal.  JY:NWGNFAOZ shrug 5/5. XII: tongue is midline without fasciculations. Motor: 5/5 in left upper and lower, right upper and lower 4/5 Tone: is normal and bulk is normal Sensation- Intact to light touch bilaterally. Extinction absent to light touch to DSS.   Coordination: Ataxia with right hand: no ataxia in BLE.No drift.  Gait- deferred  Most Recent NIH  1a Level of Conscious.: 0 1b LOC Questions: 0 1c LOC Commands: 0 2 Best Gaze: 0 3 Visual: 0 4 Facial Palsy: 2 5a Motor Arm - left: 0 5b Motor Arm - Right: 1 6a Motor Leg - Left: 0 6b Motor Leg - Right: 1 7 Limb Ataxia: 1 8 Sensory: 0 9 Best Language: 0 10 Dysarthria: 2  11 Extinct. and Inatten.: 0 TOTAL: 7   ASSESSMENT/PLAN  Ms. Christina [name] is a 53 y.o. 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   NIH on Admission 6  Acute Ischemic Infarct:  bilateral acute/subacute right frontal, parietal and left posterior corpus callosum  Etiology:  small vessel disease and uncontrolled risk factors   CT head No acute abnormality. Small vessel disease. CTA head & neck No LVO MRI  Few scattered subcentimeter acute to early subacute ischemic infarcts involving the right frontal and parietal lobes as well as the left posterior corpus callosum.  cerebral atrophy with advanced chronic microvascular ischemic disease, with multiple remote lacunar infarcts involving the hemispheric cerebral white matter, deep gray nuclei, pons, and cerebellum. LE Doppler   negative 2D Echo EF 60-65%. LV Grade I diastolic  dysfunction  Hypercoag panel sent  LDL 102 HgbA1c ordered VTE prophylaxis -  Lovenox  No antithrombotic prior to admission, now on aspirin 81 mg daily and clopidogrel 75 mg daily for 3 weeks and then ASA alone. Therapy recommendations:   Pending Disposition:  pending   Hx of Stroke/TIA  On brain imaging multiple remote lacunar infarcts involving the hemispheric cerebral white matter, deep gray nuclei, pons, and cerebellum.  Hypertension Home meds:  amlodipine 5mg   Stable Blood Pressure Goal: BP less than 180/105   Hyperlipidemia Home meds:  none LDL 102, goal < 70 Add atorvastatin 40mg    Continue statin at discharge  Tobacco Abuse Patient smokes cigarettes       Ready to quit? No Nicotine replacement therapy provided  Substance Abuse Patient uses [facility] UDS positive for  [facility]       Ready to quit? No TOC consult for cessation placed  Dysphagia Patient has post-stroke dysphagia, SLP consulted    Diet   Diet regular Fluid consistency: Thin   Advance diet as tolerated  Other Stroke Risk Factors ETOH use, alcohol level <10, advised to drink no more than 1 drink(s) a day    Other Active Problems Depression and anxiety  HIV  Hospital day # 0  Gevena Mart DNP, ACNPC-AG  [facility]  ATTENDING ATTESTATION:  53 year old with history of HIV noncompliant.  Stroke appear to be embolic however workup was negative.  Given history of noncompliance recommend DAPT therapy as above.  Hypercoagulable panel pending we will follow-up on this outpatient stroke visit [facility].  Continue PT OT therapy.  Neurology sign off. Follow-up outpatient go from neurological stroke clinic in 6 to 8 weeks after discharge  Contact neurology if there is any episodes of atrial fibrillation during her stay in the hospital.  Dr. Viviann Spare evaluated pt independently, reviewed imaging, chart, labs. Discussed and formulated plan with the Resident/APP. Changes were made to the note where appropriate. Please see APP/resident note above for details.   Total 36 minutes spent on counseling patient and coordinating care, writing notes and reviewing chart.   Revia Nghiem,MD   To contact Stroke Continuity provider,  please refer to [email or web address]. After hours, contact General Neurology

## 2023-10-20 NOTE — ED Notes (Signed)
 Pt to [place].

## 2023-10-21 DIAGNOSIS — I639 Cerebral infarction, unspecified: Secondary | ICD-10-CM | POA: Diagnosis not present

## 2023-10-21 LAB — T-HELPER CELLS (CD4) COUNT (NOT AT ARMC)
CD4 % Helper T Cell: 3 % — ABNORMAL LOW (ref 33–65)
CD4 T Cell Abs: 38 /uL — ABNORMAL LOW (ref 400–1790)

## 2023-10-21 LAB — COMPREHENSIVE METABOLIC PANEL
ALT: 17 U/L (ref 0–44)
AST: 26 U/L (ref 15–41)
Albumin: 2.5 g/dL — ABNORMAL LOW (ref 3.5–5.0)
Alkaline Phosphatase: 74 U/L (ref 38–126)
Anion gap: 8 (ref 5–15)
BUN: 9 mg/dL (ref 6–20)
CO2: 25 mmol/L (ref 22–32)
Calcium: 8.1 mg/dL — ABNORMAL LOW (ref 8.9–10.3)
Chloride: 109 mmol/L (ref 98–111)
Creatinine, Ser: 0.92 mg/dL (ref 0.44–1.00)
GFR, Estimated: >60 mL/min (ref >60)
Glucose, Bld: 84 mg/dL (ref 70–99)
Potassium: 3.7 mmol/L (ref 3.5–5.1)
Sodium: 142 mmol/L (ref 135–145)
Total Bilirubin: 0.5 mg/dL (ref 0.0–1.2)
Total Protein: 6.6 g/dL (ref 6.5–8.1)

## 2023-10-21 LAB — CBC WITH DIFFERENTIAL/PLATELET
Abs Immature Granulocytes: 0.02 10*3/uL (ref 0.00–0.07)
Basophils Absolute: 0 10*3/uL (ref 0.0–0.1)
Basophils Relative: 1 %
Eosinophils Absolute: 0 10*3/uL (ref 0.0–0.5)
Eosinophils Relative: 1 %
HCT: 38.1 % (ref 36.0–46.0)
Hemoglobin: 12.4 g/dL (ref 12.0–15.0)
Immature Granulocytes: 1 %
Lymphocytes Relative: 53 %
Lymphs Abs: 1.4 10*3/uL (ref 0.7–4.0)
MCH: 27.9 pg (ref 26.0–34.0)
MCHC: 32.5 g/dL (ref 30.0–36.0)
MCV: 85.8 fL (ref 80.0–100.0)
Monocytes Absolute: 0.1 10*3/uL (ref 0.1–1.0)
Monocytes Relative: 5 %
Neutro Abs: 1 10*3/uL — ABNORMAL LOW (ref 1.7–7.7)
Neutrophils Relative %: 39 %
Platelets: 88 10*3/uL — ABNORMAL LOW (ref 150–400)
RBC: 4.44 MIL/uL (ref 3.87–5.11)
RDW: 17.2 % — ABNORMAL HIGH (ref 11.5–15.5)
WBC: 2.6 10*3/uL — ABNORMAL LOW (ref 4.0–10.5)
nRBC: 0 % (ref 0.0–0.2)

## 2023-10-21 LAB — MAGNESIUM: Magnesium: 2 mg/dL (ref 1.7–2.4)

## 2023-10-21 LAB — HEMOGLOBIN A1C
Hgb A1c MFr Bld: 5.9 % — ABNORMAL HIGH (ref 4.8–5.6)
Mean Plasma Glucose: 123 mg/dL

## 2023-10-21 LAB — CARDIOLIPIN ANTIBODIES, IGG, IGM, IGA
Anticardiolipin IgA: <9 U/mL (ref 0–11)
Anticardiolipin IgG: <9 GPL U/mL (ref 0–14)
Anticardiolipin IgM: 30 [MPL'U]/mL — ABNORMAL HIGH (ref 0–12)

## 2023-10-21 LAB — BETA-2-GLYCOPROTEIN I ABS, IGG/M/A
Beta-2 Glyco I IgG: <9 GPI IgG units (ref 0–20)
Beta-2-Glycoprotein I IgA: <9 GPI IgA units (ref 0–25)
Beta-2-Glycoprotein I IgM: <9 GPI IgM units (ref 0–32)

## 2023-10-21 LAB — PHOSPHORUS: Phosphorus: 3.4 mg/dL (ref 2.5–4.6)

## 2023-10-21 LAB — HOMOCYSTEINE: Homocysteine: 12.3 umol/L (ref 0.0–14.5)

## 2023-10-21 NOTE — Progress Notes (Signed)
 TRH ROUNDING  NOTE Christina [name] [id number]  DOB: 08/10/1971  DOA: 10/19/2023  PCP: Pcp, No  10/21/2023,8:03 AM  LOS: 0 days   Code Status: Full  From:  Home   Current Dispo: unclear   53 year old black female previously homeless-lives in motel Known HIV, chronic back pain, previous DVT 11/03/2016 status post mechanical thrombectomy at that time Known marijuana tobacco abuse, bipolar seen previously by psychiatry and admission to [facility] prior, HTN Prior admission for herpetic rash 04/2023 ?  Stroke 06/2023 per patient's report  Spent 3 days in the emergency room between 2/20 and 09/30/2023 awaiting placement-ultimately went to ex sister-in-law's house was transferred to [facility]  10/15/2023 found negative motel oriented X4, found to have less pain droop to mouth and worsening speech-worsening right sided weakness additionally along with dysarthria CT head = small subacute acute strokes involving R frontal R parietal L posterior corpus callosum, MRI brain few subcentimeter acute to subacute ischemic infarcts right frontal temporoparietal lobes and left posterior corpus callosum Sodium 142 potassium 3.6 BUN/creatinine 5/0.9 WBC 2.9 platelet 160 THC positive alcohol level less than 10   Plan  Acute ischemic CVA Neurology recommending ASA 81 Plavix 7521 days then ASA lifelong Echo EF 60-65% RV overload?  Severely elevated PASP small pericardial effusion LDL 102 total cholesterol 811 Outpatient further follow-up  HIV Previously was on Biktarvy /Bactrim not taking-patient resumed on meds CD4 count 2/26 was 38 and will need outpatient routine follow-up with ID Will need outpatient coordination with eithe rPCP or ID  Elevated D-dimer CT angio chest 2/25 no PE despite RV changes No further workup  Depression anxiety Resume Lexapro 10 daily  Impaired glucose tolerance A1c 5.9 Will need patient discussion about meds and further follow-up as an outpatient  No family present, patient  alleges she was staying in a hotel and she has "no one to call"--- social work made aware to please investigate  DVT prophylaxis: Lovenox  Status is: Observation The patient will require care spanning > 2 midnights and should be moved to inpatient because:   Requires further workup  Subjective: Very halting slow speech able to tell me where she is but otherwise delayed responses Tells me he has no one to call  Objective + exam Vitals:   10/20/23 1733 10/20/23 1935 10/21/23 0002 10/21/23 0354  BP: (!) 140/108 (!) 137/100 (!) 133/99 (!) 155/110  Pulse: 83 88 80 72  Resp: 18 17 17 18   Temp: 98 F (36.7 C) 97.9 F (36.6 C) (!) 97.5 F (36.4 C) 98.3 F (36.8 C)  TempSrc: Oral Oral  Oral  SpO2: 100% 100% 100% 100%  Weight:      Height:       Filed Weights   10/19/23 1241  Weight: 54 kg    Examination: EOMI NCAT no focal deficit external ocular movements intact power in upper extremities and lower extremities to major muscle groups 5/5 reflexes 2/3 Edentulous S1-S2 no murmur no rub no gallop Abdomen soft PureWick in place Finger-nose-finger intact bilaterally smile symmetric   Data Reviewed: reviewed   CBC    Component Value Date/Time   WBC 2.6 (L) 10/21/2023 0615   RBC 4.44 10/21/2023 0615   HGB 12.4 10/21/2023 0615   HCT 38.1 10/21/2023 0615   PLT 88 (L) 10/21/2023 0615   MCV 85.8 10/21/2023 0615   MCH 27.9 10/21/2023 0615   MCHC 32.5 10/21/2023 0615   RDW 17.2 (H) 10/21/2023 0615   LYMPHSABS 1.4 10/21/2023 0615   MONOABS 0.1  10/21/2023 0615   EOSABS 0.0 10/21/2023 0615   BASOSABS 0.0 10/21/2023 0615      Latest Ref Rng & Units 10/21/2023    6:15 AM 10/20/2023    4:56 AM 10/19/2023   12:54 PM  CMP  Glucose 70 - 99 mg/dL 84  92  89   BUN 6 - 20 mg/dL 9  9  5    Creatinine 0.44 - 1.00 mg/dL 1.61  0.96  0.45   Sodium 135 - 145 mmol/L 142  143  142   Potassium 3.5 - 5.1 mmol/L 3.7  3.5  3.6   Chloride 98 - 111 mmol/L 109  105  107   CO2 22 - 32 mmol/L 25   25  22    Calcium 8.9 - 10.3 mg/dL 8.1  8.3  8.7   Total Protein 6.5 - 8.1 g/dL 6.6   7.8   Total Bilirubin 0.0 - 1.2 mg/dL 0.5   0.6   Alkaline Phos 38 - 126 U/L 74   87   AST 15 - 41 U/L 26   24   ALT 0 - 44 U/L 17   16     Scheduled Meds:   stroke: early stages of recovery book   Does not apply Once   aspirin EC  81 mg Oral Daily   atorvastatin  40 mg Oral Daily   bictegravir-emtricitabine-tenofovir AF  1 tablet Oral Daily   clopidogrel  75 mg Oral Daily   enoxaparin (LOVENOX) injection  40 mg Subcutaneous Q24H   guaiFENesin  1,200 mg Oral BID   Continuous Infusions:  Time 43  Rhetta Mura, MD  [facility]

## 2023-10-21 NOTE — Evaluation (Signed)
 Speech Language Pathology Evaluation Patient Details Name: Christina [name] MRN: [id number] DOB: 07-06-1971 Today's Date: 10/21/2023 Time: 7829-5621 SLP Time Calculation (min) (ACUTE ONLY): 21 min  Problem List:  Patient Active Problem List   Diagnosis Date Noted   Prolonged QT interval 10/20/2023   Ischemic stroke (HCC) 10/19/2023   Herpes zoster 04/29/2023   Sepsis (HCC) 04/29/2023   HIV (human immunodeficiency virus infection) (HCC) 04/29/2023   Generalized weakness 04/29/2023   Falls 04/29/2023   Hyponatremia 04/29/2023   Hypokalemia 04/29/2023   Cellulitis 04/28/2023   MDD (major depressive disorder), single episode, severe , no psychosis (HCC) 10/21/2018   Edema, peripheral 10/03/2018   MDD (major depressive disorder), recurrent episode, severe (HCC) 09/22/2018   Lumbar herniated disc 09/30/2017   Hypertension 05/22/2017   Migraine without aura and with status migrainosus, not intractable 05/22/2017   Tachycardia 05/22/2017   Insomnia 03/04/2017   Chronic bilateral low back pain with bilateral sciatica 01/26/2017   Depression with anxiety 01/26/2017   DVT (deep venous thrombosis) (HCC) 11/03/2016   Past Medical History:  Past Medical History:  Diagnosis Date   Anxiety    Back pain    DVT (deep venous thrombosis) (HCC)    left leg with stent   Past Surgical History:  Past Surgical History:  Procedure Laterality Date   CORONARY ULTRASOUND/IVUS Left 11/03/2016   Procedure: Intravascular Ultrasound/IVUS;  Surgeon: Maeola Harman, MD;  Location: [facility] INVASIVE CV LAB;  Service: Cardiovascular;  Laterality: Left;   LOWER EXTREMITY VENOGRAPHY Left 11/04/2016   Procedure: Lower Extremity Venography;  Surgeon: Nada Libman, MD;  Location: [facility] INVASIVE CV LAB;  Service: Cardiovascular;  Laterality: Left;   PERIPHERAL VASCULAR INTERVENTION Left 11/04/2016   Procedure: Peripheral Vascular Intervention;  Surgeon: Nada Libman, MD;  Location: [facility] INVASIVE CV LAB;   Service: Cardiovascular;  Laterality: Left;  common external   PERIPHERAL VASCULAR THROMBECTOMY Left 11/03/2016   Procedure: Peripheral Vascular Thrombectomy;  Surgeon: Maeola Harman, MD;  Location: [facility] INVASIVE CV LAB;  Service: Cardiovascular;  Laterality: Left;   HPI:  53 y.o. female admitted 2/24 with Rt sided weakness for 3-4 weeks. MRI brain: scattered subcentimeter acute to early subacute ischemic infarctions involving Rt frontal, Rt parietal lobe, Lt posterior corpus callosum. PMhx: HIV, depression, anxiety, DVT, and substance abuse   Assessment / Plan / Recommendation Clinical Impression  Pt presents with impairments in the area of speech intelligibility, memory, functional problem solving, and divergent naming. Her dysarthria fluctuates from mild-moderate in conversation marked by increased rate and decreased articulatory precision. She has good awareness of her speech and cognitive impairments stating that "her speech is not good and she doesn't like the way it sounds" and her thinking is "slow". Her verbal problem solving was better than her functional as she had difficulty placing hands on clock for correct time (not due to decreased manual dexterity). She exhibited storage and retrieval deficits and recalled 1 of 4 words although functionally able to recall event noted earlier in session. She could name only 5 animals in one minute. Several times she exhibited mild lability. Pt would benefit from continued ST on acute and at rehab requiring < 3 hours a day.    SLP Assessment  SLP Recommendation/Assessment: Patient needs continued Speech Lanaguage Pathology Services SLP Visit Diagnosis: Cognitive communication deficit (R41.841);Dysarthria and anarthria (R47.1)    Recommendations for follow up therapy are one component of a multi-disciplinary discharge planning process, led by the attending physician.  Recommendations may be updated based on  patient status, additional functional  criteria and insurance authorization.    Follow Up Recommendations  Skilled nursing-short term rehab (<3 hours/day)    Assistance Recommended at Discharge  Intermittent Supervision/Assistance  Functional Status Assessment Patient has had a recent decline in their functional status and demonstrates the ability to make significant improvements in function in a reasonable and predictable amount of time.  Frequency and Duration min 2x/week  2 weeks      SLP Evaluation Cognition  Overall Cognitive Status: Impaired/Different from baseline Arousal/Alertness: Awake/alert Orientation Level: Oriented to person;Oriented to place;Oriented to situation;Oriented to time (only missed the date by 1 day) Year: 2025 Month: February Day of Week: Incorrect Attention: Sustained Sustained Attention: Appears intact Memory: Impaired Memory Impairment: Retrieval deficit;Storage deficit (recalled 1 of 4 words) Awareness: Appears intact Problem Solving: Impaired Problem Solving Impairment: Functional basic Safety/Judgment: Appears intact       Comprehension  Auditory Comprehension Overall Auditory Comprehension: Appears within functional limits for tasks assessed Commands: Within Functional Limits (1 and 2 step) Conversation: Simple Visual Recognition/Discrimination Discrimination: Not tested Reading Comprehension Reading Status: Not tested    Expression Expression Primary Mode of Expression: Verbal Verbal Expression Overall Verbal Expression: Appears within functional limits for tasks assessed Initiation: No impairment Level of Generative/Spontaneous Verbalization: Conversation Repetition:  (NT) Naming: Impairment Divergent:  (named 5 animals in one minute) Pragmatics: No impairment Non-Verbal Means of Communication: Not applicable Written Expression Dominant Hand: Right Written Expression:  (wrote name correctly)   Oral / Motor  Oral Motor/Sensory Function Overall Oral Motor/Sensory  Function: Mild impairment Facial ROM: Reduced right;Suspected CN VII (facial) dysfunction Facial Symmetry: Abnormal symmetry right;Suspected CN VII (facial) dysfunction Facial Sensation: Reduced right;Suspected CN V (Trigeminal) dysfunction Lingual ROM: Within Functional Limits Lingual Symmetry: Within Functional Limits Motor Speech Overall Motor Speech: Impaired Respiration: Within functional limits Phonation: Normal Resonance: Within functional limits Articulation: Impaired Level of Impairment: Phrase Intelligibility: Intelligibility reduced Word: 75-100% accurate Phrase: 75-100% accurate Sentence: 50-74% accurate Conversation: 50-74% accurate Motor Planning: Witnin functional limits Motor Speech Errors: Not applicable            Royce Macadamia 10/21/2023, 11:48 AM

## 2023-10-22 DIAGNOSIS — R29706 NIHSS score 6: Secondary | ICD-10-CM | POA: Diagnosis not present

## 2023-10-22 DIAGNOSIS — D696 Thrombocytopenia, unspecified: Secondary | ICD-10-CM | POA: Diagnosis not present

## 2023-10-22 DIAGNOSIS — Z7902 Long term (current) use of antithrombotics/antiplatelets: Secondary | ICD-10-CM | POA: Diagnosis not present

## 2023-10-22 DIAGNOSIS — I639 Cerebral infarction, unspecified: Secondary | ICD-10-CM | POA: Diagnosis present

## 2023-10-22 DIAGNOSIS — F1210 Cannabis abuse, uncomplicated: Secondary | ICD-10-CM | POA: Diagnosis present

## 2023-10-22 DIAGNOSIS — R29703 NIHSS score 3: Secondary | ICD-10-CM | POA: Diagnosis present

## 2023-10-22 DIAGNOSIS — I10 Essential (primary) hypertension: Secondary | ICD-10-CM | POA: Diagnosis present

## 2023-10-22 DIAGNOSIS — E785 Hyperlipidemia, unspecified: Secondary | ICD-10-CM | POA: Diagnosis present

## 2023-10-22 DIAGNOSIS — Z5901 Sheltered homelessness: Secondary | ICD-10-CM | POA: Diagnosis not present

## 2023-10-22 DIAGNOSIS — B20 Human immunodeficiency virus [HIV] disease: Secondary | ICD-10-CM | POA: Diagnosis present

## 2023-10-22 DIAGNOSIS — I3139 Other pericardial effusion (noninflammatory): Secondary | ICD-10-CM | POA: Diagnosis present

## 2023-10-22 DIAGNOSIS — Z7982 Long term (current) use of aspirin: Secondary | ICD-10-CM | POA: Diagnosis not present

## 2023-10-22 DIAGNOSIS — R270 Ataxia, unspecified: Secondary | ICD-10-CM | POA: Diagnosis present

## 2023-10-22 DIAGNOSIS — Z79899 Other long term (current) drug therapy: Secondary | ICD-10-CM | POA: Diagnosis not present

## 2023-10-22 DIAGNOSIS — Z86718 Personal history of other venous thrombosis and embolism: Secondary | ICD-10-CM | POA: Diagnosis not present

## 2023-10-22 DIAGNOSIS — R7302 Impaired glucose tolerance (oral): Secondary | ICD-10-CM | POA: Diagnosis present

## 2023-10-22 DIAGNOSIS — R471 Dysarthria and anarthria: Secondary | ICD-10-CM | POA: Diagnosis present

## 2023-10-22 DIAGNOSIS — I6381 Other cerebral infarction due to occlusion or stenosis of small artery: Secondary | ICD-10-CM | POA: Diagnosis present

## 2023-10-22 DIAGNOSIS — D72819 Decreased white blood cell count, unspecified: Secondary | ICD-10-CM | POA: Diagnosis present

## 2023-10-22 DIAGNOSIS — Z91148 Patient's other noncompliance with medication regimen for other reason: Secondary | ICD-10-CM | POA: Diagnosis not present

## 2023-10-22 DIAGNOSIS — E8809 Other disorders of plasma-protein metabolism, not elsewhere classified: Secondary | ICD-10-CM | POA: Diagnosis present

## 2023-10-22 DIAGNOSIS — F32A Depression, unspecified: Secondary | ICD-10-CM | POA: Diagnosis present

## 2023-10-22 DIAGNOSIS — F419 Anxiety disorder, unspecified: Secondary | ICD-10-CM | POA: Diagnosis present

## 2023-10-22 DIAGNOSIS — F17210 Nicotine dependence, cigarettes, uncomplicated: Secondary | ICD-10-CM | POA: Diagnosis present

## 2023-10-22 DIAGNOSIS — G8191 Hemiplegia, unspecified affecting right dominant side: Secondary | ICD-10-CM | POA: Diagnosis present

## 2023-10-22 LAB — CBC WITH DIFFERENTIAL/PLATELET
Abs Immature Granulocytes: 0.01 10*3/uL (ref 0.00–0.07)
Basophils Absolute: 0 10*3/uL (ref 0.0–0.1)
Basophils Relative: 0 %
Eosinophils Absolute: 0.1 10*3/uL (ref 0.0–0.5)
Eosinophils Relative: 2 %
HCT: 37.3 % (ref 36.0–46.0)
Hemoglobin: 12 g/dL (ref 12.0–15.0)
Immature Granulocytes: 0 %
Lymphocytes Relative: 51 %
Lymphs Abs: 1.6 10*3/uL (ref 0.7–4.0)
MCH: 27.6 pg (ref 26.0–34.0)
MCHC: 32.2 g/dL (ref 30.0–36.0)
MCV: 85.7 fL (ref 80.0–100.0)
Monocytes Absolute: 0.2 10*3/uL (ref 0.1–1.0)
Monocytes Relative: 6 %
Neutro Abs: 1.3 10*3/uL — ABNORMAL LOW (ref 1.7–7.7)
Neutrophils Relative %: 41 %
Platelets: 81 10*3/uL — ABNORMAL LOW (ref 150–400)
RBC: 4.35 MIL/uL (ref 3.87–5.11)
RDW: 17.2 % — ABNORMAL HIGH (ref 11.5–15.5)
WBC: 3.1 10*3/uL — ABNORMAL LOW (ref 4.0–10.5)
nRBC: 0 % (ref 0.0–0.2)

## 2023-10-22 LAB — BASIC METABOLIC PANEL
Anion gap: 5 (ref 5–15)
BUN: 7 mg/dL (ref 6–20)
CO2: 26 mmol/L (ref 22–32)
Calcium: 8.2 mg/dL — ABNORMAL LOW (ref 8.9–10.3)
Chloride: 110 mmol/L (ref 98–111)
Creatinine, Ser: 0.85 mg/dL (ref 0.44–1.00)
GFR, Estimated: >60 mL/min (ref >60)
Glucose, Bld: 93 mg/dL (ref 70–99)
Potassium: 3.6 mmol/L (ref 3.5–5.1)
Sodium: 141 mmol/L (ref 135–145)

## 2023-10-22 LAB — HEXAGONAL PHASE PHOSPHOLIPID: Hexagonal Phase Phospholipid: 7 s (ref 0–11)

## 2023-10-22 LAB — PROTEIN C ACTIVITY: Protein C Activity: 88 % (ref 73–180)

## 2023-10-22 LAB — PROTEIN S ACTIVITY: Protein S Activity: 22 % — ABNORMAL LOW (ref 63–140)

## 2023-10-22 LAB — PTT-LA MIX: PTT-LA Mix: 46 s — ABNORMAL HIGH (ref 0.0–40.5)

## 2023-10-22 LAB — LUPUS ANTICOAGULANT PANEL
DRVVT: 36.1 s (ref 0.0–47.0)
PTT Lupus Anticoagulant: 52.2 s — ABNORMAL HIGH (ref 0.0–43.5)

## 2023-10-22 LAB — PROTEIN S, TOTAL: Protein S Ag, Total: 38 % — ABNORMAL LOW (ref 60–150)

## 2023-10-22 LAB — HIGH SENSITIVITY CRP: CRP, High Sensitivity: 0.62 mg/L (ref 0.00–3.00)

## 2023-10-22 LAB — HIV-1 RNA QUANT-NO REFLEX-BLD
HIV 1 RNA Quant: 367000 {copies}/mL
LOG10 HIV-1 RNA: 5.565 {Log}

## 2023-10-22 LAB — PROTEIN C, TOTAL: Protein C, Total: 81 % (ref 60–150)

## 2023-10-22 MED ORDER — SULFAMETHOXAZOLE-TRIMETHOPRIM 400-80 MG PO TABS
1.0000 | ORAL_TABLET | Freq: Every day | ORAL | Status: DC
  Administered 2023-10-22 – 2023-10-24 (×3): 1 via ORAL
  Filled 2023-10-22 (×3): qty 1

## 2023-10-22 NOTE — Progress Notes (Signed)
 Physical Therapy Treatment Patient Details Name: Christina [name] MRN: [id number] DOB: Dec 17, 1970 Today's Date: 10/22/2023   History of Present Illness 53 y.o. female admitted 2/24 with Rt sided weakness for 3-4 weeks. MRI brain: scattered subcentimeter acute to early subacute ischemic infarctions involving Rt frontal, Rt parietal lobe, Lt posterior corpus callosum. PMhx: HIV, depression, anxiety, DVT, and substance abuse    PT Comments  Pt resting in bed on arrival and agreeable to session with continued progress towards acute goals. Pt continues to be limited in safe mobility by R inattention, RLE weakness, decreased activity tolerance and decreased insight into deficits. Pt able to progress gait tolerance with RW for support and min A to maintain balance with hands on assist to advance RLE. Pt with no noted buckling on R and no overt LOB throughout. Current plan remains appropriate to address deficits and maximize functional independence and decrease caregiver burden. Pt continues to benefit from skilled PT services to progress toward functional mobility goals.      If plan is discharge home, recommend the following: Assistance with cooking/housework;Help with stairs or ramp for entrance;A little help with bathing/dressing/bathroom;A little help with walking and/or transfers   Can travel by private vehicle     Yes  Equipment Recommendations  None recommended by PT    Recommendations for Other Services       Precautions / Restrictions Precautions Precautions: Fall;Other (comment) Recall of Precautions/Restrictions: Impaired Precaution/Restrictions Comments: permissive HTN, right foot drag Restrictions Weight Bearing Restrictions Per Provider Order: No     Mobility  Bed Mobility Overal bed mobility: Needs Assistance Bed Mobility: Supine to Sit     Supine to sit: Contact guard     General bed mobility comments: pt pivoting to EOB with CGA for safety and increased time, some  cues for initiation and seqencing    Transfers Overall transfer level: Needs assistance Equipment used: Rolling walker (2 wheels) Transfers: Sit to/from Stand Sit to Stand: Contact guard assist, Min assist           General transfer comment: cue for hand placement, light min A from low commode    Ambulation/Gait Ambulation/Gait assistance: Min assist Gait Distance (Feet): 75 Feet Assistive device: Rolling walker (2 wheels) Gait Pattern/deviations: Step-to pattern, Trunk flexed Gait velocity: decreased     General Gait Details: noted R inattetion with max cues to avoid obstacels on R, assist to advance RLE as pt with poor hip/knee flexion and poor active DF   Stairs             Wheelchair Mobility     Tilt Bed    Modified Rankin (Stroke Patients Only)       Balance Overall balance assessment: Needs assistance, History of Falls Sitting-balance support: No upper extremity supported, Feet supported Sitting balance-Leahy Scale: Fair Sitting balance - Comments: Pt leaning to R and L in slumped posture.  She did hold briefly for about 30 seconds without assist but otherwise min-mod A for 12 mins at EOB   Standing balance support: Reliant on assistive device for balance, Bilateral upper extremity supported, During functional activity Standing balance-Leahy Scale: Poor Standing balance comment: RW in standing                            Communication Communication Communication: Impaired Factors Affecting Communication: Reduced clarity of speech;Difficulty expressing self  Cognition Arousal: Alert Behavior During Therapy: Flat affect   PT - Cognitive impairments: No family/caregiver present to  determine baseline                       PT - Cognition Comments: pt oriented but decreased safety awareness letting go of RW to reach for rail and sink slow processing, perseverating on wanting a shower Following commands: Impaired Following  commands impaired: Follows one step commands with increased time    Cueing Cueing Techniques: Verbal cues  Exercises      General Comments        Pertinent Vitals/Pain Pain Assessment Pain Assessment: No/denies pain Pain Intervention(s): Monitored during session    Home Living                          Prior Function            PT Goals (current goals can now be found in the care plan section) Acute Rehab PT Goals Patient Stated Goal: find a group home to go to PT Goal Formulation: With patient Time For Goal Achievement: 11/03/23 Progress towards PT goals: Progressing toward goals    Frequency    Min 1X/week      PT Plan      Co-evaluation              AM-PAC PT "6 Clicks" Mobility   Outcome Measure  Help needed turning from your back to your side while in a flat bed without using bedrails?: None Help needed moving from lying on your back to sitting on the side of a flat bed without using bedrails?: A Little Help needed moving to and from a bed to a chair (including a wheelchair)?: A Little Help needed standing up from a chair using your arms (e.g., wheelchair or bedside chair)?: A Little Help needed to walk in hospital room?: A Little Help needed climbing 3-5 steps with a railing? : A Lot 6 Click Score: 18    End of Session Equipment Utilized During Treatment: Gait belt Activity Tolerance: Patient tolerated treatment well Patient left: with call bell/phone within reach;in chair;with chair alarm set Nurse Communication: Mobility status PT Visit Diagnosis: Other abnormalities of gait and mobility (R26.89);Muscle weakness (generalized) (M62.81)     Time: 1610-9604 PT Time Calculation (min) (ACUTE ONLY): 30 min  Charges:    $Gait Training: 8-22 mins $Therapeutic Activity: 8-22 mins PT General Charges $$ ACUTE PT VISIT: 1 Visit                     Christie Copley R. PTA Acute Rehabilitation Services Office: [phone]   Catalina Antigua 10/22/2023, 12:56 PM

## 2023-10-22 NOTE — Progress Notes (Signed)
 TRH ROUNDING  NOTE Christina [name] WUJ:[id number]  DOB: [birth year]  DOA: 10/19/2023  PCP: Pcp, No  10/22/2023,12:42 PM  LOS: 0 days   Code Status: Full  From:  Home   Current Dispo: unclear   53 year old black female previously homeless-lives in motel Known HIV, chronic back pain, previous DVT 11/03/2016 status post mechanical thrombectomy at that time Known marijuana tobacco abuse, bipolar seen previously by psychiatry and admission to [facility] prior, HTN Prior admission for herpetic rash 04/2023 ?  Stroke 06/2023 per patient's report  Spent 3 days in the emergency room between 2/20 and 09/30/2023 awaiting placement-ultimately went to ex sister-in-law's house was transferred to [facility]  10/15/2023 found negative motel oriented X4, found to have less pain droop to mouth and worsening speech-worsening right sided weakness additionally along with dysarthria CT head = small subacute acute strokes involving R frontal R parietal L posterior corpus callosum, MRI brain few subcentimeter acute to subacute ischemic infarcts right frontal temporoparietal lobes and left posterior corpus callosum Sodium 142 potassium 3.6 BUN/creatinine 5/0.9 WBC 2.9 platelet 160 THC positive alcohol level less than 10   Plan  Acute ischemic CVA Neurology recommending ASA 81 Plavix 75 x till 3/16 [21 d] then ASA lifelong Echo EF 60-65% RV overload?  Severely elevated PASP small pericardial effusion LDL 102 total cholesterol 956 She has dense dysarthria and slow processing of speech and cognition is somewhat affected as well and there is some lability  HIV Previously was on Biktarvy /Bactrim not taking-patient resumed on meds CD4 count 2/26 was 38 and will need outpatient routine follow-up with ID outpatient coordination with either PCP or ID  Elevated D-dimer CT angio chest 2/25 no PE despite RV changes No further workup-Will need outpatient follow-up of echo as above in several months  Depression anxiety Resume  Lexapro 10 daily  Impaired glucose tolerance A1c 5.9 Will need patient discussion about meds and further follow-up as an outpatient  No family present-social worker to investigate further disposition planning as she clearly requires some level of skilled care  DVT prophylaxis: Lovenox  Status is: Observation The patient will require care spanning > 2 midnights and should be moved to inpatient because:   Requires further workup  Subjective:  Patient apparently walked with physical therapy earlier today although unable to verify and waiting on their notes She has no specific other complaints at this time She ate breakfast in 2 sittings   Objective + exam Vitals:   10/21/23 1958 10/21/23 2354 10/22/23 0420 10/22/23 0807  BP: (!) 149/110 (!) 125/106 (!) 137/110 (!) 140/107  Pulse: 85 85 73 85  Resp: 17 18 17 17   Temp: (!) 97.5 F (36.4 C) 97.8 F (36.6 C) 97.8 F (36.6 C) 98.2 F (36.8 C)  TempSrc:    Oral  SpO2: 100% 100% 99% 100%  Weight:      Height:       Filed Weights   10/19/23 1241  Weight: 54 kg    Examination:  No icterus no pallor dysarthric speech with some slurring Drooping of mouth to the right Power 5/5 upper lower extremities reflexes deferred Grossly no large changes to neuroexam S1-S2 no murmur sinus rhythm on monitors Abdomen soft no rebound   Data Reviewed: reviewed   CBC    Component Value Date/Time   WBC 3.1 (L) 10/22/2023 0642   RBC 4.35 10/22/2023 0642   HGB 12.0 10/22/2023 0642   HCT 37.3 10/22/2023 0642   PLT 81 (L) 10/22/2023 2130  MCV 85.7 10/22/2023 0642   MCH 27.6 10/22/2023 0642   MCHC 32.2 10/22/2023 0642   RDW 17.2 (H) 10/22/2023 0642   LYMPHSABS 1.6 10/22/2023 0642   MONOABS 0.2 10/22/2023 0642   EOSABS 0.1 10/22/2023 0642   BASOSABS 0.0 10/22/2023 0642      Latest Ref Rng & Units 10/22/2023    6:42 AM 10/21/2023    6:15 AM 10/20/2023    4:56 AM  CMP  Glucose 70 - 99 mg/dL 93  84  92   BUN 6 - 20 mg/dL 7  9  9     Creatinine 0.44 - 1.00 mg/dL 1.61  0.96  0.45   Sodium 135 - 145 mmol/L 141  142  143   Potassium 3.5 - 5.1 mmol/L 3.6  3.7  3.5   Chloride 98 - 111 mmol/L 110  109  105   CO2 22 - 32 mmol/L 26  25  25    Calcium 8.9 - 10.3 mg/dL 8.2  8.1  8.3   Total Protein 6.5 - 8.1 g/dL  6.6    Total Bilirubin 0.0 - 1.2 mg/dL  0.5    Alkaline Phos 38 - 126 U/L  74    AST 15 - 41 U/L  26    ALT 0 - 44 U/L  17      Scheduled Meds:  aspirin EC  81 mg Oral Daily   atorvastatin  40 mg Oral Daily   bictegravir-emtricitabine-tenofovir AF  1 tablet Oral Daily   clopidogrel  75 mg Oral Daily   enoxaparin (LOVENOX) injection  40 mg Subcutaneous Q24H   guaiFENesin  1,200 mg Oral BID   sulfamethoxazole-trimethoprim  1 tablet Oral Daily   Continuous Infusions:  Time 43  Rhetta Mura, MD  [facility]

## 2023-10-22 NOTE — Progress Notes (Signed)
 OT Cancellation Note  Patient Details Name: Christina [name] MRN: [id number] DOB: 08/09/1971   Cancelled Treatment:    Reason Eval/Treat Not Completed: Patient declined, no reason specified (Pt declined OT at this time. Reports being too tired and would like OT to return at another time. OT will follow-up with pt as able.)   10/22/2023  AB, OTR/L  Acute Rehabilitation Services  Office: [phone]  Tristan Schroeder 10/22/2023, 3:42 PM

## 2023-10-23 DIAGNOSIS — I639 Cerebral infarction, unspecified: Secondary | ICD-10-CM | POA: Diagnosis not present

## 2023-10-23 LAB — CBC WITH DIFFERENTIAL/PLATELET
Abs Immature Granulocytes: 0.02 10*3/uL (ref 0.00–0.07)
Basophils Absolute: 0 10*3/uL (ref 0.0–0.1)
Basophils Relative: 1 %
Eosinophils Absolute: 0.1 10*3/uL (ref 0.0–0.5)
Eosinophils Relative: 1 %
HCT: 38.2 % (ref 36.0–46.0)
Hemoglobin: 12.3 g/dL (ref 12.0–15.0)
Immature Granulocytes: 1 %
Lymphocytes Relative: 44 %
Lymphs Abs: 1.8 10*3/uL (ref 0.7–4.0)
MCH: 27.8 pg (ref 26.0–34.0)
MCHC: 32.2 g/dL (ref 30.0–36.0)
MCV: 86.2 fL (ref 80.0–100.0)
Monocytes Absolute: 0.2 10*3/uL (ref 0.1–1.0)
Monocytes Relative: 5 %
Neutro Abs: 2 10*3/uL (ref 1.7–7.7)
Neutrophils Relative %: 48 %
Platelets: 74 10*3/uL — ABNORMAL LOW (ref 150–400)
RBC: 4.43 MIL/uL (ref 3.87–5.11)
RDW: 17.3 % — ABNORMAL HIGH (ref 11.5–15.5)
WBC: 4 10*3/uL (ref 4.0–10.5)
nRBC: 0 % (ref 0.0–0.2)

## 2023-10-23 LAB — BASIC METABOLIC PANEL
Anion gap: 8 (ref 5–15)
BUN: 10 mg/dL (ref 6–20)
CO2: 23 mmol/L (ref 22–32)
Calcium: 8.1 mg/dL — ABNORMAL LOW (ref 8.9–10.3)
Chloride: 111 mmol/L (ref 98–111)
Creatinine, Ser: 0.83 mg/dL (ref 0.44–1.00)
GFR, Estimated: >60 mL/min (ref >60)
Glucose, Bld: 97 mg/dL (ref 70–99)
Potassium: 3.9 mmol/L (ref 3.5–5.1)
Sodium: 142 mmol/L (ref 135–145)

## 2023-10-23 LAB — PROTHROMBIN GENE MUTATION

## 2023-10-23 NOTE — Progress Notes (Signed)
 TRH ROUNDING  NOTE Christina [name] ZOX:[id number]  DOB: 11-01-1970  DOA: 10/19/2023  PCP: Pcp, No  10/23/2023,10:28 AM  LOS: 1 day   Code Status: Full  From:  Home   Current Dispo: unclear   53 year old black female previously homeless-lives in motel Known HIV, chronic back pain, previous DVT 11/03/2016 status post mechanical thrombectomy at that time Known marijuana tobacco abuse, bipolar seen previously by psychiatry and admission to [facility] prior, HTN Prior admission for herpetic rash 04/2023 ?  Stroke 06/2023 per patient's report  Spent 3 days in the emergency room between 2/20 and 09/30/2023 awaiting placement-ultimately went to ex sister-in-law's house was transferred to [facility]  10/15/2023 found negative motel oriented X4, found to have less pain droop to mouth and worsening speech-worsening right sided weakness additionally along with dysarthria CT head = small subacute acute strokes involving R frontal R parietal L posterior corpus callosum, MRI brain few subcentimeter acute to subacute ischemic infarcts right frontal temporoparietal lobes and left posterior corpus callosum Sodium 142 potassium 3.6 BUN/creatinine 5/0.9 WBC 2.9 platelet 160 THC positive alcohol level less than 10   Plan  Acute ischemic CVA Neurology recommending ASA 81 Plavix 75 x till 3/16 [21 d] then ASA lifelong Echo EF 60-65% RV overload?  Severely elevated PASP small pericardial effusion LDL 102 total cholesterol 811 Has been ambulating with moderate assistance with therapy needs standby assistance as is unsteady Social work is looking into getting her assistance as she is unsafe to discharge without assistance given her deficits and her homeless state  HIV Previously was on [place] /Bactrim not taking-patient resumed on meds CD4 count 2/26 was 38 and will need outpatient routine follow-up with ID as an outpatient I have reached out to ID to ensure that we have a follow-up set up  Elevated D-dimer CT angio  chest 2/25 no PE despite RV changes No further workup-Will need outpatient follow-up of echo once again follow-up with primary care  Depression anxiety Resume Lexapro 10 daily  Impaired glucose tolerance A1c 5.9 Will need patient discussion about meds and further follow-up as an outpatient   DVT prophylaxis: Lovenox  Status is: Observation The patient will require care spanning > 2 midnights and should be moved to inpatient because:   Requires further workup  Subjective:  Patient seen/evaluated walking in hallway with walker and slow steady steps gait is reasonable She is verbalizing still very slowly and needs time processing still dysarthric  Objective + exam Vitals:   10/22/23 2012 10/22/23 2338 10/23/23 0419 10/23/23 0910  BP: (!) 136/99 (!) 141/98 (!) 135/96 (!) 135/90  Pulse: 79 80 73 88  Resp: 16 16 16 17   Temp: 98.1 F (36.7 C) 97.7 F (36.5 C) (!) 97.5 F (36.4 C) 98 F (36.7 C)  TempSrc: Oral Oral Oral Oral  SpO2: 100% 100% 100% 100%  Weight:      Height:       Filed Weights   10/19/23 1241  Weight: 54 kg    Examination:  Still has dysarthria Chest is clear no wheeze S1-S2 no murmur Full exam deferred given patient's activity ambulating in hallway-full exam tomorrow   Data Reviewed: reviewed   CBC    Component Value Date/Time   WBC 4.0 10/23/2023 0717   RBC 4.43 10/23/2023 0717   HGB 12.3 10/23/2023 0717   HCT 38.2 10/23/2023 0717   PLT 74 (L) 10/23/2023 0717   MCV 86.2 10/23/2023 0717   MCH 27.8 10/23/2023 0717   MCHC 32.2 10/23/2023  0717   RDW 17.3 (H) 10/23/2023 0717   LYMPHSABS 1.8 10/23/2023 0717   MONOABS 0.2 10/23/2023 0717   EOSABS 0.1 10/23/2023 0717   BASOSABS 0.0 10/23/2023 0717      Latest Ref Rng & Units 10/23/2023    7:17 AM 10/22/2023    6:42 AM 10/21/2023    6:15 AM  CMP  Glucose 70 - 99 mg/dL 97  93  84   BUN 6 - 20 mg/dL 10  7  9    Creatinine 0.44 - 1.00 mg/dL 1.61  0.96  0.45   Sodium 135 - 145 mmol/L 142  141   142   Potassium 3.5 - 5.1 mmol/L 3.9  3.6  3.7   Chloride 98 - 111 mmol/L 111  110  109   CO2 22 - 32 mmol/L 23  26  25    Calcium 8.9 - 10.3 mg/dL 8.1  8.2  8.1   Total Protein 6.5 - 8.1 g/dL   6.6   Total Bilirubin 0.0 - 1.2 mg/dL   0.5   Alkaline Phos 38 - 126 U/L   74   AST 15 - 41 U/L   26   ALT 0 - 44 U/L   17     Scheduled Meds:  aspirin EC  81 mg Oral Daily   atorvastatin  40 mg Oral Daily   bictegravir-emtricitabine-tenofovir AF  1 tablet Oral Daily   clopidogrel  75 mg Oral Daily   enoxaparin (LOVENOX) injection  40 mg Subcutaneous Q24H   guaiFENesin  1,200 mg Oral BID   sulfamethoxazole-trimethoprim  1 tablet Oral Daily   Continuous Infusions:  Time 33  Rhetta Mura, MD  [facility]

## 2023-10-23 NOTE — Progress Notes (Signed)
 Christina [name] [phone] [facility] and [employer] social worker came by to discuss SNF with patient who refused to talk to her.

## 2023-10-23 NOTE — Plan of Care (Signed)
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
## 2023-10-23 NOTE — Progress Notes (Signed)
 Physical Therapy Treatment Patient Details Name: Christina [name] MRN: [id number] DOB: 15-Sep-1970 Today's Date: 10/23/2023   History of Present Illness 53 y.o. female admitted 2/24 with Rt sided weakness for 3-4 weeks. MRI brain: scattered subcentimeter acute to early subacute ischemic infarctions involving Rt frontal, Rt parietal lobe, Lt posterior corpus callosum. PMhx: HIV, depression, anxiety, DVT, and substance abuse    PT Comments  Pt resting in bed on arrival and agreeable to session with continued progress towards acute goals. Pt continues to need max cues for safety as pt with decreased insight into deficits, memory deficits and slow processing placing pt at increased risk for falls. Physically pt requiring grossly CGA for bed mobility and transfers with increased time needed for motor planning and initiation. Pt requiring up to min A to steady during gait with RW for support and hand son assist for RLE clearance as pt sliding foot along floor with fatigue. Pt continues to benefit from skilled PT services to progress toward functional mobility goals.     If plan is discharge home, recommend the following: Assistance with cooking/housework;Help with stairs or ramp for entrance;A little help with bathing/dressing/bathroom;A little help with walking and/or transfers   Can travel by private vehicle     Yes  Equipment Recommendations  None recommended by PT    Recommendations for Other Services       Precautions / Restrictions Precautions Precautions: Fall;Other (comment) Recall of Precautions/Restrictions: Impaired Precaution/Restrictions Comments: permissive HTN, right foot drag Restrictions Weight Bearing Restrictions Per Provider Order: No     Mobility  Bed Mobility Overal bed mobility: Needs Assistance Bed Mobility: Supine to Sit     Supine to sit: Contact guard     General bed mobility comments: pt pivoting to EOB with CGA for safety and increased time, some cues for  initiation and seqencing    Transfers Overall transfer level: Needs assistance Equipment used: Rolling walker (2 wheels) Transfers: Sit to/from Stand Sit to Stand: Contact guard assist           General transfer comment: cue for hand placement    Ambulation/Gait Ambulation/Gait assistance: Min assist, Contact guard assist Gait Distance (Feet): 80 Feet Assistive device: Rolling walker (2 wheels) Gait Pattern/deviations: Step-to pattern, Trunk flexed Gait velocity: decreased     General Gait Details: min A to steady with fatigue, variable RLE clearance, max cues to maintain closer proximity to [employer]     Tilt Bed    Modified Rankin (Stroke Patients Only)       Balance Overall balance assessment: Needs assistance, History of Falls Sitting-balance support: No upper extremity supported, Feet supported Sitting balance-Leahy Scale: Fair Sitting balance - Comments: Pt leaning to R and L in slumped posture.  She did hold briefly for about 30 seconds without assist but otherwise min-mod A for 12 mins at EOB   Standing balance support: Reliant on assistive device for balance, Bilateral upper extremity supported, During functional activity Standing balance-Leahy Scale: Poor Standing balance comment: RW in standing                            Communication Communication Communication: Impaired Factors Affecting Communication: Reduced clarity of speech;Difficulty expressing self  Cognition Arousal: Alert Behavior During Therapy: Flat affect   PT - Cognitive impairments: No family/caregiver present to determine baseline  PT - Cognition Comments: pt oriented but decreased safety awareness letting go of RW to reach for rail and sink, slow processing and easily distractible needing increased cues to attend to task Following commands: Impaired Following commands impaired: Follows one step  commands with increased time    Cueing Cueing Techniques: Verbal cues  Exercises      General Comments        Pertinent Vitals/Pain Pain Assessment Pain Assessment: No/denies pain Pain Intervention(s): Monitored during session    Home Living                          Prior Function            PT Goals (current goals can now be found in the care plan section) Acute Rehab PT Goals Patient Stated Goal: find a group home to go to PT Goal Formulation: With patient Time For Goal Achievement: 11/03/23 Progress towards PT goals: Progressing toward goals    Frequency    Min 1X/week      PT Plan      Co-evaluation              AM-PAC PT "6 Clicks" Mobility   Outcome Measure  Help needed turning from your back to your side while in a flat bed without using bedrails?: None Help needed moving from lying on your back to sitting on the side of a flat bed without using bedrails?: A Little Help needed moving to and from a bed to a chair (including a wheelchair)?: A Little Help needed standing up from a chair using your arms (e.g., wheelchair or bedside chair)?: A Little Help needed to walk in hospital room?: A Little Help needed climbing 3-5 steps with a railing? : A Lot 6 Click Score: 18    End of Session Equipment Utilized During Treatment: Gait belt Activity Tolerance: Patient tolerated treatment well Patient left: with call bell/phone within reach;in chair;with chair alarm set Nurse Communication: Mobility status PT Visit Diagnosis: Other abnormalities of gait and mobility (R26.89);Muscle weakness (generalized) (M62.81)     Time: 4332-9518 PT Time Calculation (min) (ACUTE ONLY): 28 min  Charges:    $Gait Training: 23-37 mins PT General Charges $$ ACUTE PT VISIT: 1 Visit                     Christina [name] R. PTA Acute Rehabilitation Services Office: [phone]   Catalina Antigua 10/23/2023, 10:56 AM

## 2023-10-24 ENCOUNTER — Other Ambulatory Visit (HOSPITAL_COMMUNITY): Payer: Self-pay

## 2023-10-24 DIAGNOSIS — I639 Cerebral infarction, unspecified: Secondary | ICD-10-CM | POA: Diagnosis not present

## 2023-10-24 LAB — BASIC METABOLIC PANEL
Anion gap: 8 (ref 5–15)
BUN: 10 mg/dL (ref 6–20)
CO2: 27 mmol/L (ref 22–32)
Calcium: 8.4 mg/dL — ABNORMAL LOW (ref 8.9–10.3)
Chloride: 108 mmol/L (ref 98–111)
Creatinine, Ser: 1.02 mg/dL — ABNORMAL HIGH (ref 0.44–1.00)
GFR, Estimated: >60 mL/min (ref >60)
Glucose, Bld: 94 mg/dL (ref 70–99)
Potassium: 4.1 mmol/L (ref 3.5–5.1)
Sodium: 143 mmol/L (ref 135–145)

## 2023-10-24 LAB — CBC WITH DIFFERENTIAL/PLATELET
Abs Immature Granulocytes: 0.01 10*3/uL (ref 0.00–0.07)
Basophils Absolute: 0 10*3/uL (ref 0.0–0.1)
Basophils Relative: 1 %
Eosinophils Absolute: 0.1 10*3/uL (ref 0.0–0.5)
Eosinophils Relative: 2 %
HCT: 36.2 % (ref 36.0–46.0)
Hemoglobin: 11.7 g/dL — ABNORMAL LOW (ref 12.0–15.0)
Immature Granulocytes: 0 %
Lymphocytes Relative: 48 %
Lymphs Abs: 1.6 10*3/uL (ref 0.7–4.0)
MCH: 27.9 pg (ref 26.0–34.0)
MCHC: 32.3 g/dL (ref 30.0–36.0)
MCV: 86.4 fL (ref 80.0–100.0)
Monocytes Absolute: 0.2 10*3/uL (ref 0.1–1.0)
Monocytes Relative: 7 %
Neutro Abs: 1.4 10*3/uL — ABNORMAL LOW (ref 1.7–7.7)
Neutrophils Relative %: 42 %
Platelets: 79 10*3/uL — ABNORMAL LOW (ref 150–400)
RBC: 4.19 MIL/uL (ref 3.87–5.11)
RDW: 17.3 % — ABNORMAL HIGH (ref 11.5–15.5)
WBC: 3.2 10*3/uL — ABNORMAL LOW (ref 4.0–10.5)
nRBC: 0 % (ref 0.0–0.2)

## 2023-10-24 MED ORDER — ASPIRIN 81 MG PO TBEC
81.0000 mg | DELAYED_RELEASE_TABLET | Freq: Every day | ORAL | 12 refills | Status: DC
  Filled 2023-10-24: qty 30, 30d supply, fill #0

## 2023-10-24 MED ORDER — ACETAMINOPHEN 325 MG PO TABS: 650.0000 mg | ORAL_TABLET | ORAL | Status: DC | PRN

## 2023-10-24 MED ORDER — SULFAMETHOXAZOLE-TRIMETHOPRIM 400-80 MG PO TABS
1.0000 | ORAL_TABLET | Freq: Every day | ORAL | 11 refills | Status: DC
  Filled 2023-10-24: qty 30, 30d supply, fill #0

## 2023-10-24 MED ORDER — ATORVASTATIN CALCIUM 40 MG PO TABS
40.0000 mg | ORAL_TABLET | Freq: Every day | ORAL | 11 refills | Status: DC
  Filled 2023-10-24: qty 30, 30d supply, fill #0

## 2023-10-24 MED ORDER — BICTEGRAVIR-EMTRICITAB-TENOFOV 50-200-25 MG PO TABS
1.0000 | ORAL_TABLET | Freq: Every day | ORAL | 11 refills | Status: DC
  Filled 2023-10-24: qty 30, 30d supply, fill #0

## 2023-10-24 MED ORDER — CLOPIDOGREL BISULFATE 75 MG PO TABS
75.0000 mg | ORAL_TABLET | Freq: Every day | ORAL | 0 refills | Status: AC
Start: 2023-10-24 — End: 2023-11-08
  Filled 2023-10-24: qty 15, 15d supply, fill #0

## 2023-10-24 NOTE — Discharge Summary (Signed)
 Physician Discharge Summary  Christina [name] ZDG:[id number] DOB: February 02, 1971 DOA: 10/19/2023  PCP: Pcp, No  Admit date: 10/19/2023 Discharge date: 10/24/2023  Time spent: 46 minutes  Recommendations for Outpatient Follow-up:  Requires CD4 count, viral load--infectious disease made aware to try to schedule follow-up outpatient for patient Was offered placement adamantly refused nursing home even ALF-she refused concerns previously for transitional care management refused to sign consents and caseworker Meryle Ready [phone] is a case worker to contact-[facility] caseworker to be assigned for patient as some concerns for neglect apparently--- I had an extensive discussion with the patient she has been offered all types of assistance and refuses to go to skilled because of lack of independence and willingness to pursue her own care--- we will offer her homeless shelter, [place] letter and supply of meds through [facility] health pharmacy Needs CBC differential Chem-7 additionally when able  Discharge Diagnoses:  MAIN problem for hospitalization   New stroke with acute ischemic CVA Uncontrolled HIV Depression anxiety Impaired glucose tolerance  Please see below for itemized issues addressed in HOpsital- refer to other progress notes for clarity if needed  Discharge Condition: Quite guarded and at very high risk for readmission  Diet recommendation: Heart healthy  Filed Weights   10/19/23 1241  Weight: 54 kg    History of present illness:  53 year old black female previously homeless-lives in motel Known HIV, chronic back pain, previous DVT 11/03/2016 status post mechanical thrombectomy at that time Known marijuana tobacco abuse, bipolar seen previously by psychiatry and admission to [facility] prior, HTN Prior admission for herpetic rash 04/2023 ?  Stroke 06/2023 per patient's report   Spent 3 days in the emergency room between 2/20 and 09/30/2023 awaiting placement-ultimately went to ex sister-in-law's  house was transferred to [facility]   10/15/2023 found negative motel oriented X4, found to have less pain droop to mouth and worsening speech-worsening right sided weakness additionally along with dysarthria CT head = small subacute acute strokes involving R frontal R parietal L posterior corpus callosum, MRI brain few subcentimeter acute to subacute ischemic infarcts right frontal temporoparietal lobes and left posterior corpus callosum Sodium 142 potassium 3.6 BUN/creatinine 5/0.9 WBC 2.9 platelet 160 THC positive alcohol level less than 10     Plan   Acute ischemic CVA Neurology recommending ASA 81 Plavix 75 x till 3/16 [21 d] then ASA lifelong Echo EF 60-65% RV overload?  Severely elevated PASP small pericardial effusion LDL 102 total cholesterol 332 Has been ambulating with moderate assistance with therapy needs standby assistance as is unsteady at rest and unable to tolerate standing without support and has a backward lean  Poor social circumstances Social work had extensive discussion with patient on several days-see their notes from 2/28 and 2/27-I reiterated my concerns I feel she has capacity to make decisions I have related to her our willingness to try to support her and get her help and even placement --- we have given her 48 hours in the hospital beyond acute stroke workup and have I believe we have done our due diligence to try to explain to her that discharge plan to the street is not a safe option She is still insistent she does not want to sign waiver check and wants her independence so social work will be contacted and we will give her medications if we can as well as walker and she can discharge either to the street or 2 of motel  HIV Previously was on Biktarvy /Bactrim not taking-patient resumed on meds CD4  count 2/26 was 38 and will need outpatient routine follow-up with ID as an outpatient I have reached out to ID to ensure that we have a follow-up set up We will  prescribe Biktarvy for HIV and Bactrim for toxo prophylaxis and she will need CD4 count and viral load probably in another 2 to 3 months if we can get her to be seen   Elevated D-dimer CT angio chest 2/25 no PE despite RV changes No further workup-Will need outpatient follow-up of echo once again follow-up with primary care   Depression anxiety Resume Lexapro 10 daily   Impaired glucose tolerance A1c 5.9 Will need patient discussion about meds and further follow-up as an outpatient   Discharge Exam: Vitals:   10/23/23 2336 10/24/23 0330  BP: (!) 128/100 (!) 128/99  Pulse: 83 76  Resp: 18 18  Temp: 98.1 F (36.7 C) (!) 97.5 F (36.4 C)  SpO2: 100% 100%    Subj on day of d/c   Eating breakfast upset that it is cold No real complaints but still quite dysarthric although is able to make herself understood  General Exam on discharge  Awake coherent no distress slight dysarthria able to verbalize well knows time date place person-speaking haltingly S1-S2 no murmur Chest is clear no wheeze rales rhonchi Abdomen soft no rebound Finger-nose-finger intact smile symmetric edentulous  Discharge Instructions    Allergies as of 10/24/2023   No Known Allergies      Medication List     STOP taking these medications    escitalopram 10 MG tablet Commonly known as: LEXAPRO   oxyCODONE-acetaminophen 5-325 MG tablet Commonly known as: PERCOCET/ROXICET       TAKE these medications    acetaminophen 325 MG tablet Commonly known as: TYLENOL Take 2 tablets (650 mg total) by mouth every 4 (four) hours as needed for mild pain (pain score 1-3) (or temp > 37.5 C (99.5 F)).   amLODipine 5 MG tablet Commonly known as: NORVASC Take 1 tablet (5 mg total) by mouth daily.   aspirin EC 81 MG tablet Take 1 tablet (81 mg total) by mouth daily. Swallow whole.   atorvastatin 40 MG tablet Commonly known as: LIPITOR Take 1 tablet (40 mg total) by mouth daily.    bictegravir-emtricitabine-tenofovir AF 50-200-25 MG Tabs tablet Commonly known as: BIKTARVY Take 1 tablet by mouth daily.   clopidogrel 75 MG tablet Commonly known as: PLAVIX Take 1 tablet (75 mg total) by mouth daily for 15 days.   sulfamethoxazole-trimethoprim 400-80 MG tablet Commonly known as: BACTRIM Take 1 tablet by mouth daily.       No Known Allergies    The results of significant diagnostics from this hospitalization (including imaging, microbiology, ancillary and laboratory) are listed below for reference.    Significant Diagnostic Studies: CT Angio Chest Pulmonary Embolism (PE) W or WO Contrast Result Date: 10/20/2023 CLINICAL DATA:  Shortness of breath, cough EXAM: CT ANGIOGRAPHY CHEST WITH CONTRAST TECHNIQUE: Multidetector CT imaging of the chest was performed using the standard protocol during bolus administration of intravenous contrast. Multiplanar CT image reconstructions and MIPs were obtained to evaluate the vascular anatomy. RADIATION DOSE REDUCTION: This exam was performed according to the departmental dose-optimization program which includes automated exposure control, adjustment of the mA and/or kV according to patient size and/or use of iterative reconstruction technique. CONTRAST:  75mL OMNIPAQUE IOHEXOL 350 MG/ML SOLN COMPARISON:  11/03/2016.  Chest x-ray today. FINDINGS: Cardiovascular: No filling defects in the pulmonary arteries to suggest pulmonary emboli.  Heart is normal size. Aorta is normal caliber. Mediastinum/Nodes: No mediastinal, hilar, or axillary adenopathy. Trachea and esophagus are unremarkable. Thyroid unremarkable. Lungs/Pleura: No confluent opacities or effusions. Upper Abdomen: No acute findings Musculoskeletal: Chest wall soft tissues are unremarkable. No acute bony abnormality. Review of the MIP images confirms the above findings. IMPRESSION: No evidence of pulmonary embolus No acute cardiopulmonary disease. Electronically Signed   By: Charlett Nose M.D.   On: 10/20/2023 22:14   DG CHEST PORT 1 VIEW Result Date: 10/20/2023 CLINICAL DATA:  Cough.  Stroke 4 days ago. EXAM: PORTABLE CHEST 1 VIEW COMPARISON:  04/28/2023 FINDINGS: Shallow inspiration. Heart size and pulmonary vascularity are normal for technique. Lungs are clear. No pleural effusion or pneumothorax. Mediastinal contours appear intact. Old fracture deformity of the right clavicle. IMPRESSION: No active disease. Electronically Signed   By: Burman Nieves M.D.   On: 10/20/2023 17:09   VAS [place] LOWER EXTREMITY VENOUS (DVT) Result Date: 10/20/2023  Lower Venous DVT Study Patient Name:  CRYSTINA BORRAYO  Date of Exam:   10/20/2023 Medical Rec #: [id number]       Accession #:    [id number] Date of Birth: Dec 24, 1970      Patient Gender: F Patient Age:   11 years Exam Location:  [facility] Procedure:      VAS [place] LOWER EXTREMITY VENOUS (DVT) Referring Phys: Leticia Penna --------------------------------------------------------------------------------  Indications: Stroke. 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 Comparison Study: 10/01/18 - Negative Performing Technologist: [place] Sink Sturdivant-Jones RDMS, RVT  Examination Guidelines: A complete evaluation includes B-mode imaging, spectral Doppler, color Doppler, and power Doppler as needed of all accessible portions of each vessel. Bilateral testing is considered an integral part of a complete examination. Limited examinations for reoccurring indications may be performed as noted. The reflux portion of the exam is performed with the patient in reverse Trendelenburg.  +---------+---------------+---------+-----------+----------+--------------+ RIGHT    CompressibilityPhasicitySpontaneityPropertiesThrombus Aging +---------+---------------+---------+-----------+----------+--------------+ CFV      Full            Yes      Yes                                 +---------+---------------+---------+-----------+----------+--------------+ SFJ      Full                                                        +---------+---------------+---------+-----------+----------+--------------+ FV Prox  Full                                                        +---------+---------------+---------+-----------+----------+--------------+ FV Mid   Full                                                        +---------+---------------+---------+-----------+----------+--------------+  FV DistalFull                                                        +---------+---------------+---------+-----------+----------+--------------+ PFV      Full                                                        +---------+---------------+---------+-----------+----------+--------------+ POP      Full           Yes      Yes                                 +---------+---------------+---------+-----------+----------+--------------+ PTV      Full                                                        +---------+---------------+---------+-----------+----------+--------------+ PERO     Full                                                        +---------+---------------+---------+-----------+----------+--------------+   +---------+---------------+---------+-----------+----------+--------------+ LEFT     CompressibilityPhasicitySpontaneityPropertiesThrombus Aging +---------+---------------+---------+-----------+----------+--------------+ CFV      Full           Yes      Yes                                 +---------+---------------+---------+-----------+----------+--------------+ SFJ      Full                                                        +---------+---------------+---------+-----------+----------+--------------+ FV Prox  Full                                                         +---------+---------------+---------+-----------+----------+--------------+ FV Mid   Full                                                        +---------+---------------+---------+-----------+----------+--------------+ FV DistalFull                                                        +---------+---------------+---------+-----------+----------+--------------+  PFV      Full                                                        +---------+---------------+---------+-----------+----------+--------------+ POP      Full           Yes      Yes                                 +---------+---------------+---------+-----------+----------+--------------+ PTV      Full                                                        +---------+---------------+---------+-----------+----------+--------------+ PERO     Full                                                        +---------+---------------+---------+-----------+----------+--------------+     Summary: BILATERAL: - No evidence of deep vein thrombosis seen in the lower extremities, bilaterally. -No evidence of popliteal cyst, bilaterally.   *See table(s) above for measurements and observations. Electronically signed by Lemar Livings MD on 10/20/2023 at 3:27:20 PM.    Final    ECHOCARDIOGRAM COMPLETE Result Date: 10/20/2023    ECHOCARDIOGRAM REPORT   Patient Name:   ISABELLY KOBLER Date of Exam: 10/20/2023 Medical Rec #:  [id number]      Height:       65.0 in Accession #:    [id number]     Weight:       119.0 lb Date of Birth:  [birth year]     BSA:          1.587 m Patient Age:    52 years       BP:           123/86 mmHg Patient Gender: F              HR:           77 bpm. Exam Location:  Inpatient Procedure: 2D Echo (Both Spectral and Color Flow Doppler were utilized during            procedure). Indications:    Stroke  History:        Patient has no prior history of Echocardiogram examinations. HIV                 HlO DVT  Substance abuse; Risk Factors:Hypertension and Current                 Smoker.  Sonographer:    Dondra Prader RVT RCS Referring Phys: [id number] TIMOTHY S OPYD IMPRESSIONS  1. Left ventricular ejection fraction, by estimation, is 60 to 65%. The left ventricle has normal function. The left ventricle has no regional wall motion abnormalities. Left ventricular diastolic parameters are consistent with Grade I diastolic dysfunction (impaired relaxation).  2. D-shaped interventricular septum suggestive of RV pressure/volume overload. Right ventricular systolic function is moderately reduced. The  right ventricular size is moderately enlarged. There is severely elevated pulmonary artery systolic pressure. The estimated right ventricular systolic pressure is 77.1 mmHg.  3. Right atrial size was moderately dilated.  4. The mitral valve is normal in structure. No evidence of mitral valve regurgitation. No evidence of mitral stenosis.  5. Tricuspid valve regurgitation is moderate.  6. The aortic valve is tricuspid. Aortic valve regurgitation is not visualized. No aortic stenosis is present.  7. The inferior vena cava is dilated in size with <50% respiratory variability, suggesting right atrial pressure of 15 mmHg.  8. A small pericardial effusion is present. FINDINGS  Left Ventricle: Left ventricular ejection fraction, by estimation, is 60 to 65%. The left ventricle has normal function. The left ventricle has no regional wall motion abnormalities. Strain imaging was not performed. The left ventricular internal cavity  size was normal in size. There is no left ventricular hypertrophy. Left ventricular diastolic parameters are consistent with Grade I diastolic dysfunction (impaired relaxation). Right Ventricle: D-shaped interventricular septum suggestive of RV pressure/volume overload. The right ventricular size is moderately enlarged. No increase in right ventricular wall thickness. Right ventricular systolic function is moderately  reduced. There is severely elevated pulmonary artery systolic pressure. The tricuspid regurgitant velocity is 3.94 m/s, and with an assumed right atrial pressure of 15 mmHg, the estimated right ventricular systolic pressure is 77.1 mmHg. Left Atrium: Left atrial size was normal in size. Right Atrium: Right atrial size was moderately dilated. Pericardium: A small pericardial effusion is present. Mitral Valve: The mitral valve is normal in structure. No evidence of mitral valve regurgitation. No evidence of mitral valve stenosis. Tricuspid Valve: The tricuspid valve is normal in structure. Tricuspid valve regurgitation is moderate. Aortic Valve: The aortic valve is tricuspid. Aortic valve regurgitation is not visualized. No aortic stenosis is present. Aortic valve mean gradient measures 1.0 mmHg. Aortic valve peak gradient measures 2.5 mmHg. Aortic valve area, by VTI measures 2.30 cm. Pulmonic Valve: The pulmonic valve was normal in structure. Pulmonic valve regurgitation is mild. Aorta: The aortic root is normal in size and structure. Venous: The inferior vena cava is dilated in size with less than 50% respiratory variability, suggesting right atrial pressure of 15 mmHg. IAS/Shunts: No atrial level shunt detected by color flow Doppler. Additional Comments: 3D imaging was not performed.  LEFT VENTRICLE PLAX 2D LVIDd:         3.10 cm   Diastology LVIDs:         2.20 cm   LV e' medial:    5.77 cm/s LV PW:         1.20 cm   LV E/e' medial:  6.8 LV IVS:        1.10 cm   LV e' lateral:   9.28 cm/s LVOT diam:     1.60 cm   LV E/e' lateral: 4.2 LV SV:         31 LV SV Index:   19 LVOT Area:     2.01 cm  RIGHT VENTRICLE            IVC RV Basal diam:  4.50 cm    IVC diam: 2.40 cm RV Mid diam:    4.00 cm RV S prime:     8.31 cm/s TAPSE (M-mode): 1.3 cm LEFT ATRIUM             Index        RIGHT ATRIUM           Index LA  Vol [facility]):   20.7 ml 13.05 ml/m  RA Area:     18.50 cm LA Vol (A4C):   27.1 ml 17.08 ml/m  RA Volume:    59.10 ml  37.25 ml/m LA Biplane Vol: 24.4 ml 15.38 ml/m  AORTIC VALVE                    PULMONIC VALVE AV Area (Vmax):    2.01 cm     PV Vmax:          0.41 m/s AV Area (Vmean):   2.06 cm     PV Peak grad:     0.7 mmHg AV Area (VTI):     2.30 cm     PR End Diast Vel: 10.37 msec AV Vmax:           78.40 cm/s AV Vmean:          49.900 cm/s AV VTI:            0.133 m AV Peak Grad:      2.5 mmHg AV Mean Grad:      1.0 mmHg LVOT Vmax:         78.50 cm/s LVOT Vmean:        51.200 cm/s LVOT VTI:          0.152 m LVOT/AV VTI ratio: 1.14  AORTA Ao Root diam: 3.30 cm Ao Asc diam:  3.00 cm MITRAL VALVE               TRICUSPID VALVE MV Area (PHT): 3.53 cm    TR Peak grad:   62.1 mmHg MV Decel Time: 215 msec    TR Vmax:        394.00 cm/s MV E velocity: 39.10 cm/s MV A velocity: 53.90 cm/s  SHUNTS MV E/A ratio:  0.73        Systemic VTI:  0.15 m                            Systemic Diam: 1.60 cm Dalton McleanMD Electronically signed by Wilfred Lacy Signature Date/Time: 10/20/2023/9:58:17 AM    Final    CT ANGIO HEAD NECK W WO CM Result Date: 10/20/2023 CLINICAL DATA:  Stroke, determine embolic source EXAM: CT ANGIOGRAPHY HEAD AND NECK WITH AND WITHOUT CONTRAST TECHNIQUE: Multidetector CT imaging of the head and neck was performed using the standard protocol during bolus administration of intravenous contrast. Multiplanar CT image reconstructions and MIPs were obtained to evaluate the vascular anatomy. Carotid stenosis measurements (when applicable) are obtained utilizing NASCET criteria, using the distal internal carotid diameter as the denominator. RADIATION DOSE REDUCTION: This exam was performed according to the departmental dose-optimization program which includes automated exposure control, adjustment of the mA and/or kV according to patient size and/or use of iterative reconstruction technique. CONTRAST:  75mL OMNIPAQUE IOHEXOL 350 MG/ML SOLN COMPARISON:  Brain MRI from yesterday FINDINGS: CT HEAD FINDINGS  Brain: Chronic small vessel infarcts underestimated relative to prior brain MRI, seen in the deep white matter and brainstem. Recent infarcts are occult when compared to MRI. No hemorrhage, hydrocephalus, or shift. Vascular: See below Skull: Normal. Negative for fracture or focal lesion. Sinuses/Orbits: No acute finding. Review of the MIP images confirms the above findings CTA NECK FINDINGS Aortic arch: Aberrant right subclavian artery. Right carotid system: Major vessels are smoothly contoured and widely patent without atheromatous change. Left carotid system: Major vessels are smoothly contoured and widely patent  without atheromatous change. Vertebral arteries: Major vessels are smoothly contoured and widely patent without atheromatous change. Skeleton: Generalized degeneration of the cervical spine. No acute or aggressive finding Other neck: Generous sized lymph nodes throughout the neck with homogeneous appearance. These may relate to patient's history of HIV. Upper chest: Dilated main pulmonary artery at 3.4 cm, usually indicating pulmonary Review of the MIP images confirms the above findings CTA HEAD FINDINGS Anterior circulation: No significant stenosis, proximal occlusion, aneurysm, or vascular malformation. Posterior circulation: Comparatively Fainter density of the right vertebral artery which may relate to relatively delayed filling from aberrant right subclavian artery with circuitous course. No branch occlusion, beading, or aneurysm. Venous sinuses: Not opacified on this arterial study. Anatomic variants: Hypoplastic right P1 segment. Review of the MIP images confirms the above findings IMPRESSION: 1. No emergent arterial finding. No major vessel irregularity or flow reducing stenosis. 2. Mildly enlarged lymph nodes throughout the neck, possibly related HIV status. In the absence of priors, recommend clinical or imaging follow-up for stability. Electronically Signed   By: Tiburcio Pea M.D.   On:  10/20/2023 05:39   MR Brain Wo Contrast (neuro protocol) Result Date: 10/19/2023 CLINICAL DATA:  Initial evaluation for acute neuro deficit, stroke suspected. EXAM: MRI HEAD WITHOUT CONTRAST TECHNIQUE: Multiplanar, multiecho pulse sequences of the brain and surrounding structures were obtained without intravenous contrast. COMPARISON:  Prior CT from earlier the same day. FINDINGS: Brain: Mild age-related cerebral atrophy. Patchy and confluent T2/FLAIR hyperintensity involving the periventricular and deep white matter both cerebral hemispheres as well as the pons, consistent with chronic small vessel ischemic disease, moderate to advanced in nature. Multiple superimposed remote lacunar infarcts present about the hemispheric cerebral white matter, deep gray nuclei, pons, and cerebellum. Few scattered subcentimeter foci of restricted diffusion are seen involving the right frontal and parietal lobes (series 5, images 66-63). Additional subcentimeter focus of diffusion signal abnormality noted in the region of the posterior left corpus callosum (series 5, image 62). Findings consistent with small evolving acute to early subacute ischemic infarcts. No associated hemorrhage or mass effect. No acute intracranial hemorrhage elsewhere. Few scattered chronic micro hemorrhages noted about the pons and left cerebral hemisphere, likely hypertensive in nature. No mass lesion, midline shift or mass effect. No hydrocephalus or extra-axial fluid collection. Pituitary gland and suprasellar region within normal limits. Vascular: Major intracranial vascular flow voids are maintained. Skull and upper cervical spine: Craniocervical junction within normal limits. Decreased T1 signal intensity noted within the visualized bone marrow, nonspecific, but most commonly related to anemia, smoking or obesity. No scalp soft tissue abnormality. Sinuses/Orbits: Globes and orbital soft tissues within normal limits. Mild mucosal thickening noted  about the ethmoidal air cells and maxillary sinuses. No significant mastoid effusion. Other: None. IMPRESSION: 1. Few scattered subcentimeter acute to early subacute ischemic infarcts involving the right frontal and parietal lobes as well as the left posterior corpus callosum. No associated hemorrhage or mass effect. 2. Underlying age-related cerebral atrophy with advanced chronic microvascular ischemic disease, with multiple remote lacunar infarcts involving the hemispheric cerebral white matter, deep gray nuclei, pons, and cerebellum. Electronically Signed   By: Rise Mu M.D.   On: 10/19/2023 20:51   CT Head Wo Contrast Result Date: 10/19/2023 CLINICAL DATA:  Neuro deficit, acute, stroke suspected. EXAM: CT HEAD WITHOUT CONTRAST TECHNIQUE: Contiguous axial images were obtained from the base of the skull through the vertex without intravenous contrast. RADIATION DOSE REDUCTION: This exam was performed according to the departmental dose-optimization program which includes  automated exposure control, adjustment of the mA and/or kV according to patient size and/or use of iterative reconstruction technique. COMPARISON:  Head CT 09/12/2023 FINDINGS: Brain: There is no evidence of an acute infarct, intracranial hemorrhage, mass, midline shift, or extra-axial fluid collection. Patchy hypodensities in the cerebral white matter bilaterally are similar to the prior CT and nonspecific but compatible with age advanced chronic small vessel ischemic disease with multiple chronic lacunar infarcts. There is unchanged mild cerebral atrophy. Vascular: No hyperdense vessel. Skull: No acute fracture or suspicious lesion. Sinuses/Orbits: Visualized paranasal sinuses and mastoid air cells are clear. Unremarkable orbits. Other: None. IMPRESSION: 1. No evidence of acute intracranial abnormality. 2. Age advanced chronic small vessel ischemic disease. Electronically Signed   By: Sebastian Ache M.D.   On: 10/19/2023 14:57     Microbiology: No results found for this or any previous visit (from the past 240 hours).   Labs: Basic Metabolic Panel: Recent Labs  Lab 10/20/23 0456 10/21/23 0615 10/22/23 0642 10/23/23 0717 10/24/23 0634  NA 143 142 141 142 143  K 3.5 3.7 3.6 3.9 4.1  CL 105 109 110 111 108  CO2 25 25 26 23 27   GLUCOSE 92 84 93 97 94  BUN 9 9 7 10 10   CREATININE 0.94 0.92 0.85 0.83 1.02*  CALCIUM 8.3* 8.1* 8.2* 8.1* 8.4*  MG 2.1 2.0  --   --   --   PHOS  --  3.4  --   --   --    Liver Function Tests: Recent Labs  Lab 10/19/23 1254 10/21/23 0615  AST 24 26  ALT 16 17  ALKPHOS 87 74  BILITOT 0.6 0.5  PROT 7.8 6.6  ALBUMIN 3.2* 2.5*   No results for input(s): "LIPASE", "AMYLASE" in the last 168 hours. No results for input(s): "AMMONIA" in the last 168 hours. CBC: Recent Labs  Lab 10/19/23 1254 10/20/23 0456 10/21/23 0615 10/22/23 0642 10/23/23 0717 10/24/23 0634  WBC 2.9* 2.7* 2.6* 3.1* 4.0 3.2*  NEUTROABS 1.4*  --  1.0* 1.3* 2.0 1.4*  HGB 14.0 13.6 12.4 12.0 12.3 11.7*  HCT 43.5 42.0 38.1 37.3 38.2 36.2  MCV 86.3 85.7 85.8 85.7 86.2 86.4  PLT 160 125* 88* 81* 74* 79*   Cardiac Enzymes: No results for input(s): "CKTOTAL", "CKMB", "CKMBINDEX", "TROPONINI" in the last 168 hours. BNP: BNP (last 3 results) No results for input(s): "BNP" in the last 8760 hours.  ProBNP (last 3 results) No results for input(s): "PROBNP" in the last 8760 hours.  CBG: No results for input(s): "GLUCAP" in the last 168 hours.  Signed:  Rhetta Mura MD   [facility] 10/24/2023, 9:02 AM

## 2023-10-24 NOTE — Plan of Care (Signed)
  Problem: Education: Goal: Knowledge of disease or condition will improve Outcome: Progressing   Problem: Self-Care: Goal: Ability to participate in self-care as condition permits will improve Outcome: Progressing   Problem: Nutrition: Goal: Risk of aspiration will decrease Outcome: Progressing Goal: Dietary intake will improve Outcome: Progressing

## 2023-10-25 ENCOUNTER — Emergency Department (HOSPITAL_COMMUNITY): Payer: MEDICAID

## 2023-10-25 ENCOUNTER — Other Ambulatory Visit: Payer: Self-pay

## 2023-10-25 ENCOUNTER — Encounter (HOSPITAL_COMMUNITY): Payer: Self-pay

## 2023-10-25 ENCOUNTER — Emergency Department (HOSPITAL_COMMUNITY)
Admission: EM | Admit: 2023-10-25 | Discharge: 2023-10-29 | Disposition: A | Discharge status: Home / Self Care | Payer: MEDICAID | Attending: Emergency Medicine | Admitting: Emergency Medicine

## 2023-10-25 DIAGNOSIS — R680 Hypothermia, not associated with low environmental temperature: Secondary | ICD-10-CM | POA: Insufficient documentation

## 2023-10-25 DIAGNOSIS — X31XXXA Exposure to excessive natural cold, initial encounter: Secondary | ICD-10-CM | POA: Insufficient documentation

## 2023-10-25 DIAGNOSIS — W19XXXA Unspecified fall, initial encounter: Secondary | ICD-10-CM

## 2023-10-25 DIAGNOSIS — R002 Palpitations: Secondary | ICD-10-CM | POA: Insufficient documentation

## 2023-10-25 DIAGNOSIS — M545 Low back pain, unspecified: Secondary | ICD-10-CM | POA: Insufficient documentation

## 2023-10-25 DIAGNOSIS — R262 Difficulty in walking, not elsewhere classified: Secondary | ICD-10-CM

## 2023-10-25 DIAGNOSIS — Z7982 Long term (current) use of aspirin: Secondary | ICD-10-CM | POA: Diagnosis not present

## 2023-10-25 DIAGNOSIS — T68XXXA Hypothermia, initial encounter: Secondary | ICD-10-CM

## 2023-10-25 DIAGNOSIS — Z7902 Long term (current) use of antithrombotics/antiplatelets: Secondary | ICD-10-CM | POA: Insufficient documentation

## 2023-10-25 DIAGNOSIS — R519 Headache, unspecified: Secondary | ICD-10-CM | POA: Diagnosis present

## 2023-10-25 DIAGNOSIS — Z5900 Homelessness unspecified: Secondary | ICD-10-CM | POA: Diagnosis not present

## 2023-10-25 LAB — URINALYSIS, W/ REFLEX TO CULTURE (INFECTION SUSPECTED)
Bilirubin Urine: NEGATIVE
Glucose, UA: NEGATIVE mg/dL
Hgb urine dipstick: NEGATIVE
Ketones, ur: NEGATIVE mg/dL
Leukocytes,Ua: NEGATIVE
Nitrite: NEGATIVE
Protein, ur: NEGATIVE mg/dL
Specific Gravity, Urine: 1.008 (ref 1.005–1.030)
pH: 7 (ref 5.0–8.0)

## 2023-10-25 LAB — COMPREHENSIVE METABOLIC PANEL
ALT: 16 U/L (ref 0–44)
AST: 27 U/L (ref 15–41)
Albumin: 3.1 g/dL — ABNORMAL LOW (ref 3.5–5.0)
Alkaline Phosphatase: 83 U/L (ref 38–126)
Anion gap: 10 (ref 5–15)
BUN: 7 mg/dL (ref 6–20)
CO2: 24 mmol/L (ref 22–32)
Calcium: 8.9 mg/dL (ref 8.9–10.3)
Chloride: 108 mmol/L (ref 98–111)
Creatinine, Ser: 0.77 mg/dL (ref 0.44–1.00)
GFR, Estimated: >60 mL/min (ref >60)
Glucose, Bld: 88 mg/dL (ref 70–99)
Potassium: 3.9 mmol/L (ref 3.5–5.1)
Sodium: 142 mmol/L (ref 135–145)
Total Bilirubin: 0.8 mg/dL (ref 0.0–1.2)
Total Protein: 7.9 g/dL (ref 6.5–8.1)

## 2023-10-25 LAB — CBC WITH DIFFERENTIAL/PLATELET
Abs Immature Granulocytes: 0.01 10*3/uL (ref 0.00–0.07)
Basophils Absolute: 0 10*3/uL (ref 0.0–0.1)
Basophils Relative: 1 %
Eosinophils Absolute: 0 10*3/uL (ref 0.0–0.5)
Eosinophils Relative: 1 %
HCT: 39.2 % (ref 36.0–46.0)
Hemoglobin: 12.8 g/dL (ref 12.0–15.0)
Immature Granulocytes: 0 %
Lymphocytes Relative: 21 %
Lymphs Abs: 0.9 10*3/uL (ref 0.7–4.0)
MCH: 28.3 pg (ref 26.0–34.0)
MCHC: 32.7 g/dL (ref 30.0–36.0)
MCV: 86.5 fL (ref 80.0–100.0)
Monocytes Absolute: 0.2 10*3/uL (ref 0.1–1.0)
Monocytes Relative: 5 %
Neutro Abs: 2.9 10*3/uL (ref 1.7–7.7)
Neutrophils Relative %: 72 %
Platelets: 97 10*3/uL — ABNORMAL LOW (ref 150–400)
RBC: 4.53 MIL/uL (ref 3.87–5.11)
RDW: 17.3 % — ABNORMAL HIGH (ref 11.5–15.5)
WBC: 4.1 10*3/uL (ref 4.0–10.5)
nRBC: 0 % (ref 0.0–0.2)

## 2023-10-25 LAB — I-STAT VENOUS BLOOD GAS, ED
Acid-Base Excess: 0 mmol/L (ref 0.0–2.0)
Bicarbonate: 26.6 mmol/L (ref 20.0–28.0)
Calcium, Ion: 1.14 mmol/L — ABNORMAL LOW (ref 1.15–1.40)
HCT: 39 % (ref 36.0–46.0)
Hemoglobin: 13.3 g/dL (ref 12.0–15.0)
O2 Saturation: 34 %
Potassium: 4 mmol/L (ref 3.5–5.1)
Sodium: 144 mmol/L (ref 135–145)
TCO2: 28 mmol/L (ref 22–32)
pCO2, Ven: 47.9 mmHg (ref 44–60)
pH, Ven: 7.352 (ref 7.25–7.43)
pO2, Ven: 22 mmHg — CL (ref 32–45)

## 2023-10-25 LAB — APTT: aPTT: 42 s — ABNORMAL HIGH (ref 24–36)

## 2023-10-25 LAB — RESP PANEL BY RT-PCR (RSV, FLU A&B, COVID)  RVPGX2
Influenza A by PCR: NEGATIVE
Influenza B by PCR: NEGATIVE
Resp Syncytial Virus by PCR: NEGATIVE
SARS Coronavirus 2 by RT PCR: NEGATIVE

## 2023-10-25 LAB — PROTIME-INR
INR: 1 (ref 0.8–1.2)
Prothrombin Time: 13.7 s (ref 11.4–15.2)

## 2023-10-25 LAB — I-STAT CHEM 8, ED
BUN: 8 mg/dL (ref 6–20)
Calcium, Ion: 1.13 mmol/L — ABNORMAL LOW (ref 1.15–1.40)
Chloride: 107 mmol/L (ref 98–111)
Creatinine, Ser: 0.8 mg/dL (ref 0.44–1.00)
Glucose, Bld: 82 mg/dL (ref 70–99)
HCT: 38 % (ref 36.0–46.0)
Hemoglobin: 12.9 g/dL (ref 12.0–15.0)
Potassium: 3.9 mmol/L (ref 3.5–5.1)
Sodium: 144 mmol/L (ref 135–145)
TCO2: 27 mmol/L (ref 22–32)

## 2023-10-25 LAB — I-STAT CG4 LACTIC ACID, ED
Lactic Acid, Venous: 2.4 mmol/L (ref 0.5–1.9)
Lactic Acid, Venous: 2 mmol/L (ref 0.5–1.9)
Lactic Acid, Venous: 1.9 mmol/L (ref 0.5–1.9)

## 2023-10-25 LAB — CK: Total CK: 111 U/L (ref 38–234)

## 2023-10-25 LAB — CBG MONITORING, ED: Glucose-Capillary: 91 mg/dL (ref 70–99)

## 2023-10-25 MED ORDER — ASPIRIN 81 MG PO TBEC: 81.0000 mg | DELAYED_RELEASE_TABLET | Freq: Every day | ORAL | Status: DC

## 2023-10-25 MED ORDER — ATORVASTATIN CALCIUM 40 MG PO TABS
40.0000 mg | ORAL_TABLET | Freq: Every day | ORAL | Status: DC
  Administered 2023-10-26 – 2023-10-29 (×4): 40 mg via ORAL
  Filled 2023-10-25 (×4): qty 1

## 2023-10-25 MED ORDER — CLOPIDOGREL BISULFATE 75 MG PO TABS: 75.0000 mg | ORAL_TABLET | Freq: Every day | ORAL | Status: DC

## 2023-10-25 MED ORDER — ACETAMINOPHEN 500 MG PO TABS
1000.0000 mg | ORAL_TABLET | Freq: Once | ORAL | Status: AC
  Administered 2023-10-25: 1000 mg via ORAL
  Filled 2023-10-25: qty 2

## 2023-10-25 MED ORDER — LACTATED RINGERS IV BOLUS
1000.0000 mL | Freq: Once | INTRAVENOUS | Status: AC
  Administered 2023-10-25: 1000 mL via INTRAVENOUS

## 2023-10-25 MED ORDER — CLOPIDOGREL BISULFATE 75 MG PO TABS
75.0000 mg | ORAL_TABLET | Freq: Every day | ORAL | Status: DC
  Administered 2023-10-25 – 2023-10-29 (×5): 75 mg via ORAL
  Filled 2023-10-25 (×5): qty 1

## 2023-10-25 MED ORDER — BICTEGRAVIR-EMTRICITAB-TENOFOV 50-200-25 MG PO TABS
1.0000 | ORAL_TABLET | Freq: Every day | ORAL | Status: DC
Start: 2023-10-26 — End: 2023-10-29
  Administered 2023-10-26 – 2023-10-29 (×4): 1 via ORAL
  Filled 2023-10-25 (×5): qty 1

## 2023-10-25 MED ORDER — LACTATED RINGERS IV BOLUS
2000.0000 mL | Freq: Once | INTRAVENOUS | Status: AC
  Administered 2023-10-25: 2000 mL via INTRAVENOUS

## 2023-10-25 MED ORDER — BICTEGRAVIR-EMTRICITAB-TENOFOV 50-200-25 MG PO TABS: 1.0000 | ORAL_TABLET | Freq: Every day | ORAL | Status: DC

## 2023-10-25 MED ORDER — ACETAMINOPHEN 325 MG PO TABS
650.0000 mg | ORAL_TABLET | Freq: Once | ORAL | Status: AC
  Administered 2023-10-25: 650 mg via ORAL
  Filled 2023-10-25: qty 2

## 2023-10-25 MED ORDER — ASPIRIN 81 MG PO TBEC
81.0000 mg | DELAYED_RELEASE_TABLET | Freq: Every day | ORAL | Status: DC
  Administered 2023-10-26 – 2023-10-29 (×4): 81 mg via ORAL
  Filled 2023-10-25 (×4): qty 1

## 2023-10-25 MED ORDER — SULFAMETHOXAZOLE-TRIMETHOPRIM 400-80 MG PO TABS
1.0000 | ORAL_TABLET | Freq: Every day | ORAL | Status: DC
  Administered 2023-10-26 – 2023-10-29 (×4): 1 via ORAL
  Filled 2023-10-25 (×5): qty 1

## 2023-10-25 MED ORDER — ATORVASTATIN CALCIUM 40 MG PO TABS: 40.0000 mg | ORAL_TABLET | Freq: Every day | ORAL | Status: DC

## 2023-10-25 MED ORDER — SULFAMETHOXAZOLE-TRIMETHOPRIM 400-80 MG PO TABS
1.0000 | ORAL_TABLET | Freq: Every day | ORAL | Status: DC
Start: 2023-10-25 — End: 2023-10-25

## 2023-10-25 NOTE — ED Notes (Signed)
 Bair Hugger placed on patient.

## 2023-10-25 NOTE — ED Triage Notes (Addendum)
 Pt arrives via [facility]. PT was found outside by a bystander. Pt reports headache for the past couple of weeks. Pt also reports back pain from a fall yesterday. Pt is AxOx4. Pt has a temp of 94.4 during triage.

## 2023-10-25 NOTE — ED Notes (Signed)
 Pt incontinent of urine, pt and linen changed

## 2023-10-25 NOTE — ED Provider Notes (Signed)
 [place] EMERGENCY DEPARTMENT AT [facility] Provider Note   CSN: [id number] Arrival date & time: 10/25/23  4098     History  Chief Complaint  Patient presents with   Headache   Cold Exposure    Telicia Melder is a 53 y.o. female.  53 year old female presents today for concern of headache and being found outside by EMS.  She also endorses having a fall since being discharged from the hospital yesterday.  Patient was recently noted for CVA.  She was offered SNF placement and other resources where she turned and eventually she decided that she wanted to be discharged to the streets.  She is homeless.  She states she lost balance last night causing her to fall.  No loss of consciousness.  Patient states she stayed outside the whole night.  Does endorse low back pain, as well as neck pain.  She states headache has been persistent since she left the hospital and it is not new.  The history is provided by the patient. No language interpreter was used.       Home Medications Prior to Admission medications   Medication Sig Start Date End Date Taking? Authorizing Provider  acetaminophen (TYLENOL) 325 MG tablet Take 2 tablets (650 mg total) by mouth every 4 (four) hours as needed for mild pain (pain score 1-3) (or temp > 37.5 C (99.5 F)). 10/24/23   Rhetta Mura, MD  amLODipine (NORVASC) 5 MG tablet Take 1 tablet (5 mg total) by mouth daily. Patient not taking: Reported on 09/02/2023 05/05/23 05/04/24  Joycelyn Das, MD  aspirin EC 81 MG tablet Take 1 tablet (81 mg total) by mouth daily. Swallow whole. 10/24/23   Rhetta Mura, MD  atorvastatin (LIPITOR) 40 MG tablet Take 1 tablet (40 mg total) by mouth daily. 10/24/23   Rhetta Mura, MD  bictegravir-emtricitabine-tenofovir AF (BIKTARVY) 50-200-25 MG TABS tablet Take 1 tablet by mouth daily. 10/24/23   Rhetta Mura, MD  clopidogrel (PLAVIX) 75 MG tablet Take 1 tablet (75 mg total) by mouth daily for 15 days.  10/24/23 11/08/23  Rhetta Mura, MD  sulfamethoxazole-trimethoprim (BACTRIM) 400-80 MG tablet Take 1 tablet by mouth daily. 10/24/23   Rhetta Mura, MD      Allergies    Patient has no known allergies.    Review of Systems   Review of Systems  Constitutional:  Positive for chills. Negative for fever.  Eyes:  Negative for visual disturbance.  Respiratory:  Negative for shortness of breath.   Gastrointestinal:  Negative for abdominal pain.  Musculoskeletal:  Positive for back pain.  Neurological:  Positive for headaches.  All other systems reviewed and are negative.   Physical Exam Updated Vital Signs BP (!) 118/96   Pulse (!) 105   Temp (S) (!) 93.4 F (34.1 C) (Rectal)   Resp 16   Ht 5\' 5"  (1.651 m)   Wt 52.2 kg   LMP 09/22/2018   SpO2 99%   BMI 19.14 kg/m  Physical Exam Vitals and nursing note reviewed.  Constitutional:      General: She is not in acute distress.    Appearance: Normal appearance. She is not ill-appearing.  HENT:     Head: Normocephalic and atraumatic.     Nose: Nose normal.  Eyes:     Conjunctiva/sclera: Conjunctivae normal.  Cardiovascular:     Rate and Rhythm: Regular rhythm. Tachycardia present.  Pulmonary:     Effort: Pulmonary effort is normal. No respiratory distress.     Breath  sounds: No wheezing.  Abdominal:     General: There is no distension.     Palpations: Abdomen is soft.     Tenderness: There is no abdominal tenderness. There is no guarding.  Musculoskeletal:        General: No deformity. Normal range of motion.     Cervical back: Normal range of motion.     Comments: Pain over the lumbar paraspinal muscles.  Spine well aligned.  Skin:    Findings: No rash.  Neurological:     Mental Status: She is alert.     ED Results / Procedures / Treatments   Labs (all labs ordered are listed, but only abnormal results are displayed) Labs Reviewed  RESP PANEL BY RT-PCR (RSV, FLU A&B, COVID)  RVPGX2  CULTURE, BLOOD  (ROUTINE X 2)  CULTURE, BLOOD (ROUTINE X 2)  COMPREHENSIVE METABOLIC PANEL  CBC WITH DIFFERENTIAL/PLATELET  PROTIME-INR  APTT  URINALYSIS, W/ REFLEX TO CULTURE (INFECTION SUSPECTED)  CBG MONITORING, ED  I-STAT CG4 LACTIC ACID, ED  I-STAT CHEM 8, ED  I-STAT VENOUS BLOOD GAS, ED    EKG None  Radiology No results found.  Procedures Procedures    Medications Ordered in ED Medications  lactated ringers bolus 2,000 mL (has no administration in time range)  acetaminophen (TYLENOL) tablet 650 mg (has no administration in time range)    ED Course/ Medical Decision Making/ A&P Clinical Course as of 10/25/23 1521  Sun Oct 25, 2023  1331 Potassium: 3.9 [AA]    Clinical Course User Index [AA] Marita Kansas, PA-C                                 Medical Decision Making Amount and/or Complexity of Data Reviewed Labs: ordered. Decision-making details documented in ED Course. Radiology: ordered.  Risk OTC drugs. Prescription drug management.   This patient presents to the ED for concern of headache, fall, hypothermia, this involves an extensive number of treatment options, and is a complaint that carries with it a high risk of complications and morbidity.  The differential diagnosis includes hypothermia, electrolyte derangement, infection, rhabdo, acute intracranial bleed   MDM: 53 year old female presents today for concern of headache And Being Found outside by EMS.  She Was Discharged Yesterday after Having a CVA.  Her hospital stay was complicated by placement.  She ultimately decided she wanted to be discharged and was discharged to the street due to her homelessness.  Extensive effort was made to have her placed during the admission.   Patient hypothermic at 94 degrees on arrival.   [facility], warm fluids, temp Foley was placed.   Chest x-ray, CT head, CT cervical spine, CT lumbar spine without acute concerns.   CBC without leukocytosis or anemia.  CMP unremarkable with  the exception of albumin of 3.1.  Respiratory panel negative.  Initially lactic acid was elevated at 2.4.  Improved to 2.0.  Will continue IV fluids and reevaluate.  VBG without acute concern.   Patient's temp improved to 97.4.  She is mentating at baseline.  No indication for admission at this time.  Patient states that she would like to be placed as opposed to being discharged to the street.  She states she is agreeable and will sign the paperwork.   TOC consult placed.  I discussed with social worker who will help facilitate placement.   Patient is otherwise medically cleared.  She will be a TOC  border.   Home meds ordered as reflected on most recent admission.     Additional history obtained: -Additional history obtained from recent admission -External records from outside source obtained and reviewed including: Chart review including previous notes, labs, imaging, consultation notes     Lab Tests: -I ordered, reviewed, and interpreted labs.   The pertinent results include:        Labs Reviewed  COMPREHENSIVE METABOLIC PANEL - Abnormal; Notable for the following components:      Result Value     Albumin 3.1 (*)      All other components within normal limits  CBC WITH DIFFERENTIAL/PLATELET - Abnormal; Notable for the following components:    RDW 17.3 (*)      Platelets 97 (*)      All other components within normal limits  APTT - Abnormal; Notable for the following components:    aPTT 42 (*)      All other components within normal limits  URINALYSIS, W/ REFLEX TO CULTURE (INFECTION SUSPECTED) - Abnormal; Notable for the following components:    Color, Urine STRAW (*)      Bacteria, UA RARE (*)      All other components within normal limits  I-STAT CG4 LACTIC ACID, ED - Abnormal; Notable for the following components:    Lactic Acid, Venous 2.4 (*)      All other components within normal limits  I-STAT CHEM 8, ED - Abnormal; Notable for the following components:    Calcium,  Ion 1.13 (*)      All other components within normal limits  I-STAT VENOUS BLOOD GAS, ED - Abnormal; Notable for the following components:    pO2, Ven 22 (*)      Calcium, Ion 1.14 (*)      All other components within normal limits  I-STAT CG4 LACTIC ACID, ED - Abnormal; Notable for the following components:    Lactic Acid, Venous 2.0 (*)      All other components within normal limits  RESP PANEL BY RT-PCR (RSV, FLU A&B, COVID)  RVPGX2  CULTURE, BLOOD (ROUTINE X 2)  CULTURE, BLOOD (ROUTINE X 2)  PROTIME-INR  CK  CBG MONITORING, ED  I-STAT CG4 LACTIC ACID, ED        EKG   EKG Interpretation Date/Time:           Sunday October 25 2023 10:02:06 EST Ventricular Rate:   68 PR Interval:                134 QRS Duration:              120 QT Interval:                480 QTC Calculation:511 R Axis:             15 3   Text Interpretation:Sinus rhythm IVCD, consider atypical RBBB Nonspecific T abnormalities, lateral leads No significant change since last tracing Confirmed by Jacalyn Lefevre [phone]) on 10/25/2023 11:00:18 AM               Imaging Studies ordered: I ordered imaging studies including chest x-ray, CT head, CT cervical spine, CT lumbar spine I independently visualized and interpreted imaging. I agree with the radiologist interpretation     Medicines ordered and prescription drug management:    Meds ordered this encounter  Medications   lactated ringers bolus 2,000 mL   acetaminophen (TYLENOL) tablet 650 mg   lactated ringers bolus 1,000 mL    -  I have reviewed the patients home medicines and have made adjustments as needed   Critical interventions Warm fluids, bear hugger for hypothermia   Social Determinants of Health:  Factors impacting patients care include: Homelessness     Reevaluation: After the interventions noted above, I reevaluated the patient and found that they have :improved   Co morbidities that complicate the patient evaluation      Past  Medical History:  Diagnosis Date   Anxiety     Back pain     DVT (deep venous thrombosis) (HCC)      left leg with stent            Dispostion: Patient placed as a TOC border for SNF placement.    Final Clinical Impression(s) / ED Diagnoses Final diagnoses:  None    Rx / DC Orders ED Discharge Orders     None         Marita Kansas, PA-C 10/25/23 1522    Jacalyn Lefevre, MD 10/26/23 [phone]

## 2023-10-25 NOTE — ED Notes (Signed)
 Pt found chewing food but spitting it back out again. Pt said that swallowing was a little difficult. Primary RN made aware.

## 2023-10-25 NOTE — ED Notes (Signed)
 Patient transported to CT

## 2023-10-25 NOTE — [place] FL2 (Signed)
 [place] MEDICAID FL2 LEVEL OF CARE FORM     IDENTIFICATION  Patient Name: Christina [name] Birthdate: [birth year] Sex: female Admission Date (Current Location): 10/25/2023  [facility] and [place] Number:  Producer, television/film/video and Address:  [facility], [address], [place], [place] [address]      Provider Number: [id number]  Attending Physician Name and Address:  Jacalyn Lefevre, MD  Relative Name and Phone Number:       Current Level of Care: Hospital Recommended Level of Care: Skilled Nursing Facility Prior Approval Number:   Date Approved/Denied:   PASRR Number: [id number]  Discharge Plan: SNF    Current Diagnoses: Patient Active Problem List   Diagnosis Date Noted   Prolonged QT interval 10/20/2023   Ischemic stroke (HCC) 10/19/2023   Herpes zoster 04/29/2023   Sepsis (HCC) 04/29/2023   HIV (human immunodeficiency virus infection) (HCC) 04/29/2023   Generalized weakness 04/29/2023   Falls 04/29/2023   Hyponatremia 04/29/2023   Hypokalemia 04/29/2023   Cellulitis 04/28/2023   MDD (major depressive disorder), single episode, severe , no psychosis (HCC) 10/21/2018   Edema, peripheral 10/03/2018   MDD (major depressive disorder), recurrent episode, severe (HCC) 09/22/2018   Lumbar herniated disc 09/30/2017   Hypertension 05/22/2017   Migraine without aura and with status migrainosus, not intractable 05/22/2017   Tachycardia 05/22/2017   Insomnia 03/04/2017   Chronic bilateral low back pain with bilateral sciatica 01/26/2017   Depression with anxiety 01/26/2017   DVT (deep venous thrombosis) (HCC) 11/03/2016    Orientation RESPIRATION BLADDER Height & Weight     Situation, Place, Time, Self  Normal Continent Weight: 115 lb (52.2 kg) Height:  5\' 5"  (165.1 cm)  BEHAVIORAL SYMPTOMS/MOOD NEUROLOGICAL BOWEL NUTRITION STATUS      Continent Diet (Regular)  AMBULATORY STATUS COMMUNICATION OF NEEDS Skin   Limited Assist Verbally Normal                        Personal Care Assistance Level of Assistance  Bathing, Feeding, Dressing Bathing Assistance: Limited assistance Feeding assistance: Limited assistance Dressing Assistance: Limited assistance     Functional Limitations Info  Sight, Speech, Hearing Sight Info: Adequate Hearing Info: Adequate Speech Info: Impaired    SPECIAL CARE FACTORS FREQUENCY  PT (By licensed PT), OT (By licensed OT)     PT Frequency: 5x weekly OT Frequency: 5x weekly            Contractures Contractures Info: Not present    Additional Factors Info  Code Status, Allergies Code Status Info: Full Code Allergies Info: No known allergies           Current Medications (10/25/2023):  This is the current hospital active medication list No current facility-administered medications for this encounter.   Current Outpatient Medications  Medication Sig Dispense Refill   acetaminophen (TYLENOL) 325 MG tablet Take 2 tablets (650 mg total) by mouth every 4 (four) hours as needed for mild pain (pain score 1-3) (or temp > 37.5 C (99.5 F)).     amLODipine (NORVASC) 5 MG tablet Take 1 tablet (5 mg total) by mouth daily. (Patient not taking: Reported on 09/02/2023) 30 tablet 2   aspirin EC 81 MG tablet Take 1 tablet (81 mg total) by mouth daily. Swallow whole. 30 tablet 12   atorvastatin (LIPITOR) 40 MG tablet Take 1 tablet (40 mg total) by mouth daily. 30 tablet 11   bictegravir-emtricitabine-tenofovir AF (BIKTARVY) 50-200-25 MG TABS tablet Take  1 tablet by mouth daily. 30 tablet 11   clopidogrel (PLAVIX) 75 MG tablet Take 1 tablet (75 mg total) by mouth daily for 15 days. 15 tablet 0   sulfamethoxazole-trimethoprim (BACTRIM) 400-80 MG tablet Take 1 tablet by mouth daily. 30 tablet 11     Discharge Medications: Please see discharge summary for a list of discharge medications.  Relevant Imaging Results:  Relevant Lab Results:   Additional Information SSN:[id number]  Inis Sizer,  LCSW

## 2023-10-25 NOTE — Progress Notes (Signed)
 CSW spoke with Amjad, PA via secure chat to discuss patient. Per PA, patient agreeable for SNF placement and is agreeable to forfeit her check to a facility if a bed offer is able to be obtained.  Patient has a [employer] social worker Meryle Ready @ [phone] that has been working with her in attempt to get her signed up for TCM (tailored care management services).  CSW will complete FL2 and fax patient's clinicals to [facility] facilities for review.  Edwin Dada, MSW, LCSW Transitions of Care  Clinical Social Worker II [phone]

## 2023-10-26 ENCOUNTER — Emergency Department (HOSPITAL_COMMUNITY): Payer: MEDICAID

## 2023-10-26 LAB — FACTOR 5 LEIDEN

## 2023-10-26 MED ORDER — IBUPROFEN 400 MG PO TABS
600.0000 mg | ORAL_TABLET | Freq: Once | ORAL | Status: AC
  Administered 2023-10-26: 600 mg via ORAL
  Filled 2023-10-26: qty 1

## 2023-10-26 NOTE — Progress Notes (Addendum)
 3:35pm: CSW spoke with Dylan at [facility] who states the business office interview was completed and that he will initiate insurance authorization now. Dylan to notify CSW once authorization is approved so patient can discharge to the facility.  11:20am: CSW received return call from patient's son Peyton Najjar. CSW informed Peyton Najjar of bed offer from [facility] and is agreeable to speak with business office staff.  CSW spoke with Dylan at [facility] to inform him of contact information for [place].  10:50am: CSW spoke with patient at bedside to present her with bed offer. Patient laying in bed with several bags of belongings. Patient overall pleasant but seems to be confused. Patient stated she was told by MD on 3W that she only had to stay at SNF for 20 days - CSW attempted several times to inform her that is not correct and she will need to stay at [facility] for 30 days. Patient states she needs to use bedside commode which was brought in by NT. Patient states she is agreeable to accept bed offer from [facility].  CSW attempted to reach patient's two daughters and son without success - voicemails were left requesting a return call.  Prior to patient admitting to the facility, she must do a brief interview with business office staff.  10:30am: CSW received bed offer from [facility].  8:20am: CSW received a notification from [facility] and [address] that states patient is being considered for a possible bed offer.  CSW spoke with Dylan at [facility] who states he will review patient further and notify CSW with updates as they become available.  Edwin Dada, MSW, LCSW Transitions of Care  Clinical Social Worker II [phone]

## 2023-10-26 NOTE — ED Notes (Signed)
 Total bed change completed at this time. Pt up to [facility] with assist of this RN. Pt give washcloths to clean herself. Ne diaper placed at this time. Pt encouraged to call out for assistance to [facility]. NADN, WCM.

## 2023-10-26 NOTE — Evaluation (Signed)
 Physical Therapy Evaluation Patient Details Name: Christina [name] MRN: [id number] DOB: October 15, 1970 Today's Date: 10/26/2023  History of Present Illness  Pt is a 53 y/o F admitted on 10/25/23 after concern of HA after being found outside by EMS; pt endorses a fall since d/c from hospital the day prior. (Pt recently admitted 10/19/23-10/24/23 for CVA; recommended pt d/c to SNF but pt refused so pt d/c to the streets as pt is homeless.) Pt's temp improved with bair hugger. PMH: CVA, anxiety, back pain, HIV  Clinical Impression  Pt seen for PT evaluation with pt agreeable. Pt presents with R hemiparesis & pt very fearful of falling, reporting hx of many falls. On this date, pt is able to ambulate with RUE HHA & min assist but pt holding to furniture throughout gait attempt with LUE despite PT encouraging her not to.  Pt would benefit from ongoing acute PT services to address strengthening, balance, endurance, & gait. Pt reporting she's willing to go to post acute rehab <3 hours therapy/day.      If plan is discharge home, recommend the following: Help with stairs or ramp for entrance;A lot of help with bathing/dressing/bathroom;A lot of help with walking and/or transfers;Assistance with cooking/housework   Can travel by private vehicle   Yes    Equipment Recommendations Other (comment) (defer to next venue)  Recommendations for Other Services       Functional Status Assessment Patient has had a recent decline in their functional status and demonstrates the ability to make significant improvements in function in a reasonable and predictable amount of time.     Precautions / Restrictions Precautions Precautions: Fall Precaution/Restrictions Comments: permissive HTN, right foot drag Restrictions Weight Bearing Restrictions Per Provider Order: No      Mobility  Bed Mobility Overal bed mobility: Needs Assistance Bed Mobility: Supine to Sit     Supine to sit: Used rails, Modified independent  (Device/Increase time), HOB elevated Sit to supine: Supervision, HOB elevated, Used rails (extra time to elevate RLE onto bed)        Transfers Overall transfer level: Needs assistance Equipment used: 1 person hand held assist Transfers: Sit to/from Stand Sit to Stand: Min assist           General transfer comment: STS from EOB with RUE HHA    Ambulation/Gait Ambulation/Gait assistance: Min assist Gait Distance (Feet): 14 Feet Assistive device: 1 person hand held assist   Gait velocity: decreased     General Gait Details: Pt constantly reaching/holding to furniture during gait despite PT encouraging pt to let go with LUE. Pt with wide BOS, decreased hip/knee flexion RLE during swing phase, decrease RLE dorsiflexion & decreased heel strike.  Stairs            Wheelchair Mobility     Tilt Bed    Modified Rankin (Stroke Patients Only)       Balance Overall balance assessment: Needs assistance, History of Falls Sitting-balance support: Feet supported, No upper extremity supported Sitting balance-Leahy Scale: Fair Sitting balance - Comments: able to doff socks sitting EOB without LOB   Standing balance support: Single extremity supported, Bilateral upper extremity supported, During functional activity Standing balance-Leahy Scale: Poor                               Pertinent Vitals/Pain Pain Assessment Pain Assessment: Faces Faces Pain Scale: Hurts a little bit Pain Location: back Pain Descriptors / Indicators: Discomfort Pain  Intervention(s): Limited activity within patient's tolerance, Monitored during session    Home Living Family/patient expects to be discharged to:: Shelter/Homeless                 Home Equipment: None      Prior Function Prior Level of Function : History of Falls (last six months)             Mobility Comments: Reports falling prior to this readmission.       Extremity/Trunk Assessment   Upper  Extremity Assessment Upper Extremity Assessment: RUE deficits/detail RUE Deficits / Details: decreased shoulder flexion    Lower Extremity Assessment Lower Extremity Assessment: RLE deficits/detail (BLE edema in feet (R>L)) RLE Deficits / Details: 3+/5 R hip flexion, 2/5 knee extension, 2/5 dorsiflexion, sensation to light touch & proprioception intact LLE Deficits / Details: pt reports decreased sensation in L foot       Communication   Communication Factors Affecting Communication: Reduced clarity of speech;Difficulty expressing self    Cognition Arousal: Alert Behavior During Therapy: Flat affect   PT - Cognitive impairments: No apparent impairments                       PT - Cognition Comments: follows simple commands with extra time during session   Following commands impaired: Follows one step commands with increased time     Cueing       General Comments      Exercises     Assessment/Plan    PT Assessment Patient needs continued PT services  PT Problem List Decreased strength;Decreased coordination;Decreased range of motion;Decreased activity tolerance;Decreased balance;Decreased mobility;Decreased knowledge of precautions;Decreased safety awareness;Decreased knowledge of use of DME;Impaired sensation       PT Treatment Interventions DME instruction;Balance training;Modalities;Neuromuscular re-education;Gait training;Stair training;Functional mobility training;Therapeutic exercise;Manual techniques;Therapeutic activities;Patient/family education    PT Goals (Current goals can be found in the Care Plan section)  Acute Rehab PT Goals Patient Stated Goal: get better PT Goal Formulation: With patient Time For Goal Achievement: 11/09/23 Potential to Achieve Goals: Good    Frequency Min 1X/week     Co-evaluation               AM-PAC PT "6 Clicks" Mobility  Outcome Measure Help needed turning from your back to your side while in a flat bed  without using bedrails?: None Help needed moving from lying on your back to sitting on the side of a flat bed without using bedrails?: A Little Help needed moving to and from a bed to a chair (including a wheelchair)?: A Little Help needed standing up from a chair using your arms (e.g., wheelchair or bedside chair)?: A Little Help needed to walk in hospital room?: A Lot Help needed climbing 3-5 steps with a railing? : A Lot 6 Click Score: 17    End of Session   Activity Tolerance: Patient tolerated treatment well Patient left: in bed;with bed alarm set (no call bell in room, door open) Nurse Communication: Mobility status PT Visit Diagnosis: Muscle weakness (generalized) (M62.81);Unsteadiness on feet (R26.81);Difficulty in walking, not elsewhere classified (R26.2);Other abnormalities of gait and mobility (R26.89);Hemiplegia and hemiparesis Hemiplegia - Right/Left: Right Hemiplegia - dominant/non-dominant: Dominant Hemiplegia - caused by: Cerebral infarction    Time: 1610-9604 PT Time Calculation (min) (ACUTE ONLY): 19 min   Charges:   PT Evaluation $PT Eval Low Complexity: 1 Low   PT General Charges $$ ACUTE PT VISIT: 1 Visit  Aleda Grana, PT, DPT 10/26/23, 10:28 AM   Sandi Mariscal 10/26/2023, 10:26 AM

## 2023-10-26 NOTE — ED Provider Notes (Signed)
 Emergency Medicine Observation Re-evaluation Note  Christina [name] is a 53 y.o. female, seen on rounds today.  Pt initially presented to the ED for complaints of Headache and Cold Exposure Currently, the patient is asleep.  Pt presented yesterday after cold exposure.  She was warmed and agrees to go to SNF.  She still has her temp foley in that was placed yesterday.  TOC consulted and FL2 completed.  Physical Exam  BP (!) 126/104 (BP Location: Right Arm)   Pulse 91   Temp 97.9 F (36.6 C) (Axillary)   Resp 15   Ht 5\' 5"  (1.651 m)   Wt 52.2 kg   LMP 09/22/2018   SpO2 99%   BMI 19.14 kg/m  Physical Exam General: asleep Cardiac: rr Lungs: clear Psych: calm  ED Course / MDM  EKG:EKG Interpretation Date/Time:  Sunday October 25 2023 10:02:06 EST Ventricular Rate:  68 PR Interval:  134 QRS Duration:  120 QT Interval:  480 QTC Calculation: 511 R Axis:   153  Text Interpretation: Sinus rhythm IVCD, consider atypical RBBB Nonspecific T abnormalities, lateral leads No significant change since last tracing Confirmed by Jacalyn Lefevre [phone]) on 10/25/2023 11:00:18 AM  I have reviewed the labs performed to date as well as medications administered while in observation.  Recent changes in the last 24 hours include TOC eval.  Plan  Current plan is for SNF placement.  I asked the nurse to remove the foley.  PT also consulted.    Jacalyn Lefevre, MD 10/26/23 [phone]

## 2023-10-26 NOTE — ED Notes (Signed)
 Foley d/c this morning

## 2023-10-27 MED ORDER — ACETAMINOPHEN 325 MG PO TABS
650.0000 mg | ORAL_TABLET | Freq: Four times a day (QID) | ORAL | Status: DC | PRN
Start: 2023-10-27 — End: 2023-10-29
  Administered 2023-10-27: 650 mg via ORAL
  Filled 2023-10-27: qty 2

## 2023-10-27 MED ORDER — ENOXAPARIN SODIUM 40 MG/0.4ML IJ SOSY
40.0000 mg | PREFILLED_SYRINGE | Freq: Every day | INTRAMUSCULAR | Status: DC
  Administered 2023-10-27 – 2023-10-29 (×3): 40 mg via SUBCUTANEOUS
  Filled 2023-10-27 (×3): qty 0.4

## 2023-10-27 NOTE — Progress Notes (Signed)
 Patient's insurance authorization remains pending at this time.  Edwin Dada, MSW, LCSW Transitions of Care  Clinical Social Worker II [phone]

## 2023-10-27 NOTE — ED Notes (Signed)
 Rep from [facility] in to evaluate patient. Patient calm, cooperative. No complaints.

## 2023-10-27 NOTE — ED Provider Notes (Signed)
 Emergency Medicine Observation Re-evaluation Note  Christina [name] is a 53 y.o. female, seen on rounds today.  Pt initially presented to the ED for complaints of Headache and Cold Exposure Currently, the patient is sitting in the bed.  Physical Exam  BP (!) 126/98   Pulse 93   Temp 97.8 F (36.6 C) (Oral)   Resp 16   Ht 5\' 5"  (1.651 m)   Wt 52.2 kg   LMP 09/22/2018   SpO2 97%   BMI 19.14 kg/m  Physical Exam General: Sitting in bed resting awaiting assistance to get to the bathroom Cardiac: No murmur on my exam Lungs: Clear bilaterally Psych: No agitation at this time  ED Course / MDM  EKG:EKG Interpretation Date/Time:  Sunday October 25 2023 10:02:06 EST Ventricular Rate:  68 PR Interval:  134 QRS Duration:  120 QT Interval:  480 QTC Calculation: 511 R Axis:   153  Text Interpretation: Sinus rhythm IVCD, consider atypical RBBB Nonspecific T abnormalities, lateral leads No significant change since last tracing Confirmed by Jacalyn Lefevre [phone]) on 10/25/2023 11:00:18 AM  I have reviewed the labs performed to date as well as medications administered while in observation.  Recent changes in the last 24 hours include none reported.  Plan  Current plan is for awaiting SNF placement.    Destenie Ingber, Canary Brim, MD 10/27/23 [phone]

## 2023-10-28 MED ORDER — LORAZEPAM 1 MG PO TABS
1.0000 mg | ORAL_TABLET | Freq: Once | ORAL | Status: AC
  Administered 2023-10-28: 1 mg via ORAL
  Filled 2023-10-28: qty 1

## 2023-10-28 NOTE — Progress Notes (Signed)
 Physical Therapy Treatment Patient Details Name: Christina [name] MRN: [id number] DOB: [birth year] Today's Date: 10/28/2023   History of Present Illness Pt is a 53 y/o F admitted on 10/25/23 after concern of HA after being found outside by EMS; Pt hypothermic. pt endorses a fall since d/c from hospital the day prior. (Pt recently admitted 10/19/23-10/24/23 for CVA; recommended pt d/c to SNF but pt refused so pt d/c to the streets as pt is homeless.) Pt's temp improved with bair hugger. PMH: CVA, anxiety, back pain, HIV    PT Comments  Pt making slow progress with mobility. Needs cues to focus on task at hand. Patient will benefit from continued inpatient follow up therapy, <3 hours/day.    If plan is discharge home, recommend the following: Help with stairs or ramp for entrance;A lot of help with bathing/dressing/bathroom;A lot of help with walking and/or transfers;Assistance with cooking/housework;Direct supervision/assist for medications management;Direct supervision/assist for financial management   Can travel by private vehicle     No  Equipment Recommendations  Other (comment) (defer to next venue)    Recommendations for Other Services       Precautions / Restrictions Precautions Precautions: Fall Precaution/Restrictions Comments: right foot drag Restrictions Weight Bearing Restrictions Per Provider Order: No     Mobility  Bed Mobility Overal bed mobility: Needs Assistance Bed Mobility: Supine to Sit, Sit to Supine     Supine to sit: Min assist, HOB elevated, Used rails Sit to supine: Min assist (extra time to elevate RLE onto bed)   General bed mobility comments: Assist to bring legs off of bed and elevate trunk into sitting. Assist to bring RLE back up into bed    Transfers Overall transfer level: Needs assistance Equipment used: 1 person hand held assist, Rollator (4 wheels) Transfers: Sit to/from Stand Sit to Stand: Min assist           General transfer comment:  Assist to power up and stabilize    Ambulation/Gait Ambulation/Gait assistance: Mod assist Gait Distance (Feet): 10 Feet Assistive device: Rollator (4 wheels) Gait Pattern/deviations: Step-to pattern, Decreased step length - right, Decreased dorsiflexion - right, Knee hyperextension - right, Wide base of support Gait velocity: decreased Gait velocity interpretation: <1.31 ft/sec, indicative of household ambulator   General Gait Details: Assist for balance and support as well as assist to bring RLE forward to incr step length   Stairs             Wheelchair Mobility     Tilt Bed    Modified Rankin (Stroke Patients Only)       Balance Overall balance assessment: Needs assistance, History of Falls Sitting-balance support: Feet supported, No upper extremity supported Sitting balance-Leahy Scale: Fair     Standing balance support: Single extremity supported, Bilateral upper extremity supported, During functional activity Standing balance-Leahy Scale: Poor Standing balance comment: UE support and min assist for static standing                            Communication Communication Communication: Impaired Factors Affecting Communication: Reduced clarity of speech;Difficulty expressing self  Cognition Arousal: Alert Behavior During Therapy: Flat affect   PT - Cognitive impairments: No family/caregiver present to determine baseline, Memory, Attention, Safety/Judgement, Problem solving                       PT - Cognition Comments: Pt repeatedly stating she hadn't had food all day. Lunch tray  present which pt had eaten portions of. Vervbal/tactile cues to attend to task.   Following commands impaired: Follows one step commands with increased time    Cueing Cueing Techniques: Verbal cues, Tactile cues  Exercises      General Comments        Pertinent Vitals/Pain Pain Assessment Pain Assessment: No/denies pain    Home Living                           Prior Function            PT Goals (current goals can now be found in the care plan section) Acute Rehab PT Goals Patient Stated Goal: get better Progress towards PT goals: Progressing toward goals    Frequency    Min 1X/week      PT Plan      Co-evaluation              AM-PAC PT "6 Clicks" Mobility   Outcome Measure  Help needed turning from your back to your side while in a flat bed without using bedrails?: A Little Help needed moving from lying on your back to sitting on the side of a flat bed without using bedrails?: A Little Help needed moving to and from a bed to a chair (including a wheelchair)?: A Little Help needed standing up from a chair using your arms (e.g., wheelchair or bedside chair)?: A Little Help needed to walk in hospital room?: A Lot Help needed climbing 3-5 steps with a railing? : Total 6 Click Score: 15    End of Session   Activity Tolerance: Patient tolerated treatment well Patient left: in bed;with bed alarm set (no call bell in room, door open) Nurse Communication: Mobility status PT Visit Diagnosis: Muscle weakness (generalized) (M62.81);Unsteadiness on feet (R26.81);Difficulty in walking, not elsewhere classified (R26.2);Other abnormalities of gait and mobility (R26.89);Hemiplegia and hemiparesis Hemiplegia - Right/Left: Right Hemiplegia - dominant/non-dominant: Dominant Hemiplegia - caused by: Cerebral infarction     Time: 8119-1478 PT Time Calculation (min) (ACUTE ONLY): 15 min  Charges:    $Gait Training: 8-22 mins PT General Charges $$ ACUTE PT VISIT: 1 Visit                     [facility] PT Acute Rehabilitation Services Office [phone]    Angelina Ok [facility] 10/28/2023, 3:35 PM

## 2023-10-28 NOTE — ED Provider Notes (Signed)
 Emergency Medicine Observation Re-evaluation Note  Christina [name] is a 53 y.o. female, seen on rounds today.  Pt initially presented to the ED for complaints of Headache and Cold Exposure Currently, the patient is sleeping.  Physical Exam  BP (!) 138/105 (BP Location: Left Arm)   Pulse 97   Temp 97.9 F (36.6 C) (Oral)   Resp 20   Ht 5\' 5"  (1.651 m)   Wt 52.2 kg   LMP 09/22/2018   SpO2 98%   BMI 19.14 kg/m  Physical Exam General: No acute distress Cardiac: Normal rate Lungs: No increased work of breathing Psych: Calm  ED Course / MDM  EKG:EKG Interpretation Date/Time:  Sunday October 25 2023 10:02:06 EST Ventricular Rate:  68 PR Interval:  134 QRS Duration:  120 QT Interval:  480 QTC Calculation: 511 R Axis:   153  Text Interpretation: Sinus rhythm IVCD, consider atypical RBBB Nonspecific T abnormalities, lateral leads No significant change since last tracing Confirmed by Jacalyn Lefevre [phone]) on 10/25/2023 11:00:18 AM  I have reviewed the labs performed to date as well as medications administered while in observation.  Recent changes in the last 24 hours include none.  Plan  Current plan is for SNF placement.    Rolan Bucco, MD 10/28/23 [phone]

## 2023-10-28 NOTE — Progress Notes (Addendum)
 12:30pm: CSW received call from Dylan at [facility] who states patient's insurance authorization has been approved and the facility can accept patient at the facility tomorrow at 12pm.  11:15am: Patient's insurance authorization remains pending at this time  Edwin Dada, MSW, LCSW Transitions of Care  Clinical Social Worker II [phone]

## 2023-10-29 NOTE — ED Notes (Signed)
 Attempted to call admission report to [facility], staff states that nurse is in bed meeting and to callback after 1030.

## 2023-10-29 NOTE — ED Notes (Signed)
 Attempted to call admission report, staff states they paged nurse x 2 and there was no answer.

## 2023-10-29 NOTE — ED Notes (Signed)
 Report to Judeth Cornfield, Charity fundraiser at [employer].

## 2023-10-29 NOTE — ED Provider Notes (Signed)
 Emergency Medicine Observation Re-evaluation Note  Christina [name] is a 53 y.o. female, seen on rounds today.  Pt initially presented to the ED for complaints of Headache and Cold Exposure Currently, the patient is resting comfortably in NAD.  Physical Exam  BP (!) 138/105 (BP Location: Left Arm)   Pulse 97   Temp 97.9 F (36.6 C) (Oral)   Resp 20   Ht 5\' 5"  (1.651 m)   Wt 52.2 kg   LMP 09/22/2018   SpO2 98%   BMI 19.14 kg/m  Physical Exam General: Appears to be resting comfortably in bed, no acute distress. Cardiac: Regular rate, normal heart rate, non-emergent blood pressure for this morning's vitals. Lungs: No increased work of breathing.  Equal chest rise appreciated Psych: Calm, asleep in bed.   ED Course / MDM  EKG:EKG Interpretation Date/Time:  Sunday October 25 2023 10:02:06 EST Ventricular Rate:  68 PR Interval:  134 QRS Duration:  120 QT Interval:  480 QTC Calculation: 511 R Axis:   153  Text Interpretation: Sinus rhythm IVCD, consider atypical RBBB Nonspecific T abnormalities, lateral leads No significant change since last tracing Confirmed by Jacalyn Lefevre [phone]) on 10/25/2023 11:00:18 AM  I have reviewed the labs performed to date as well as medications administered while in observation.  Recent changes in the last 24 hours include .  Plan  Since their initial presentation, this patient has been evaluated by psychiatry and deemed to not have an emergent psychiatric condition.  Psychiatry has recommended Transitions of Care team consultation for arrangement for safe disposition planning which is underway at this time.  Appreciate recommendations on disposition from [facility] team.     Glyn Ade, MD 10/29/23 [phone]

## 2023-10-29 NOTE — Progress Notes (Signed)
 Patient will go to [facility] in [place] via [facility] - RN to call for pick up at 12pm. The number to call for report is [phone]  CSW spoke with patient's daughter [name] to inform her of discharge plan - she is agreeable and will see patient at the facility once she arrives.  Edwin Dada, MSW, LCSW Transitions of Care  Clinical Social Worker II [phone]

## 2023-10-30 ENCOUNTER — Ambulatory Visit: Payer: Self-pay | Admitting: Nurse Practitioner

## 2023-10-30 LAB — CULTURE, BLOOD (ROUTINE X 2)
Culture: NO GROWTH
Culture: NO GROWTH

## 2023-11-06 ENCOUNTER — Telehealth: Payer: Self-pay

## 2023-11-06 ENCOUNTER — Inpatient Hospital Stay: Payer: MEDICAID | Admitting: Infectious Diseases

## 2023-11-06 NOTE — Telephone Encounter (Addendum)
 [facility] is currently [facility] [phone]). Called to reschedule appointment, facility scheduler Aggie Cosier did not answer left voicemail to contact office.

## 2023-11-11 ENCOUNTER — Telehealth: Payer: Self-pay

## 2023-11-11 ENCOUNTER — Other Ambulatory Visit (HOSPITAL_COMMUNITY): Payer: Self-pay

## 2023-11-11 NOTE — Telephone Encounter (Signed)
 Pharmacy Patient Advocate Encounter  Insurance verification completed.   The patient is insured through [employer] [place]   Ran test claim for [employer]. Currently a quantity of 30 is a 30 day supply and the co-pay is $0.00 . Last filled on 10/24/23  This test claim was processed through [facility]- copay amounts may vary at other pharmacies due to pharmacy/plan contracts, or as the patient moves through the different stages of their insurance plan.

## 2023-11-19 ENCOUNTER — Telehealth: Payer: Self-pay

## 2023-11-19 ENCOUNTER — Inpatient Hospital Stay: Payer: MEDICAID | Admitting: Family

## 2023-11-19 NOTE — Telephone Encounter (Signed)
 Called [facility] [place], [place] to reschedule missed appointment. Spoke with staff who states patient was discharged by them on 3/22 and admitted at [facility] in [place], [place] where she passed away.  Left Vm with staff at [facility] to confirm this so chart can be updated.  Juanita Laster, RMA

## 2023-11-19 NOTE — Progress Notes (Deleted)
 Brief Narrative   Patient ID: Christina [name] [name], female    DOB: 03/06/1971, 53 y.o.   MRN: [id number]    Subjective:    No chief complaint on file.   HPI:  Christina [name] [name] is a 53 y.o. female with HIV disease last seen on during hospitalization in September 2024 with newly diagnosed HIV disease in the setting of shingles outbreak. Initial viral load was 162,000 and CD4 count <35. Risk factor was heterosexual contact. Started on Biktarvy and Bactrim for OI prophylaxis. Has not been seen in the office. Recently hospitalized for acute CVA and was not on medication and was resumed. Lab work showed CD4 count 38 and was prescribed Biktravy and Bactrim. She refused any assisted living facility at discharge with history of homelessness. Returned to ED on 3/2/ with headache and cold exposure. Following psychiatry clearance she was discharged to a skilled facility.     Denies fevers, chills, night sweats, headaches, changes in vision, neck pain/stiffness, nausea, diarrhea, vomiting, lesions or rashes.  Lab Results  Component Value Date   CD4TCELL 3 (L) 10/21/2023   CD4TABS 38 (L) 10/21/2023   Lab Results  Component Value Date   HIV1RNAQUANT 367,000 10/20/2023     No Known Allergies    Outpatient Medications Prior to Visit  Medication Sig Dispense Refill   acetaminophen (TYLENOL) 325 MG tablet Take 2 tablets (650 mg total) by mouth every 4 (four) hours as needed for mild pain (pain score 1-3) (or temp > 37.5 C (99.5 F)).     aspirin EC 81 MG tablet Take 1 tablet (81 mg total) by mouth daily. Swallow whole. (Patient not taking: Reported on 10/26/2023) 30 tablet 12   atorvastatin (LIPITOR) 40 MG tablet Take 1 tablet (40 mg total) by mouth daily. (Patient not taking: Reported on 10/26/2023) 30 tablet 11   bictegravir-emtricitabine-tenofovir AF (BIKTARVY) 50-200-25 MG TABS tablet Take 1 tablet by mouth daily. (Patient not taking: Reported on 10/26/2023) 30 tablet 11    sulfamethoxazole-trimethoprim (BACTRIM) 400-80 MG tablet Take 1 tablet by mouth daily. (Patient not taking: Reported on 10/26/2023) 30 tablet 11   No facility-administered medications prior to visit.     Past Medical History:  Diagnosis Date   Anxiety    Back pain    DVT (deep venous thrombosis) (HCC)    left leg with stent     Past Surgical History:  Procedure Laterality Date   CORONARY ULTRASOUND/IVUS Left 11/03/2016   Procedure: Intravascular Ultrasound/IVUS;  Surgeon: Maeola Harman, MD;  Location: [facility] INVASIVE CV LAB;  Service: Cardiovascular;  Laterality: Left;   LOWER EXTREMITY VENOGRAPHY Left 11/04/2016   Procedure: Lower Extremity Venography;  Surgeon: Nada Libman, MD;  Location: [facility] INVASIVE CV LAB;  Service: Cardiovascular;  Laterality: Left;   PERIPHERAL VASCULAR INTERVENTION Left 11/04/2016   Procedure: Peripheral Vascular Intervention;  Surgeon: Nada Libman, MD;  Location: [facility] INVASIVE CV LAB;  Service: Cardiovascular;  Laterality: Left;  common external   PERIPHERAL VASCULAR THROMBECTOMY Left 11/03/2016   Procedure: Peripheral Vascular Thrombectomy;  Surgeon: Maeola Harman, MD;  Location: [facility] INVASIVE CV LAB;  Service: Cardiovascular;  Laterality: Left;      Review of Systems    Objective:    LMP 09/22/2018  Nursing note and vital signs reviewed.  Physical Exam      02/04/2019   11:23 AM 01/27/2019   10:24 AM 01/14/2019    4:00 PM 01/07/2019    4:46 PM 12/31/2018   11:46 AM  Depression screen  PHQ 2/9  Decreased Interest  1 0 1 0  Down, Depressed, Hopeless 1 0 0 1 0  PHQ - 2 Score 1 1 0 2 0  Altered sleeping 3 2 3 3 3   Tired, decreased energy 1 1 3 3 1   Change in appetite 0 0 0 0 0  Feeling bad or failure about yourself  0 0 0 1 1  Trouble concentrating 0 1 0 1 1  Moving slowly or fidgety/restless 0 0 1 0 0  Suicidal thoughts 0 0 0 0 0  PHQ-9 Score 5 5 7 10 6         10/13/2017    2:46 PM 10/13/2017    2:34 PM 08/13/2017     9:46 AM 11/11/2016    4:59 PM  GAD 7 : Generalized Anxiety Score  Nervous, Anxious, on Edge 3 3 2 3   Control/stop worrying 3 3 3 3   Worry too much - different things 3 3 3 3   Trouble relaxing 3 3 3 3   Restless 2 2 2 2   Easily annoyed or irritable 3 3 3 3   Afraid - awful might happen 3 3 3 3   Total GAD 7 Score 20 20 19 20   Anxiety Difficulty    Somewhat difficult         Assessment & Plan:    Patient Active Problem List   Diagnosis Date Noted   Prolonged QT interval 10/20/2023   Ischemic stroke (HCC) 10/19/2023   Herpes zoster 04/29/2023   Sepsis (HCC) 04/29/2023   HIV (human immunodeficiency virus infection) (HCC) 04/29/2023   Generalized weakness 04/29/2023   Falls 04/29/2023   Hyponatremia 04/29/2023   Hypokalemia 04/29/2023   Cellulitis 04/28/2023   MDD (major depressive disorder), single episode, severe , no psychosis (HCC) 10/21/2018   Edema, peripheral 10/03/2018   MDD (major depressive disorder), recurrent episode, severe (HCC) 09/22/2018   Lumbar herniated disc 09/30/2017   Hypertension 05/22/2017   Migraine without aura and with status migrainosus, not intractable 05/22/2017   Tachycardia 05/22/2017   Insomnia 03/04/2017   Chronic bilateral low back pain with bilateral sciatica 01/26/2017   Depression with anxiety 01/26/2017   DVT (deep venous thrombosis) (HCC) 11/03/2016     Problem List Items Addressed This Visit   None    I am having [name] maintain her acetaminophen, bictegravir-emtricitabine-tenofovir AF, sulfamethoxazole-trimethoprim, atorvastatin, and aspirin EC.   No orders of the defined types were placed in this encounter.    Follow-up: No follow-ups on file. or sooner if needed.    Marcos Eke, MSN, FNP-C Nurse Practitioner [facility] Main number: [phone]

## 2024-03-09 ENCOUNTER — Telehealth: Payer: Self-pay

## 2024-03-09 NOTE — Telephone Encounter (Signed)
 Spoke with representative at [facility] in [place], [place]. Confirmed that patient has passed away, no DOD available.   Noga Fogg, BSN, RN
